# Patient Record
Sex: Female | Born: 1955 | Race: Black or African American | Hispanic: No | Smoking: Former smoker
Health system: Southern US, Community
[De-identification: ages and names within clinical notes are randomized; demographics above are authoritative.]

## PROBLEM LIST (undated history)

## (undated) DIAGNOSIS — B019 Varicella without complication: Secondary | ICD-10-CM

## (undated) DIAGNOSIS — I10 Essential (primary) hypertension: Secondary | ICD-10-CM

## (undated) DIAGNOSIS — C801 Malignant (primary) neoplasm, unspecified: Secondary | ICD-10-CM

## (undated) HISTORY — PX: FRACTURE SURGERY: SHX138

## (undated) HISTORY — PX: SPINE SURGERY: SHX786

## (undated) HISTORY — DX: Essential (primary) hypertension: I10

## (undated) HISTORY — DX: Varicella without complication: B01.9

## (undated) HISTORY — PX: PARTIAL HYSTERECTOMY: SHX80

## (undated) HISTORY — PX: OTHER SURGICAL HISTORY: SHX169

---

## 2015-11-16 DIAGNOSIS — S82832A Other fracture of upper and lower end of left fibula, initial encounter for closed fracture: Secondary | ICD-10-CM | POA: Diagnosis not present

## 2015-11-16 DIAGNOSIS — S8265XA Nondisplaced fracture of lateral malleolus of left fibula, initial encounter for closed fracture: Secondary | ICD-10-CM | POA: Diagnosis not present

## 2015-11-16 DIAGNOSIS — M25572 Pain in left ankle and joints of left foot: Secondary | ICD-10-CM | POA: Diagnosis not present

## 2015-11-16 DIAGNOSIS — M7989 Other specified soft tissue disorders: Secondary | ICD-10-CM | POA: Diagnosis not present

## 2015-11-16 DIAGNOSIS — S24151A Other incomplete lesion at T1 level of thoracic spinal cord, initial encounter: Secondary | ICD-10-CM | POA: Diagnosis not present

## 2015-12-04 DIAGNOSIS — S82445A Nondisplaced spiral fracture of shaft of left fibula, initial encounter for closed fracture: Secondary | ICD-10-CM | POA: Diagnosis not present

## 2015-12-04 DIAGNOSIS — M25472 Effusion, left ankle: Secondary | ICD-10-CM | POA: Diagnosis not present

## 2015-12-04 DIAGNOSIS — M25572 Pain in left ankle and joints of left foot: Secondary | ICD-10-CM | POA: Diagnosis not present

## 2015-12-04 DIAGNOSIS — S8265XD Nondisplaced fracture of lateral malleolus of left fibula, subsequent encounter for closed fracture with routine healing: Secondary | ICD-10-CM | POA: Diagnosis not present

## 2016-01-04 DIAGNOSIS — S82832D Other fracture of upper and lower end of left fibula, subsequent encounter for closed fracture with routine healing: Secondary | ICD-10-CM | POA: Diagnosis not present

## 2016-01-04 DIAGNOSIS — S82832A Other fracture of upper and lower end of left fibula, initial encounter for closed fracture: Secondary | ICD-10-CM | POA: Diagnosis not present

## 2016-01-04 DIAGNOSIS — S24151A Other incomplete lesion at T1 level of thoracic spinal cord, initial encounter: Secondary | ICD-10-CM | POA: Diagnosis not present

## 2016-01-10 DIAGNOSIS — N319 Neuromuscular dysfunction of bladder, unspecified: Secondary | ICD-10-CM | POA: Diagnosis not present

## 2016-01-10 DIAGNOSIS — R252 Cramp and spasm: Secondary | ICD-10-CM | POA: Diagnosis not present

## 2016-01-10 DIAGNOSIS — S24151A Other incomplete lesion at T1 level of thoracic spinal cord, initial encounter: Secondary | ICD-10-CM | POA: Diagnosis not present

## 2016-01-10 DIAGNOSIS — M792 Neuralgia and neuritis, unspecified: Secondary | ICD-10-CM | POA: Diagnosis not present

## 2017-04-02 DIAGNOSIS — R252 Cramp and spasm: Secondary | ICD-10-CM | POA: Diagnosis not present

## 2017-04-02 DIAGNOSIS — S24151A Other incomplete lesion at T1 level of thoracic spinal cord, initial encounter: Secondary | ICD-10-CM | POA: Diagnosis not present

## 2017-04-02 DIAGNOSIS — I10 Essential (primary) hypertension: Secondary | ICD-10-CM | POA: Diagnosis not present

## 2017-04-02 DIAGNOSIS — Z23 Encounter for immunization: Secondary | ICD-10-CM | POA: Diagnosis not present

## 2017-04-02 DIAGNOSIS — F321 Major depressive disorder, single episode, moderate: Secondary | ICD-10-CM | POA: Diagnosis not present

## 2017-04-02 DIAGNOSIS — M792 Neuralgia and neuritis, unspecified: Secondary | ICD-10-CM | POA: Diagnosis not present

## 2017-04-02 DIAGNOSIS — Z720 Tobacco use: Secondary | ICD-10-CM | POA: Diagnosis not present

## 2017-11-26 ENCOUNTER — Telehealth: Payer: Self-pay | Admitting: General Practice

## 2017-11-26 NOTE — Telephone Encounter (Signed)
The [facility] called asking if Dr. Yong Channel would still be able to see this patient, as the patient is Medicare and Dr. Yong Channel does not take Medicare patients unless by close family/ friend referral, or the patient is already his patient. I could not verify this, as Dr. Yong Channel and his nurse were not here. I advised the [facility] that as the appointment is tomorrow, we'll need to keep it.

## 2017-11-27 ENCOUNTER — Encounter: Payer: Self-pay | Admitting: Family Medicine

## 2017-11-27 ENCOUNTER — Ambulatory Visit (INDEPENDENT_AMBULATORY_CARE_PROVIDER_SITE_OTHER): Payer: Medicare Other | Admitting: Family Medicine

## 2017-11-27 VITALS — BP 148/92 | HR 95 | Temp 98.3°F | Ht 64.5 in | Wt 155.2 lb

## 2017-11-27 DIAGNOSIS — G8929 Other chronic pain: Secondary | ICD-10-CM | POA: Insufficient documentation

## 2017-11-27 DIAGNOSIS — F32A Depression, unspecified: Secondary | ICD-10-CM

## 2017-11-27 DIAGNOSIS — I10 Essential (primary) hypertension: Secondary | ICD-10-CM | POA: Diagnosis not present

## 2017-11-27 DIAGNOSIS — F331 Major depressive disorder, recurrent, moderate: Secondary | ICD-10-CM | POA: Insufficient documentation

## 2017-11-27 DIAGNOSIS — G8928 Other chronic postprocedural pain: Secondary | ICD-10-CM | POA: Diagnosis not present

## 2017-11-27 DIAGNOSIS — F329 Major depressive disorder, single episode, unspecified: Secondary | ICD-10-CM | POA: Diagnosis not present

## 2017-11-27 DIAGNOSIS — F325 Major depressive disorder, single episode, in full remission: Secondary | ICD-10-CM | POA: Insufficient documentation

## 2017-11-27 MED ORDER — BUPROPION HCL ER (XL) 150 MG PO TB24: 150.0000 mg | ORAL_TABLET | Freq: Every day | ORAL | 2 refills | Status: DC

## 2017-11-27 NOTE — Assessment & Plan Note (Signed)
Patient apparently has had several surgeries (need records) and back related injuries- she reports pain in her low back, neck, and right shoulder area where she reports she has a plate. 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 She feels "tight" waist down". Fatigue in legs with walking- has had falls in the past as not confident of her foot position.   She takes gabapentin 900 mg TID as well as baclofen 10mg  TID for her pain and muscle spasms.   Also has Arthritis- right hand. Right handed. PIP joint and MCP joint 5th finger. She asks about arthritis medicine- need to see records and kidney function at minimum

## 2017-11-27 NOTE — Patient Instructions (Addendum)
I advised Brianna Saunders to take her medicine for blood pressure daily and then come back for repeat blood pressure check in 2-3 weeks to make sure she is at goal on medicine.   We will also start wellbutrin (take each morning) to try to help with depression and help her quit smoking. I sent this to your pharmacy  It is going to take [place] several visits most likely to get all of your information in and get all your health maintenance needs up to date- we are going to do our best to take great care of you  At front desk 1. Schedule 2-3 week follow up 2. Have them help you sign a release of information from your last doctor in [place] for last 5 years of records  Also verbally discussed if any thoughts of self harm to contact us or 911 immediately.

## 2017-11-27 NOTE — Progress Notes (Signed)
Phone: [phone]  Subjective:  Patient presents today to establish care. Was seeing Dr. Jimmye Norman from [facility] reportedly Chief complaint-noted.   See problem oriented charting  The following were reviewed and entered/updated in epic: Past Medical History:  Diagnosis Date  . Chicken pox   . Depression    for most part does well on celexa 40mg . still has intermittent periods when she feels down.   Marland Kitchen Hypertension    Patient Active Problem List   Diagnosis Date Noted  . Hypertension 11/27/2017  . Chronic pain 11/27/2017  . Depression    Past Surgical History:  Procedure Laterality Date  . FRACTURE SURGERY    . SPINE SURGERY     lumbar spine- around 2014  . upper back surgery     states plate in neck and into right shoulder blade reportedly- around 2014   Family History  Problem Relation Age of Onset  . Hypertension Mother   . Heart attack Mother        9  . Hypertension Father   . Kidney disease Father        dialysis  . Kidney disease Brother        dialysis  . Heart attack Maternal Grandmother   . Early death Sister        died at 71 months    Medications- reviewed and updated Current Outpatient Medications  Medication Sig Dispense Refill  . amLODipine (NORVASC) 10 MG tablet Take 10 mg by mouth daily.    . baclofen (LIORESAL) 10 MG tablet Take 10 mg by mouth 3 (three) times daily.    . Cholecalciferol (VITAMIN D3) 50000 units TABS Take 1 tablet by mouth once a week.    . citalopram (CELEXA) 40 MG tablet Take 40 mg by mouth daily.    Marland Kitchen gabapentin (NEURONTIN) 300 MG capsule Take 900 mg by mouth 3 (three) times daily.    Marland Kitchen lisinopril (PRINIVIL,ZESTRIL) 40 MG tablet Take 40 mg by mouth daily.    . metoprolol tartrate (LOPRESSOR) 50 MG tablet Take 50 mg by mouth 2 (two) times daily.     No current facility-administered medications for this visit.     Allergies-reviewed and updated No Known Allergies  Social History   Socioeconomic History  . Marital  status: Unknown    Spouse name: None  . Number of children: None  . Years of education: None  . Highest education level: None  Social Needs  . Financial resource strain: None  . Food insecurity - worry: None  . Food insecurity - inability: None  . Transportation needs - medical: None  . Transportation needs - non-medical: None  Occupational History  . None  Tobacco Use  . Smoking status: Current Every Day Smoker    Packs/day: 0.50    Years: 45.00    Pack years: 22.50    Types: Cigarettes    Start date: 11/11/1969  . Smokeless tobacco: Never Used  Substance and Sexual Activity  . Alcohol use: No    Frequency: Never  . Drug use: No  . Sexual activity: No  Other Topics Concern  . None  Social History Narrative   LIves with her son and granddaughter- now 51 (often with patient). Also has 1 cat.    Moved to [place] from [place]- slightly closer to family.    Does cleaning/cooking      Disabled due to worsening back/surgeries. 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 Wear and tear from job.    12th  grade education      Hobbies: solitaire on laptop, enjoys doing puzzles on the computer   ROS--Full ROS was completed Review of Systems  Constitutional: Negative for chills and fever.  HENT: Negative for hearing loss and tinnitus.   Eyes: Negative for blurred vision and double vision.  Respiratory: Negative for cough and hemoptysis.   Cardiovascular: Negative for chest pain and palpitations.  Gastrointestinal: Negative for abdominal pain and vomiting.  Genitourinary: Negative for dysuria and urgency.  Musculoskeletal: Positive for back pain, joint pain, myalgias and neck pain.  Skin: Negative for itching and rash.  Neurological: Positive for tingling (states mid abdomen down), sensory change (mid abdomen down) and focal weakness (reports weakness in both legs).  Endo/Heme/Allergies: Negative for polydipsia. Does not bruise/bleed easily.  Psychiatric/Behavioral:  Positive for depression. Negative for substance abuse and suicidal ideas.   Objective: BP (!) 148/92   Pulse 95   Temp 98.3 F (36.8 C) (Oral)   Ht 5' 4.5" (1.638 m)   Wt 155 lb 3.2 oz (70.4 kg)   SpO2 98%   BMI 26.23 kg/m  Gen: NAD, resting comfortably in chair, smells of smoke HEENT: Mucous membranes are moist. Oropharynx normal. TM normal. Eyes: sclera and lids normal Neck: no thyromegaly, no cervical lymphadenopathy CV: RRR no murmurs rubs or gallops Lungs: CTAB no crackles, wheeze, rhonchi Abdomen: soft/nontender/nondistended/normal bowel sounds. No rebound or guarding.  Ext: no edema Msk: reports pain with palpation over right scapula. Midline pain over area that bulges up slightly from spinal column- she reports this as a plate Skin: warm, dry Neuro: walks with walker. States only feels it lightly when I touch her legs. 4/5 strength on left lower leg and 4+/5 strength in right lower leg. PERRLA  Assessment/Plan:  Hypertension S: controlled poorly on no medications today. Usually takes Amlodipine 10mg , lisinopril 40mg , metoprolol 50mg  BID but admits this is intermittent BP Readings from Last 3 Encounters:  11/27/17 (!) 148/92  A/P: We discussed blood pressure goal of <140/90. I advised Ms. Sidman to take her medicine for blood pressure daily and then come back for repeat blood pressure check in 2-3 weeks to make sure she is at goal on medicine.    Depression S: for most part feels she does well on celexa 40mg . still has several periods every month when she feels down. Her disability is hard on her as well as not having best relationship with oldest 3 of her 5 children.   PHQ9 of 11 today. In addition desires to quit smoking. No reported seizure history A/P: continue celexa 40mg , add wellbutrin 150mg  XR and follow up in 2-3 weeks  Chronic pain Patient apparently has had several surgeries (need records) and back related injuries- she reports pain in her low back, neck, and  right shoulder area where she reports she has a plate. 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 She feels "tight" waist down". Fatigue in legs with walking- has had falls in the past as not confident of her foot position.   She takes gabapentin 900 mg TID as well as baclofen 10mg  TID for her pain and muscle spasms.   Also has Arthritis- right hand. Right handed. PIP joint and MCP joint 5th finger. She asks about arthritis medicine- need to see records and kidney function at minimum  Future Appointments  Date Time Provider [place]  12/18/2017  2:15 PM Marin Olp, MD [facility]  sign ROI and hopeful for some records by  next visit   Meds ordered this encounter  Medications  . buPROPion (WELLBUTRIN XL) 150 MG 24 hr tablet    Sig: Take 1 tablet (150 mg total) by mouth daily. Take one tablet by mouth every morning    Dispense:  30 tablet    Refill:  2   Return precautions advised. Garret Reddish, MD

## 2017-11-27 NOTE — Assessment & Plan Note (Signed)
S: for most part feels she does well on celexa 40mg . still has several periods every month when she feels down. Her disability is hard on her as well as not having best relationship with oldest 3 of her 5 children.   PHQ9 of 11 today. In addition desires to quit smoking. No reported seizure history A/P: continue celexa 40mg , add wellbutrin 150mg  XR and follow up in 2-3 weeks

## 2017-11-27 NOTE — Assessment & Plan Note (Signed)
S: controlled poorly on no medications today. Usually takes Amlodipine 10mg , lisinopril 40mg , metoprolol 50mg  BID but admits this is intermittent BP Readings from Last 3 Encounters:  11/27/17 (!) 148/92  A/P: We discussed blood pressure goal of <140/90. I advised [name] to take her medicine for blood pressure daily and then come back for repeat blood pressure check in 2-3 weeks to make sure she is at goal on medicine.

## 2017-11-29 ENCOUNTER — Telehealth: Payer: Self-pay | Admitting: Family Medicine

## 2017-11-29 NOTE — Telephone Encounter (Signed)
Review of records from [name]. Patient was actually a patient of Dr. Wonda Amis of [facility].   To Do: 1. Brianna Saunders (this may be  More of a Lea question)- she does not have a care everywhere button on epic and no records from [facility] are showing up- can you look into this or ask someone with Epic why I do no thave access to this?  Records review 1. 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 Hx of cervical myelopathy s/p cervical decompressionand fusion as well as lumbar spinal stenosis, s/p lumbar laminectomy In 2014. 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 Also, patient had acute fracture of her left fibula in January 2017. She is wondering if there were any vitamins that she could take to help with the pain.  She reports today that her pain is severe. It is in her extremities and abdomen. Described as tight, sensitive, and cramping. "  She had her gabapentin increased to 900mg  TID at that visit. She was also on baclofen at that visit 10mg  TID  Appears int he past she was on hydrocodone for the fibula fracture. She was treated by [facility]  From first visit 09/14/15 noted that "patient is veyr poor historian. She cannot tell me what the diagnosis is or the name of any procedures she had tried. " apparently she had been cared for in [place] but had to "get away" Prior seen by Dr. Estrella Myrtle with PMR at [facility] in [place]" She had lived in [place] previously. Even back to 09/2015 complained of upper back and nerve pain of the left arm. She told Dr. Maricela Bo that something happened to her and that is when all her symptoms started but she cannot describe what it was exactly except that it was very traumatic.   3. From 04/02/17  She reported poorly controlled depression and celexa was increased  from 20mg  to 40mg  - appears also in the past on buspar in the past 4.  Health Maintenance Due  Topic Date Due  . Hepatitis C Screening - no record [birth year]  . HIV Screening - no record 07/13/1970  . TETANUS/TDAP - no record 07/13/1974  . PAP SMEAR - no record, may refer potentially 07/13/1976  . MAMMOGRAM  - no record 07/13/2005  . COLONOSCOPY - nor record 07/13/2005  5. Consider asking about education level- at visit I was not sure patient could ready when I gave her PHQ9 form and she needed assistance.

## 2017-12-11 ENCOUNTER — Other Ambulatory Visit: Payer: Self-pay

## 2017-12-11 MED ORDER — GABAPENTIN 300 MG PO CAPS: 900.0000 mg | ORAL_CAPSULE | Freq: Three times a day (TID) | ORAL | 2 refills | Status: DC

## 2017-12-18 ENCOUNTER — Encounter: Payer: Self-pay | Admitting: *Deleted

## 2017-12-18 ENCOUNTER — Encounter: Payer: Self-pay | Admitting: Family Medicine

## 2017-12-18 ENCOUNTER — Ambulatory Visit (INDEPENDENT_AMBULATORY_CARE_PROVIDER_SITE_OTHER): Payer: Medicare Other | Admitting: Family Medicine

## 2017-12-18 ENCOUNTER — Ambulatory Visit (INDEPENDENT_AMBULATORY_CARE_PROVIDER_SITE_OTHER): Payer: Medicare Other | Admitting: *Deleted

## 2017-12-18 VITALS — BP 126/72 | HR 88 | Temp 98.4°F | Ht 64.5 in | Wt 159.2 lb

## 2017-12-18 VITALS — BP 126/72 | Ht 64.5 in | Wt 159.0 lb

## 2017-12-18 DIAGNOSIS — Z1159 Encounter for screening for other viral diseases: Secondary | ICD-10-CM

## 2017-12-18 DIAGNOSIS — Z1211 Encounter for screening for malignant neoplasm of colon: Secondary | ICD-10-CM

## 2017-12-18 DIAGNOSIS — Z0000 Encounter for general adult medical examination without abnormal findings: Secondary | ICD-10-CM

## 2017-12-18 DIAGNOSIS — F17200 Nicotine dependence, unspecified, uncomplicated: Secondary | ICD-10-CM | POA: Insufficient documentation

## 2017-12-18 DIAGNOSIS — G8928 Other chronic postprocedural pain: Secondary | ICD-10-CM | POA: Diagnosis not present

## 2017-12-18 DIAGNOSIS — F324 Major depressive disorder, single episode, in partial remission: Secondary | ICD-10-CM | POA: Diagnosis not present

## 2017-12-18 DIAGNOSIS — F17210 Nicotine dependence, cigarettes, uncomplicated: Secondary | ICD-10-CM | POA: Diagnosis not present

## 2017-12-18 DIAGNOSIS — Z1231 Encounter for screening mammogram for malignant neoplasm of breast: Secondary | ICD-10-CM

## 2017-12-18 DIAGNOSIS — I10 Essential (primary) hypertension: Secondary | ICD-10-CM

## 2017-12-18 MED ORDER — BUPROPION HCL ER (XL) 150 MG PO TB24: 300.0000 mg | ORAL_TABLET | Freq: Every day | ORAL | 5 refills | Status: DC

## 2017-12-18 MED ORDER — LISINOPRIL 40 MG PO TABS: 40.0000 mg | ORAL_TABLET | Freq: Every day | ORAL | 1 refills | Status: DC

## 2017-12-18 MED ORDER — GABAPENTIN 300 MG PO CAPS: 900.0000 mg | ORAL_CAPSULE | Freq: Three times a day (TID) | ORAL | 1 refills | Status: DC

## 2017-12-18 MED ORDER — AMLODIPINE BESYLATE 10 MG PO TABS: 10.0000 mg | ORAL_TABLET | Freq: Every day | ORAL | 1 refills | Status: DC

## 2017-12-18 MED ORDER — METOPROLOL TARTRATE 50 MG PO TABS: 50.0000 mg | ORAL_TABLET | Freq: Two times a day (BID) | ORAL | 1 refills | Status: DC

## 2017-12-18 MED ORDER — BACLOFEN 10 MG PO TABS: 10.0000 mg | ORAL_TABLET | Freq: Three times a day (TID) | ORAL | 5 refills | Status: DC | PRN

## 2017-12-18 NOTE — Patient Instructions (Addendum)
Brianna Saunders , Thank you for taking time to come for your Medicare Wellness Visit. I appreciate your ongoing commitment to your health goals. Please review the following plan we discussed and let me know if I can assist you in the future.   Medicare now request all "baby boomers" test for possible exposure to Hepatitis C. Many may have been exposed due to dental work, tatoo's, vaccinations when young. The Hepatitis C virus is dormant for many years and then sometimes will cause liver cancer. If you gave blood in the past 15 years, you were most likely checked for Hep C. If you rec'd blood; you may want to consider testing or if you are high risk for any other reason.  Will draw at the next blood draw  [name] will schedule a [place] [facility] [place]  [name] will complete referral for the colo guard  You will get a call and they will review the simple procedure  [employer] will drop at your home and you will change the address and send it back  U[employer] can pick it up as well  Dr. Yong Channel will fup on your other labs and pap and HIV screen at your apt.  [facility] [employer]; ([phone] [phone] -  Call and speak with Jerene Canny, ask her about transportation around [place]; [phone] Get resource to get information on any and all community programs for Seniors  Community solutions; "Aging Gracefully In Place" program; can request or apply  [employer]: (680) 768-4357 Community Health Response Program -086-761-9509 Public Health Dept; Need to be a skilled visit but can assist with bathing as well; 418-580-3205   Help with Rx at [place]  Monday - Friday 8am to 10pm EST Sat- Sunday 9am to 7pm Patient help line [phone] Email support online at [employer]gl  Dept of Social Services; Call (416)184-7035 and ask for SW on call  Options for Medicaid include the [employer] program; [facility] (personal care services) or PACE program, which is a medical and  social program combined    [employer] general resources for food etc    Resource to find disability equipment (used) online; https://bradley.com/       These are the goals we discussed: Goals    None      This is a list of the screening recommended for you and due dates:  Health Maintenance  Topic Date Due  .  Hepatitis C: One time screening is recommended by Center for Disease Control  (CDC) for  adults born from [birth year] through 1966.   [birth year]  . HIV Screening  07/13/62  . Pap Smear  07/13/62  . Mammogram  07/13/62  . Colon Cancer Screening  07/13/62  . Flu Shot  07/30/2018*  . Tetanus Vaccine  12/18/2018*  *Topic was postponed. The date shown is not the original due date.    Fall Prevention in the Home Falls can cause injuries and can affect people from all age groups. There are many simple things that you can do to make your home safe and to help prevent falls. What can I do on the outside of my home?  Regularly repair the edges of walkways and driveways and fix any cracks.  Remove high doorway thresholds.  Trim any shrubbery on the main path into your home.  Use bright outdoor lighting.  Clear walkways of debris and clutter, including tools and rocks.  Regularly check that handrails are securely fastened and in good repair. Both sides of any steps should have handrails.  Install guardrails along the edges of any raised decks or porches.  Have leaves, snow, and ice cleared regularly.  Use sand or salt on walkways during winter months.  In the garage, clean up any spills right away, including grease or oil spills. What can I do in the bathroom?  Use night lights.  Install grab bars by the toilet and in the tub and shower. Do not use towel bars as grab bars.  Use non-skid mats or decals on the floor of the tub or shower.  If you need to sit down while you are in the shower, use a plastic, non-slip  stool.  Keep the floor dry. Immediately clean up any water that spills on the floor.  Remove soap buildup in the tub or shower on a regular basis.  Attach bath mats securely with double-sided non-slip rug tape.  Remove throw rugs and other tripping hazards from the floor. What can I do in the bedroom?  Use night lights.  Make sure that a bedside light is easy to reach.  Do not use oversized bedding that drapes onto the floor.  Have a firm chair that has side arms to use for getting dressed.  Remove throw rugs and other tripping hazards from the floor. What can I do in the kitchen?  Clean up any spills right away.  Avoid walking on wet floors.  Place frequently used items in easy-to-reach places.  If you need to reach for something above you, use a sturdy step stool that has a grab bar.  Keep electrical cables out of the way.  Do not use floor polish or wax that makes floors slippery. If you have to use wax, make sure that it is non-skid floor wax.  Remove throw rugs and other tripping hazards from the floor. What can I do in the stairways?  Do not leave any items on the stairs.  Make sure that there are handrails on both sides of the stairs. Fix handrails that are broken or loose. Make sure that handrails are as long as the stairways.  Check any carpeting to make sure that it is firmly attached to the stairs. Fix any carpet that is loose or worn.  Avoid having throw rugs at the top or bottom of stairways, or secure the rugs with carpet tape to prevent them from moving.  Make sure that you have a light switch at the top of the stairs and the bottom of the stairs. If you do not have them, have them installed. What are some other fall prevention tips?  Wear closed-toe shoes that fit well and support your feet. Wear shoes that have rubber soles or low heels.  When you use a stepladder, make sure that it is completely opened and that the sides are firmly locked. Have  someone hold the ladder while you are using it. Do not climb a closed stepladder.  Add color or contrast paint or tape to grab bars and handrails in your home. Place contrasting color strips on the first and last steps.  Use mobility aids as needed, such as canes, walkers, scooters, and crutches.  Turn on lights if it is dark. Replace any light bulbs that burn out.  Set up furniture so that there are clear paths. Keep the furniture in the same spot.  Fix any uneven floor surfaces.  Choose a carpet design that does not hide the edge of steps of a stairway.  Be aware of any and all pets.  Review your  medicines with your healthcare provider. Some medicines can cause dizziness or changes in blood pressure, which increase your risk of falling. Talk with your health care provider about other ways that you can decrease your risk of falls. This may include working with a physical therapist or trainer to improve your strength, balance, and endurance. This information is not intended to replace advice given to you by your health care provider. Make sure you discuss any questions you have with your health care provider. Document Released: 10/18/2002 Document Revised: 03/26/2016 Document Reviewed: 12/02/2014 Elsevier Interactive Patient Education  2018 [place] Maintenance for Postmenopausal Women Menopause is a normal process in which your reproductive ability comes to an end. This process happens gradually over a span of months to years, usually between the ages of 8 and 26. Menopause is complete when you have missed 12 consecutive menstrual periods. It is important to talk with your health care provider about some of the most common conditions that affect postmenopausal women, such as heart disease, cancer, and bone loss (osteoporosis). Adopting a healthy lifestyle and getting preventive care can help to promote your health and wellness. Those actions can also lower your chances of  developing some of these common conditions. What should I know about menopause? During menopause, you may experience a number of symptoms, such as:  Moderate-to-severe hot flashes.  Night sweats.  Decrease in sex drive.  Mood swings.  Headaches.  Tiredness.  Irritability.  Memory problems.  Insomnia.  Choosing to treat or not to treat menopausal changes is an individual decision that you make with your health care provider. What should I know about hormone replacement therapy and supplements? Hormone therapy products are effective for treating symptoms that are associated with menopause, such as hot flashes and night sweats. Hormone replacement carries certain risks, especially as you become older. If you are thinking about using estrogen or estrogen with progestin treatments, discuss the benefits and risks with your health care provider. What should I know about heart disease and stroke? Heart disease, heart attack, and stroke become more likely as you age. This may be due, in part, to the hormonal changes that your body experiences during menopause. These can affect how your body processes dietary fats, triglycerides, and cholesterol. Heart attack and stroke are both medical emergencies. There are many things that you can do to help prevent heart disease and stroke:  Have your blood pressure checked at least every 1-2 years. High blood pressure causes heart disease and increases the risk of stroke.  If you are 40-17 years old, ask your health care provider if you should take aspirin to prevent a heart attack or a stroke.  Do not use any tobacco products, including cigarettes, chewing tobacco, or electronic cigarettes. If you need help quitting, ask your health care provider.  It is important to eat a healthy diet and maintain a healthy weight. ? Be sure to include plenty of vegetables, fruits, low-fat dairy products, and lean protein. ? Avoid eating foods that are high in solid  fats, added sugars, or salt (sodium).  Get regular exercise. This is one of the most important things that you can do for your health. ? Try to exercise for at least 150 minutes each week. The type of exercise that you do should increase your heart rate and make you sweat. This is known as moderate-intensity exercise. ? Try to do strengthening exercises at least twice each week. Do these in addition to the moderate-intensity exercise.  Know your numbers.Ask your health care provider to check your cholesterol and your blood glucose. Continue to have your blood tested as directed by your health care provider.  What should I know about cancer screening? There are several types of cancer. Take the following steps to reduce your risk and to catch any cancer development as early as possible. Breast Cancer  Practice breast self-awareness. ? This means understanding how your breasts normally appear and feel. ? It also means doing regular breast self-exams. Let your health care provider know about any changes, no matter how small.  If you are 37 or older, have a clinician do a breast exam (clinical breast exam or CBE) every year. Depending on your age, family history, and medical history, it may be recommended that you also have a yearly breast X-ray (mammogram).  If you have a family history of breast cancer, talk with your health care provider about genetic screening.  If you are at high risk for breast cancer, talk with your health care provider about having an MRI and a mammogram every year.  Breast cancer (BRCA) gene test is recommended for women who have family members with BRCA-related cancers. Results of the assessment will determine the need for genetic counseling and BRCA1 and for BRCA2 testing. BRCA-related cancers include these types: ? Breast. This occurs in males or females. ? Ovarian. ? Tubal. This may also be called fallopian tube cancer. ? Cancer of the abdominal or pelvic lining  (peritoneal cancer). ? Prostate. ? Pancreatic.  Cervical, Uterine, and Ovarian Cancer Your health care provider may recommend that you be screened regularly for cancer of the pelvic organs. These include your ovaries, uterus, and vagina. This screening involves a pelvic exam, which includes checking for microscopic changes to the surface of your cervix (Pap test).  For women ages 21-65, health care providers may recommend a pelvic exam and a Pap test every three years. For women ages 59-65, they may recommend the Pap test and pelvic exam, combined with testing for human papilloma virus (HPV), every five years. Some types of HPV increase your risk of cervical cancer. Testing for HPV may also be done on women of any age who have unclear Pap test results.  Other health care providers may not recommend any screening for nonpregnant women who are considered low risk for pelvic cancer and have no symptoms. Ask your health care provider if a screening pelvic exam is right for you.  If you have had past treatment for cervical cancer or a condition that could lead to cancer, you need Pap tests and screening for cancer for at least 20 years after your treatment. If Pap tests have been discontinued for you, your risk factors (such as having a new sexual partner) need to be reassessed to determine if you should start having screenings again. Some women have medical problems that increase the chance of getting cervical cancer. In these cases, your health care provider may recommend that you have screening and Pap tests more often.  If you have a family history of uterine cancer or ovarian cancer, talk with your health care provider about genetic screening.  If you have vaginal bleeding after reaching menopause, tell your health care provider.  There are currently no reliable tests available to screen for ovarian cancer.  Lung Cancer Lung cancer screening is recommended for adults 36-39 years old who are at  high risk for lung cancer because of a history of smoking. A yearly low-dose CT scan of the  lungs is recommended if you:  Currently smoke.  Have a history of at least 30 pack-years of smoking and you currently smoke or have quit within the past 15 years. A pack-year is smoking an average of one pack of cigarettes per day for one year.  Yearly screening should:  Continue until it has been 15 years since you quit.  Stop if you develop a health problem that would prevent you from having lung cancer treatment.  Colorectal Cancer  This type of cancer can be detected and can often be prevented.  Routine colorectal cancer screening usually begins at age 39 and continues through age 14.  If you have risk factors for colon cancer, your health care provider may recommend that you be screened at an earlier age.  If you have a family history of colorectal cancer, talk with your health care provider about genetic screening.  Your health care provider may also recommend using home test kits to check for hidden blood in your stool.  A small camera at the end of a tube can be used to examine your colon directly (sigmoidoscopy or colonoscopy). This is done to check for the earliest forms of colorectal cancer.  Direct examination of the colon should be repeated every 5-10 years until age 24. However, if early forms of precancerous polyps or small growths are found or if you have a family history or genetic risk for colorectal cancer, you may need to be screened more often.  Skin Cancer  Check your skin from head to toe regularly.  Monitor any moles. Be sure to tell your health care provider: ? About any new moles or changes in moles, especially if there is a change in a mole's shape or color. ? If you have a mole that is larger than the size of a pencil eraser.  If any of your family members has a history of skin cancer, especially at a young age, talk with your health care provider about genetic  screening.  Always use sunscreen. Apply sunscreen liberally and repeatedly throughout the day.  Whenever you are outside, protect yourself by wearing long sleeves, pants, a wide-brimmed hat, and sunglasses.  What should I know about osteoporosis? Osteoporosis is a condition in which bone destruction happens more quickly than new bone creation. After menopause, you may be at an increased risk for osteoporosis. To help prevent osteoporosis or the bone fractures that can happen because of osteoporosis, the following is recommended:  If you are 19-67 years old, get at least 1,000 mg of calcium and at least 600 mg of vitamin D per day.  If you are older than age 84 but younger than age 74, get at least 1,200 mg of calcium and at least 600 mg of vitamin D per day.  If you are older than age 70, get at least 1,200 mg of calcium and at least 800 mg of vitamin D per day.  Smoking and excessive alcohol intake increase the risk of osteoporosis. Eat foods that are rich in calcium and vitamin D, and do weight-bearing exercises several times each week as directed by your health care provider. What should I know about how menopause affects my mental health? Depression may occur at any age, but it is more common as you become older. Common symptoms of depression include:  Low or sad mood.  Changes in sleep patterns.  Changes in appetite or eating patterns.  Feeling an overall lack of motivation or enjoyment of activities that you previously enjoyed.  Frequent crying spells.  Talk with your health care provider if you think that you are experiencing depression. What should I know about immunizations? It is important that you get and maintain your immunizations. These include:  Tetanus, diphtheria, and pertussis (Tdap) booster vaccine.  Influenza every year before the flu season begins.  Pneumonia vaccine.  Shingles vaccine.  Your health care provider may also recommend other  immunizations. This information is not intended to replace advice given to you by your health care provider. Make sure you discuss any questions you have with your health care provider. Document Released: 12/20/2005 Document Revised: 05/17/2016 Document Reviewed: 08/01/2015 Elsevier Interactive Patient Education  2018 [employer].

## 2017-12-18 NOTE — Assessment & Plan Note (Signed)
S: Today we discussed possibly using 800mg  gabapentin TID- but she states she had been on that but the 900 mg TID has been more effective. She reports lower legs are cold all the time since this started several years ago while upper body is normal temperature. She also has incontinence and wears pads   records reviewed from [facility] from 10/20/15 ". Back Pain  lower back pain radiates into bilateral legs, patient describes legs as feeling cold & heavy.   Back Pain  This is a chronic problem. Episode onset: onset approximately 2012 to 2014. The pain is at a severity of 9/10. Associated symptoms include bladder incontinence, bowel incontinence, leg pain, numbness, paresis, pelvic pain, perianal numbness and weakness. Pertinent negatives include no abdominal pain, chest pain, dysuria, fever, headaches, paresthesias or weight loss. She has tried analgesics, NSAIDs and heat for the symptoms. The treatment provided no relief.   Mrs. Voigt is seen in the office today for evaluation of chronic diffuse pain with a complex medical history.  Patient is a very poor historian and essentially all the information is obtained from extensive records from [facility] presented today in a packet and reviewed subsequently. She is able to tell me about one cervical surgery that she had but she does not remember that she had a second surgery for a laminectomy of the low back in December 2014.   She gives a very poor history but reports some type of spontaneous problems involving her arms approximately 4 years ago. Records indicate February 12, 2013 she did undergo an MRI scan of the cervical, thoracic and lumbar spine.  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 There was abnormal signal extending throughout the central cord from C5-6, C6-7, C7-T1.  Multilevel uncovertebral and ligamentum flavum hypertrophy throughout cervical spine was most severe at C6-7. There was grade 1 anterolisthesis of C7 on T1. Mild broad-based disc protrusions were present at T5-6 and T6-7. Spinal stenosis was also prominent in the lumbar region at L3-L4 and L4-L5 due to a combination of degenerative disc disease and facet arthropathy along with thickening of the ligamenta flava.  Patient subsequently underwent anterior cervical discectomy and fusion C6-T1 approximately February 13, 2013 with C7 laminectomy.   Most recent cervical spine MRI study after fusion February 14, 2013 showed status post interval posterior fusion of C6-T1 with C7 laminectomy and an improvement in alignment. There was no evidence of immediate complication.  Although not remembered by the patient she has subsequently undergone a L4 laminectomy and decompression including resection of a right L4-5 synovial cyst October 11, 2013.  Patient now complains of pain throughout her body in numerous areas. She has been previously treated with combination of baclofen for spasticity and along with gabapentin for chronic neurogenic pain.  Patient reports having moved to [place] from [place] October 2016. She is only seen her primary care physician here and was sent here for further evaluation regarding her diffuse back neck and body pain.   Presently the patient remains on baclofen and gabapentin. She has evidence of spasticity, specifically increased clonus and hyperreflexia of the lower extremities.  At the present time we will try a short course of therapies although I'm not entirely optimistic that this will be beneficial for her considering that she has persistent incomplete spinal cord injury.  I'll plan to see  her in follow-up in approximately 4-6 weeks' time and we will at least have had a chance to review her medical records ." A/P: with complexity of prior MRI-- we discussed patient may very well  just have chronic pain from this point going forward. She gets osme control with gabapentin 900mg  TID and baclofen so we will continue

## 2017-12-18 NOTE — Assessment & Plan Note (Signed)
S: controlled on amlodipine 10mg , lisinopril 40mg , metoprolol 50mg  BID- last visit she had missed some doses and we discussed importance of taking medication regularly BP Readings from Last 3 Encounters:  12/18/17 126/72  11/27/17 (!) 148/92  A/P: We discussed blood pressure goal of <140/90. Continue current meds:  As now at goal

## 2017-12-18 NOTE — Assessment & Plan Note (Signed)
S: down to 2 cigarettes a day. Using wellbutring 3x a day- A/P: I advised 300mg  per day instead of 450mg  wellbutrin- she agrees to cut back. Thrilled she has cut down and encouraged permanent cessation of all cigarettes. Approximately 4 minutes spent in counseling.

## 2017-12-18 NOTE — Progress Notes (Signed)
I have reviewed and agree with note, evaluation, plan. Patient on medication for depression- sad she declined counseling as suspect it can help- her disability and chronic pain are big drivers for her feelings. Obviously has some ups and downs as phq2 was 1 just 2 weeks ago. I love the idea of getting her more involved with senior programs  Garret Reddish, MD

## 2017-12-18 NOTE — Progress Notes (Addendum)
Subjective:  Brianna Saunders is a 62 y.o. year old very pleasant female patient who presents for/with See problem oriented charting ROS- no fever, chills. Has some incontinence.deals with chronic pain in several areas.    Past Medical History-  Patient Active Problem List   Diagnosis Date Noted  . Hypertension 11/27/2017    Priority: Medium  . Depression, major, single episode, in partial remission (Brianna Saunders)     Priority: Medium  . Tobacco abuse 12/18/2017  . Chronic pain 11/27/2017    Medications- reviewed and updated Current Outpatient Medications  Medication Sig Dispense Refill  . amLODipine (NORVASC) 10 MG tablet Take 10 mg by mouth daily.    . baclofen (LIORESAL) 10 MG tablet Take 10 mg by mouth 3 (three) times daily.    Marland Kitchen buPROPion (WELLBUTRIN XL) 150 MG 24 hr tablet Take 1 tablet (150 mg total) by mouth daily. Take one tablet by mouth every morning 30 tablet 2  . Cholecalciferol (VITAMIN D3) 50000 units TABS Take 1 tablet by mouth once a week.    . citalopram (CELEXA) 40 MG tablet Take 40 mg by mouth daily.    Marland Kitchen gabapentin (NEURONTIN) 300 MG capsule Take 3 capsules (900 mg total) by mouth 3 (three) times daily. 90 capsule 2  . lisinopril (PRINIVIL,ZESTRIL) 40 MG tablet Take 40 mg by mouth daily.    . metoprolol tartrate (LOPRESSOR) 50 MG tablet Take 50 mg by mouth 2 (two) times daily.     Objective: BP 126/72 (BP Location: Left Arm, Patient Position: Sitting, Cuff Size: Normal)   Pulse 88   Temp 98.4 F (36.9 C) (Oral)   Ht 5' 4.5" (1.638 m)   Wt 159 lb 4 oz (72.2 kg)   SpO2 98%   BMI 26.91 kg/m  Gen: NAD, resting comfortably CV: RRR no murmurs rubs or gallops Lungs: CTAB no crackles, wheeze, rhonchi Ext: no edema Skin: warm, dry Neuro: walks with walker  Assessment/Plan:  Hypertension S: controlled on amlodipine 10mg , lisinopril 40mg , metoprolol 50mg  BID- last visit she had missed some doses and we discussed importance of taking medication regularly BP  Readings from Last 3 Encounters:  12/18/17 126/72  11/27/17 (!) 148/92  A/P: We discussed blood pressure goal of <140/90. Continue current meds:  As now at goal  Depression, major, single episode, in partial remission (Brianna Saunders) S: PHQ9 elevated today compared to last visit- she has her ups and downs due to her chronic pain and wanting to be able to be working like she used to before she started with her back issues.   She is on celexa 40mg  and wellbutrin 150mg  tablets XR- unfortunately she just started taking 3 on her own to help her with quitting smoking as she had read something about this on the internet.  Depression screen [facility] 2/9 12/18/2017 11/27/2017  Decreased Interest 2 0  Down, Depressed, Hopeless 2 1  PHQ - 2 Score 4 1  Altered sleeping 0 -  Tired, decreased energy 0 -  Change in appetite 1 -  Feeling bad or failure about yourself  3 -  Trouble concentrating 0 -  Moving slowly or fidgety/restless 0 -  Suicidal thoughts 0 -  PHQ-9 Score 8 -  Difficult doing work/chores Somewhat difficult -  A/P: continue celexa 40mg . Asked her to limit wellbutrin to 2 a day instead of 3 she has been taking. We will reevaluate with pHQ9 at follow up when we do her pap smear. She feels bad about herself because she cannot  work though she knows this is not her fault but instead due to chronic pain.   Chronic pain S: Today we discussed possibly using 800mg  gabapentin TID- but she states she had been on that but the 900 mg TID has been more effective. She reports lower legs are cold all the time since this started several years ago while upper body is normal temperature. She also has incontinence and wears pads   records reviewed from [facility] from 10/20/15 ". Back Pain  lower back pain radiates into bilateral legs, patient describes legs as feeling cold & heavy.   Back Pain  This is a chronic problem. Episode onset: onset approximately 2012 to 2014. The pain is at a severity of 9/10. Associated  symptoms include bladder incontinence, bowel incontinence, leg pain, numbness, paresis, pelvic pain, perianal numbness and weakness. Pertinent negatives include no abdominal pain, chest pain, dysuria, fever, headaches, paresthesias or weight loss. She has tried analgesics, NSAIDs and heat for the symptoms. The treatment provided no relief.   Brianna Saunders is seen in the office today for evaluation of chronic diffuse pain with a complex medical history.  Patient is a very poor historian and essentially all the information is obtained from extensive records from [facility] presented today in a packet and reviewed subsequently. She is able to tell me about one cervical surgery that she had but she does not remember that she had a second surgery for a laminectomy of the low back in December 2014.   She gives a very poor history but reports some type of spontaneous problems involving her arms approximately 4 years ago. Records indicate February 12, 2013 she did undergo an MRI scan of the cervical, thoracic and lumbar spine.  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 There was abnormal signal extending throughout the central cord from C5-6, C6-7, C7-T1. Multilevel uncovertebral and ligamentum flavum hypertrophy throughout cervical spine was most severe at C6-7. There was grade 1 anterolisthesis of C7 on T1. Mild broad-based disc protrusions were present at T5-6 and T6-7. Spinal stenosis was also prominent in the lumbar region at L3-L4 and L4-L5 due to a combination of degenerative disc disease and facet arthropathy along with thickening of the ligamenta flava.  Patient subsequently underwent anterior cervical discectomy and fusion C6-T1 approximately February 13, 2013 with C7 laminectomy.   Most recent cervical spine MRI study after fusion February 14, 2013 showed status post interval posterior fusion of C6-T1 with C7 laminectomy and an improvement in alignment. There was no evidence of immediate complication.  Although not remembered by the patient she has subsequently undergone a L4 laminectomy and decompression including resection of a right L4-5 synovial cyst October 11, 2013.  Patient now complains of pain throughout her body in numerous areas. She has been previously treated with combination of baclofen for spasticity and along with gabapentin for chronic neurogenic pain.  Patient reports having moved to [place] from [place] October 2016. She is only seen her primary care physician here and was sent here for further evaluation regarding her diffuse back neck and body pain.   Presently the patient remains on baclofen and gabapentin. She has evidence of spasticity, specifically increased clonus and hyperreflexia of the lower extremities.  At the present time we will try a short course of therapies although I'm not entirely optimistic that  this will be beneficial for her considering that she has persistent incomplete spinal cord injury.  I'll plan to see her in follow-up in approximately 4-6 weeks' time and we will at least have had a chance to review her medical records ." A/P: with complexity of prior MRI-- we discussed patient may very well just have chronic pain from this point going forward. She gets osme control with gabapentin 900mg  TID and baclofen so we will continue   Cigarette smoker S: down to 2 cigarettes a day. Using wellbutring 3x a day- A/P: I advised 300mg  per day instead of 450mg  wellbutrin- she agrees to cut back. Thrilled she has cut down and encouraged permanent cessation of all cigarettes. Approximately 4 minutes spent in counseling.   Meds ordered this encounter  Medications  . amLODipine (NORVASC) 10 MG tablet    Sig: Take 1 tablet (10 mg total) by mouth daily.    Dispense:  90 tablet    Refill:  1  .  baclofen (LIORESAL) 10 MG tablet    Sig: Take 1 tablet (10 mg total) by mouth 3 (three) times daily as needed for muscle spasms.    Dispense:  90 each    Refill:  5  . metoprolol tartrate (LOPRESSOR) 50 MG tablet    Sig: Take 1 tablet (50 mg total) by mouth 2 (two) times daily.    Dispense:  180 tablet    Refill:  1  . lisinopril (PRINIVIL,ZESTRIL) 40 MG tablet    Sig: Take 1 tablet (40 mg total) by mouth daily.    Dispense:  90 tablet    Refill:  1  . gabapentin (NEURONTIN) 300 MG capsule    Sig: Take 3 capsules (900 mg total) by mouth 3 (three) times daily.    Dispense:  810 capsule    Refill:  1  . buPROPion (WELLBUTRIN XL) 150 MG 24 hr tablet    Sig: Take 2 tablets (300 mg total) by mouth daily. Take one tablet by mouth every morning    Dispense:  60 tablet    Refill:  5    Return precautions advised.  Garret Reddish, MD

## 2017-12-18 NOTE — Progress Notes (Signed)
Subjective:   Brianna Saunders is a 62 y.o. female who presents for Medicare Annual (Initial) preventive examination.  Seen Dr. Yong Channel today  Lives with her son 71yo son 5 children and 4 live here and one in [place]; 7 to 8 grandchildren 66 2 brothers Mother died and misses her 30 mother; lost her x 37 yo  Diet  Eats breakfast Waffles and eggs dtr cooks for her Chicken or toco's or a meal   Exercise Does her housekeeping Makes her own bed, sweeps, washes dishes   Tobacco;  Started med and it is helping Down 2 cigarettes  a day    Health Maintenance Due  Topic Date Due  . Hepatitis C Screening  04-07-1956  . HIV Screening  07/13/1970  . PAP SMEAR  07/13/1976  . MAMMOGRAM  07/13/2005  . COLONOSCOPY  07/13/2005      Medicare now request all "baby boomers" test for possible exposure to Hepatitis C. Many may have been exposed due to dental work, tatoo's, vaccinations when young. The Hepatitis C virus is dormant for many years and then sometimes will cause liver cancer. If you gave blood in the past 15 years, you were most likely checked for Hep C. If you rec'd blood; you may want to consider testing or if you are high risk for any other reason.   HIV deferred to Dr. Yong Channel  Pap q 3 years, will do a pap test next visit  Mammogram x 5 to 62 yo  Agrees to have a cologuard and will order       Objective:     Vitals: BP 126/72   Ht 5' 4.5" (1.638 m)   Wt 159 lb (72.1 kg)   BMI 26.87 kg/m   Body mass index is 26.87 kg/m.  Advanced Directives 12/18/2017  Does Patient Have a Medical Advance Directive? Yes    Advanced Directive; Reviewed advanced directive and agreed to receipt of information and discussion.  Focused face to face x  20 minutes discussing HCPOA and Living will and reviewed all the questions in the [place] forms. The patient voices understanding of HCPOA; LW reviewed and information provided on each question. Educated on  how to revoke this HCPOA or LW at any time.   Also  discussed life prolonging measures (given a few examples) and where she could choose to initiate or not;  the ability to given the HCPOA power to change her living will or not if she cannot speak for herself; as well as finalizing the will by 2 unrelated witnesses and notary.  Will call for questions and given information on [facility] pastoral department for further assistance.   Plans to try and completed and bring into the office when she sees Dr. Yong Channel again    Tobacco Social History   Tobacco Use  Smoking Status Current Every Day Smoker  . Packs/day: 0.50  . Years: 45.00  . Pack years: 22.50  . Types: Cigarettes  . Start date: 11/11/1969  Smokeless Tobacco Never Used  Tobacco Comment   2 cigarettes a day     Ready to quit: Yes Counseling given: Yes Comment: 2 cigarettes a day  States medicine given is helping her to quit  Motivation is high and down to 2 cigarettes per day  Clinical Intake:     Past Medical History:  Diagnosis Date  . Chicken pox   . Depression    for most part does well on celexa 40mg . still has intermittent  periods when she feels down.   Marland Kitchen Hypertension    Past Surgical History:  Procedure Laterality Date  . FRACTURE SURGERY    . PARTIAL HYSTERECTOMY    . SPINE SURGERY     lumbar spine- around 2014  . upper back surgery     states plate in neck and into right shoulder blade reportedly- around 2014   Family History  Problem Relation Age of Onset  . Hypertension Mother   . Heart attack Mother        48  . Hypertension Father   . Kidney disease Father        dialysis  . Kidney disease Brother        dialysis  . Heart attack Maternal Grandmother   . Early death Sister        died at 70 months   Social History   Socioeconomic History  . Marital status: Single    Spouse name: Not on file  . Number of children: Not on file  . Years of education: Not on file  . Highest education  level: Not on file  Social Needs  . Financial resource strain: Not on file  . Food insecurity - worry: Not on file  . Food insecurity - inability: Not on file  . Transportation needs - medical: Not on file  . Transportation needs - non-medical: Not on file  Occupational History  . Not on file  Tobacco Use  . Smoking status: Current Every Day Smoker    Packs/day: 0.50    Years: 45.00    Pack years: 22.50    Types: Cigarettes    Start date: 11/11/1969  . Smokeless tobacco: Never Used  . Tobacco comment: 2 cigarettes a day  Substance and Sexual Activity  . Alcohol use: No    Frequency: Never  . Drug use: No  . Sexual activity: No  Other Topics Concern  . Not on file  Social History Narrative   LIves with her son and granddaughter- now 19 (often with patient). Also has 1 cat.    Moved to [place] from [place]- slightly closer to family.    Does cleaning/cooking      Disabled due to worsening back/surgeries. 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 Wear and tear from job.    12th grade education      Hobbies: solitaire on laptop, enjoys doing puzzles on the computer    Outpatient Encounter Medications as of 12/18/2017  Medication Sig  . amLODipine (NORVASC) 10 MG tablet Take 1 tablet (10 mg total) by mouth daily.  . baclofen (LIORESAL) 10 MG tablet Take 1 tablet (10 mg total) by mouth 3 (three) times daily as needed for muscle spasms.  Marland Kitchen buPROPion (WELLBUTRIN XL) 150 MG 24 hr tablet Take 2 tablets (300 mg total) by mouth daily. Take one tablet by mouth every morning  . Cholecalciferol (VITAMIN D3) 50000 units TABS Take 1 tablet by mouth once a week.  . citalopram (CELEXA) 40 MG tablet Take 40 mg by mouth daily.  Marland Kitchen gabapentin (NEURONTIN) 300 MG capsule Take 3 capsules (900 mg total) by mouth 3 (three) times daily.  Marland Kitchen lisinopril (PRINIVIL,ZESTRIL) 40 MG tablet Take 1 tablet (40 mg total) by mouth daily.  . metoprolol tartrate (LOPRESSOR) 50 MG tablet Take 1 tablet (50  mg total) by mouth 2 (two) times daily.   No facility-administered encounter medications on file as of 12/18/2017.     Activities of Daily Living In your present state  of health, do you have any difficulty performing the following activities: 12/18/2017  Hearing? N  Vision? Y  Difficulty concentrating or making decisions? N  Walking or climbing stairs? N  Dressing or bathing? N  Doing errands, shopping? N  Preparing Food and eating ? N  Using the Toilet? N  In the past six months, have you accidently leaked urine? (No Data)  Comment some urgency  Do you have problems with loss of bowel control? N  Managing your Medications? N  Managing your Finances? N  Housekeeping or managing your Housekeeping? N    Patient Care Team: Marin Olp, MD as PCP - General (Family Medicine)    Assessment:   This is a routine wellness examination for Brianna Saunders.  Exercise Activities and Dietary recommendations Current Exercise Habits: Home exercise routine  Goals    . Patient Stated     Get out more more; go to a park and relax.       Fall Risk Fall Risk  11/27/2017  Falls in the past year? Yes  Number falls in past yr: 2 or more  Injury with Fall? No  Risk Factor Category  High Fall Risk  Risk for fall due to : Impaired balance/gait   Twisted her ankle but no recent falls    Depression Screen PHQ 2/9 Scores 12/18/2017 11/27/2017  PHQ - 2 Score 4 1  PHQ- 9 Score 8 -    Does get depressed at times but gets busy to resolve. Does not like to ask people to take her as she does not want to slow anyone down. Admits that she would like to get out more and given resources for transportation as well as senior resources which may be available to her. Declines counseling for now Does admit that she would really like to be out working and busy. Will accept outreach to try and become more independent. [place] resources for seniors may assist her with locating programs she may may enjoy of  benefit from     Cognitive Function Manages her home and life wiith our problems Manages her finances and meds         Immunization History  Administered Date(s) Administered  . Pneumococcal Polysaccharide-23 04/02/2017     Screening Tests Health Maintenance  Topic Date Due  . Hepatitis C Screening  May 20, 1956  . HIV Screening  07/13/1970  . PAP SMEAR  07/13/1976  . MAMMOGRAM  07/13/2005  . COLONOSCOPY  07/13/2005  . INFLUENZA VACCINE  07/30/2018 (Originally 06/11/2017)  . TETANUS/TDAP  12/18/2018 (Originally 07/13/1974)        Plan:     PCP Notes   Health Maintenance HIV deferred to Dr. Yong Channel  Pap q 3 years, will do a pap test next visit  Mammogram x 5 to 62 yo; [facility] to [facility] [place] and given the address. Plans to take prior to her next visit with Dr. Grayce Sessions to have a cologuard and [name]  will order  Was educated to procedure and to call office if she does not get a call from [employer] within 2 weeks   Cutting cigarettes down to 2; medicine is working; motivation is high   Abnormal Screens  Depression scale/ Phq is 8 Denies SI Talks a lot about the impact of her disability. Offered counseling but feels the medicine is helping. Given resource to call to check on transportation in the community. Isolates herself some by declining to go out  as she "does not want to slow other down".  Agreed to [employer] at [facility] for seniors for transportation, programs or options that may benefit her.   Referrals  No   Patient concerns; As stated   Nurse Concerns; As noted   Next PCP apt - to schedule in 3 months     I have personally reviewed and noted the following in the patient's chart:   . Medical and social history . Use of alcohol, tobacco or illicit drugs  . Current medications and supplements . Functional ability and status . Nutritional status . Physical activity . Advanced directives . List  of other physicians . Hospitalizations, surgeries, and ER visits in previous 12 months . Vitals . Screenings to include cognitive, depression, and falls . Referrals and appointments  In addition, I have reviewed and discussed with patient certain preventive protocols, quality metrics, and best practice recommendations. A written personalized care plan for preventive services as well as general preventive health recommendations were provided to patient.     Wynetta Fines, RN  12/18/2017

## 2017-12-18 NOTE — Patient Instructions (Signed)
Blood pressure looks much better today  Refilled your medicines  Lets have you see [facility] Maintenance Due  Topic Date Due  . Hepatitis C Screening - future labs Aug 14, 1956  . HIV Screening - future labs 07/13/1970  . PAP SMEAR - schedule in about 3-4 months for visit 07/13/1976  . MAMMOGRAM - [name] will set you up 07/13/2005  . COLONOSCOPY - discuss cologuard with [facility] 07/13/2005

## 2017-12-18 NOTE — Assessment & Plan Note (Deleted)
S: controlled on amlodipine 10mg , lisinopril 40mg , metoprolol 50mg  BID- last visit she had missed some doses and we discussed importance of taking medication regularly BP Readings from Last 3 Encounters:  12/18/17 126/72  11/27/17 (!) 148/92  A/P: We discussed blood pressure goal of <140/90. Continue current meds:  As now at goal

## 2017-12-18 NOTE — Assessment & Plan Note (Addendum)
S: PHQ9 elevated today compared to last visit- she has her ups and downs due to her chronic pain and wanting to be able to be working like she used to before she started with her back issues.   She is on celexa 40mg  and wellbutrin 150mg  tablets XR- unfortunately she just started taking 3 on her own to help her with quitting smoking as she had read something about this on the internet.  Depression screen [facility] 2/9 12/18/2017 11/27/2017  Decreased Interest 2 0  Down, Depressed, Hopeless 2 1  PHQ - 2 Score 4 1  Altered sleeping 0 -  Tired, decreased energy 0 -  Change in appetite 1 -  Feeling bad or failure about yourself  3 -  Trouble concentrating 0 -  Moving slowly or fidgety/restless 0 -  Suicidal thoughts 0 -  PHQ-9 Score 8 -  Difficult doing work/chores Somewhat difficult -  A/P: continue celexa 40mg . Asked her to limit wellbutrin to 2 a day instead of 3 she has been taking. We will reevaluate with pHQ9 at follow up when we do her pap smear. She feels bad about herself because she cannot work though she knows this is not her fault but instead due to chronic pain.

## 2017-12-19 ENCOUNTER — Telehealth: Payer: Self-pay | Admitting: *Deleted

## 2017-12-19 MED ORDER — BUPROPION HCL ER (XL) 150 MG PO TB24: 300.0000 mg | ORAL_TABLET | Freq: Every day | ORAL | 5 refills | Status: DC

## 2017-12-19 NOTE — Telephone Encounter (Signed)
Should be 2 tablets- not 1 tablet- sorry.

## 2017-12-19 NOTE — Telephone Encounter (Signed)
Dr. Yong Channel, please clarify Rx for Wellbutrin there was 2 sets of directions sent with Rx.

## 2017-12-19 NOTE — Telephone Encounter (Signed)
Rx sent to pharmacy with correct directions.

## 2018-01-16 ENCOUNTER — Other Ambulatory Visit: Payer: Self-pay

## 2018-01-16 MED ORDER — BUPROPION HCL ER (XL) 150 MG PO TB24: 300.0000 mg | ORAL_TABLET | Freq: Every day | ORAL | 1 refills | Status: DC

## 2018-01-19 ENCOUNTER — Telehealth: Payer: Self-pay

## 2018-01-19 NOTE — Telephone Encounter (Signed)
-----   Message from Marin Olp, MD sent at 01/14/2018  3:48 PM EST ----- Please encourage patient to complete cologuard  Garret Reddish  ----- Message ----- From: SYSTEM Sent: 12/23/2017  12:06 AM To: Marin Olp, MD

## 2018-01-19 NOTE — Telephone Encounter (Signed)
Called and spoke with patient who states she has received her Cologuard kit. She plans to complete the kit on a Monday and return it. We reviewed over how to complete kit and she verbalized understanding

## 2018-01-23 ENCOUNTER — Ambulatory Visit (INDEPENDENT_AMBULATORY_CARE_PROVIDER_SITE_OTHER): Payer: Medicare Other | Admitting: Physician Assistant

## 2018-01-23 ENCOUNTER — Encounter: Payer: Self-pay | Admitting: Physician Assistant

## 2018-01-23 VITALS — BP 130/86 | HR 69 | Temp 98.5°F | Ht 64.5 in | Wt 161.4 lb

## 2018-01-23 DIAGNOSIS — M25511 Pain in right shoulder: Secondary | ICD-10-CM | POA: Diagnosis not present

## 2018-01-23 MED ORDER — PREDNISONE 10 MG PO TABS: 10.0000 mg | ORAL_TABLET | Freq: Three times a day (TID) | ORAL | 0 refills | Status: AC

## 2018-01-23 NOTE — Patient Instructions (Signed)
Start oral prednisone and take with food.  Please call our office on Monday if symptoms are not improved and make an appointment to see Dr. Teresa Coombs for further evaluation.  If symptoms worsen over the weekend, go to the Emergency Room.

## 2018-01-23 NOTE — Progress Notes (Signed)
Brianna Saunders is a 62 y.o. female here for a new problem.  I acted as a Education administrator for [employer], PA-C Anselmo Pickler, LPN  History of Present Illness:   Chief Complaint  Patient presents with  . Right shoulder pain    Shoulder Pain   The pain is present in the right shoulder (radiates down into bicep). This is a new problem. Episode onset: Started on Wednesday. There has been no history of extremity trauma. The problem occurs constantly. The problem has been gradually worsening. The quality of the pain is described as aching. The pain is at a severity of 8/10. The pain is moderate. Associated symptoms include a limited range of motion. Pertinent negatives include no fever, numbness, stiffness or tingling. The symptoms are aggravated by activity. She has tried heat, acetaminophen and NSAIDS for the symptoms. The treatment provided no relief.   Patient has a history of chronic pain, I briefly reviewed her prior visit with Dr. Rushie Chestnut. She has been documented as a poor historian, per our discussion today she reports that she has never had R shoulder pain, however, per his note on 11/27/17 "she reports pain in her low back, neck, and right shoulder area where she reports she has a plate."   She is currently taking 10 mg Baclofen TID and 300 mg Neurontin TID for her chronic pain.  Past Medical History:  Diagnosis Date  . Chicken pox   . Hypertension      Social History   Socioeconomic History  . Marital status: Single    Spouse name: Not on file  . Number of children: Not on file  . Years of education: Not on file  . Highest education level: Not on file  Social Needs  . Financial resource strain: Not on file  . Food insecurity - worry: Not on file  . Food insecurity - inability: Not on file  . Transportation needs - medical: Not on file  . Transportation needs - non-medical: Not on file  Occupational History  . Not on file  Tobacco Use  . Smoking status:  Current Every Day Smoker    Packs/day: 0.50    Years: 45.00    Pack years: 22.50    Types: Cigarettes    Start date: 11/11/1969  . Smokeless tobacco: Never Used  . Tobacco comment: 2 cigarettes a day  Substance and Sexual Activity  . Alcohol use: No    Frequency: Never  . Drug use: No  . Sexual activity: No  Other Topics Concern  . Not on file  Social History Narrative   2LIves with her son and granddaughter- now 43 (often with patient). Also has 1 cat.    Moved to [place] from [place]- slightly closer to family.    Does cleaning/cooking      Disabled due to worsening back/surgeries. 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 Wear and tear from job.    12th grade education      Hobbies: solitaire on laptop, enjoys doing puzzles on the computer    Past Surgical History:  Procedure Laterality Date  . FRACTURE SURGERY    . PARTIAL HYSTERECTOMY    . SPINE SURGERY     lumbar spine- around 2014  . upper back surgery     states plate in neck and into right shoulder blade reportedly- around 2014    Family History  Problem Relation Age of Onset  . Hypertension Mother   . Heart attack Mother  74  . Hypertension Father   . Kidney disease Father        dialysis  . Kidney disease Brother        dialysis  . Heart attack Maternal Grandmother   . Early death Sister        died at 6 months    No Known Allergies  Current Medications:   Current Outpatient Medications:  .  amLODipine (NORVASC) 10 MG tablet, Take 1 tablet (10 mg total) by mouth daily., Disp: 90 tablet, Rfl: 1 .  baclofen (LIORESAL) 10 MG tablet, Take 1 tablet (10 mg total) by mouth 3 (three) times daily as needed for muscle spasms., Disp: 90 each, Rfl: 5 .  buPROPion (WELLBUTRIN XL) 150 MG 24 hr tablet, Take 2 tablets (300 mg total) by mouth daily., Disp: 180 tablet, Rfl: 1 .  Cholecalciferol (VITAMIN D3) 50000 units TABS, Take 1 tablet by mouth once a week., Disp: , Rfl:  .  citalopram  (CELEXA) 40 MG tablet, Take 40 mg by mouth daily., Disp: , Rfl:  .  gabapentin (NEURONTIN) 300 MG capsule, Take 3 capsules (900 mg total) by mouth 3 (three) times daily., Disp: 810 capsule, Rfl: 1 .  lisinopril (PRINIVIL,ZESTRIL) 40 MG tablet, Take 1 tablet (40 mg total) by mouth daily., Disp: 90 tablet, Rfl: 1 .  metoprolol tartrate (LOPRESSOR) 50 MG tablet, Take 1 tablet (50 mg total) by mouth 2 (two) times daily., Disp: 180 tablet, Rfl: 1 .  predniSONE (DELTASONE) 10 MG tablet, Take 1 tablet (10 mg total) by mouth 3 (three) times daily with meals for 5 days., Disp: 15 tablet, Rfl: 0   Review of Systems:   Review of Systems  Constitutional: Negative for chills, fever, malaise/fatigue and weight loss.  Respiratory: Negative for shortness of breath.   Cardiovascular: Negative for chest pain, orthopnea, claudication and leg swelling.  Gastrointestinal: Negative for heartburn, nausea and vomiting.  Musculoskeletal: Negative for stiffness.  Neurological: Negative for dizziness, tingling, numbness and headaches.    Vitals:   Vitals:   01/23/18 1557  BP: 130/86  Pulse: 69  Temp: 98.5 F (36.9 C)  TempSrc: Oral  SpO2: 97%  Weight: 161 lb 6.1 oz (73.2 kg)  Height: 5' 4.5" (1.638 m)     Body mass index is 27.27 kg/m.  Physical Exam:   Physical Exam  Constitutional: She appears well-developed. She is cooperative.  Non-toxic appearance. She does not have a sickly appearance. She does not appear ill. No distress.  Cardiovascular: Normal rate, regular rhythm, S1 normal, S2 normal, normal heart sounds and normal pulses.  No LE edema  Pulmonary/Chest: Effort normal and breath sounds normal.  Musculoskeletal:  Grip strength 5/5 bilaterally. Somewhat rigid movements when passively moving R upper extremity. No evidence of muscle atrophy in R shoulder or upper arm. No decreased ROM. Pain elicited with resisted abduction of R arm.  Neurological: She is alert. GCS eye subscore is 4. GCS verbal  subscore is 5. GCS motor subscore is 6.  Skin: Skin is warm, dry and intact.  Psychiatric: She has a normal mood and affect. Her speech is normal and behavior is normal.  Nursing note and vitals reviewed.   Assessment and Plan:    Daneen was seen today for right shoulder pain.  Diagnoses and all orders for this visit:  Right shoulder pain, unspecified chronicity Question chronicity, as patient reports that this is new pain for her but chart states chronic area of pain for her. I briefly reviewed case  with Dr. Briscoe Deutscher. Start prednisone per orders. I recommended that if pain persists after treatment to call our office, may need appointment with Dr. Paulla Fore. If any worsening symptoms over the weekend, instructed to seek medical attention immediately.  Other orders -     predniSONE (DELTASONE) 10 MG tablet; Take 1 tablet (10 mg total) by mouth 3 (three) times daily with meals for 5 days.    . Reviewed expectations re: course of current medical issues. . Discussed self-management of symptoms. . Outlined signs and symptoms indicating need for more acute intervention. . Patient verbalized understanding and all questions were answered. . See orders for this visit as documented in the electronic medical record. . Patient received an After-Visit Summary.  CMA or LPN served as scribe during this visit. History, Physical, and Plan performed by medical provider. Documentation and orders reviewed and attested to.  Inda Coke, PA-C

## 2018-01-28 ENCOUNTER — Ambulatory Visit (INDEPENDENT_AMBULATORY_CARE_PROVIDER_SITE_OTHER): Payer: Medicare Other | Admitting: Sports Medicine

## 2018-01-28 ENCOUNTER — Encounter: Payer: Self-pay | Admitting: Sports Medicine

## 2018-01-28 ENCOUNTER — Ambulatory Visit: Payer: Self-pay

## 2018-01-28 ENCOUNTER — Ambulatory Visit (INDEPENDENT_AMBULATORY_CARE_PROVIDER_SITE_OTHER): Payer: Medicare Other

## 2018-01-28 VITALS — BP 136/88 | HR 97 | Wt 161.4 lb

## 2018-01-28 DIAGNOSIS — M25511 Pain in right shoulder: Secondary | ICD-10-CM

## 2018-01-28 DIAGNOSIS — R262 Difficulty in walking, not elsewhere classified: Secondary | ICD-10-CM | POA: Insufficient documentation

## 2018-01-28 DIAGNOSIS — G8928 Other chronic postprocedural pain: Secondary | ICD-10-CM

## 2018-01-28 NOTE — Procedures (Signed)
X-Rays obtained at [facility] Interpreted by myself Gerda Diss, DO) during office visit.  Results were reviewed with the patient at the time of the visit.   3 VIEW X-RAY of: Right shoulder  FINDINGS:  Overall well-maintained acromial space with a type II acromion with significant osteophytic spurring of the AC joint.  No appreciable osseous lesions but small amount of osteophytic spurring at the inferior margin of the glenohumeral joint.  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

## 2018-01-28 NOTE — Progress Notes (Signed)
  Brianna Saunders - 62 y.o. female MRN [id number]  Date of birth: Apr 12, 1956  Scribe for today's visit: Gwynneth Albright, CMA      SUBJECTIVE:  Brianna Saunders is here for No chief complaint on file. Marland Kitchen  Referred by: Inda Coke, PA 01/28/18: Her R shoulder pain symptoms INITIALLY: Began 01/21/18 and pt reports no known trauma or injury. She does have dx of chronic pain. Described as moderate (6/10) aching, radiating to the R bicep Worsened with activity.  Improved with rest and medications given by Inda Coke last week.  Additional associated symptoms include: She reports decreased ROM, denies stiffness, numbness, tingling.     At this time symptoms are worsening compared to onset, pain is constant.  She takes Baclofen TID and Gabapentin 300 mg TID for chronic pain. She has tried using heat on the shoulder with no relief. She has tried taking Acetaminophen and NSAID's with no relief. She was seen by Inda Coke 01/23/18 and prescribed Prednisone 10 mg to take TID for 5 days that provided some relief.   No recent imaging of the R shoulder   ROS Denies night time disturbances. Denies fevers, chills, or night sweats. Denies unexplained weight loss. Denies personal history of cancer. Denies changes in bowel or bladder habits. Denies recent unreported falls. Denies new or worsening dyspnea or wheezing. Denies headaches or dizziness.  Reports numbness, tingling or weakness  In the extremities. Has history of numbness in feet.  Denies dizziness or presyncopal episodes Denies lower extremity edema      Please see additional documentation for Objective, Assessment and Plan sections. Pertinent additional documentation may be included in corresponding procedure notes, imaging studies, problem based documentation and patient instructions. Please see these sections of the encounter for additional information regarding this visit.  CMA/ATC served as Education administrator during this  visit. History, Physical, and Plan performed by medical provider. Documentation and orders reviewed and attested to.      Gerda Diss, [facility] Physician

## 2018-01-28 NOTE — Assessment & Plan Note (Signed)
Unstable gait.  4 wheel walker dependent with worsening gait due to change in shoulder biomechanics Will benefit from PT for both shoulder and gait training

## 2018-01-28 NOTE — Assessment & Plan Note (Signed)
Subacromial bursitis injected today under [place]

## 2018-01-28 NOTE — Progress Notes (Signed)
   [name]. Tierra Divelbiss, [place] at [facility] [phone]  Brianna Saunders - 62 y.o. female MRN [id number]  Date of birth: 1956-04-05  Visit Date: 01/28/2018  PCP: Marin Olp, MD   Referred by: Marin Olp, MD  Please see additional documentation for HPI, review of systems.  HISTORY & PERTINENT PRIOR DATA:  Prior History reviewed and updated per electronic medical record.  Significant history, findings, studies and interim changes include:  reports that she has been smoking cigarettes.  She started smoking about 48 years ago. She has a 22.50 pack-year smoking history. she has never used smokeless tobacco. No results for input(s): HGBA1C, LABURIC, CREATINE in the last 8760 hours. No specialty comments available. Problem  Difficulty Walking  Chronic Pain   Neck, right shoulder, low back. Right hand.      OBJECTIVE:  VS:  HT:    WT:161 lb 6.4 oz (73.2 kg)  BMI:27.29    BP:136/88  HR:97bpm  TEMP: ( )  RESP:95 %   PHYSICAL EXAM: Constitutional: WDWN, Non-toxic appearing. Psychiatric: Alert & appropriately interactive.  Not depressed or anxious appearing. Respiratory: No increased work of breathing.  Trachea Midline Eyes: Pupils are equal.  EOM intact without nystagmus.  No scleral icterus  NEUROVASCULAR exam: No clubbing or cyanosis appreciated No significant venous stasis changes Capillary Refill: normal, less than 2 seconds   Right shoulder calcific retraction with difficulty fully raising above her head.  She has intact internal rotation, external rotation, empty can, Speed and O'Brien's testing strength but does have some pain with empty can which is minimal.  Worse pain with Hawkins.  Small amount of crepitation with axial load and circumduction.  No pain over the [facility] joint.  Radial pulses intact.  Antalgic gait with significant forward flexed position using a Rollator 4 wheeled walker.  Difficulty with  flexion extension of the hip as well as left dorsiflexion plantarflexion worse on the right..  ASSESSMENT & PLAN:   1. Acute pain of right shoulder   2. Other chronic postprocedural pain   3. Difficulty walking    Orders & Meds:  Orders Placed This Encounter  Procedures  . XR Shoulder Right  . [place] MSK POCT ULTRASOUND  . Ambulatory referral to Physical Therapy   No orders of the defined types were placed in this encounter.   PLAN:     Chronic pain Subacromial bursitis injected today under [place]  Difficulty walking Unstable gait.  4 wheel walker dependent with worsening gait due to change in shoulder biomechanics Will benefit from PT for both shoulder and gait training   Follow-up: Return in about 6 weeks (around 03/11/2018).

## 2018-01-28 NOTE — Patient Instructions (Signed)

## 2018-01-28 NOTE — Procedures (Signed)
PROCEDURE NOTE:  Ultrasound Guided: Injection: right Shoulder Images were obtained and interpreted by myself, Teresa Coombs, DO  Images have been saved and stored to PACS system. Images obtained on: GE S7 Ultrasound machine  ULTRASOUND FINDINGS:  Biceps Tendon: Normal Pec Major Insertion: Normal Subscapularis Tendon: Normal Supraspinatus Tendon: Thickened but no overt tearing. Subacromial bursa appreciated Infraspinatus/Teres Minor Tendon: Normal AC Joint: Moderate degenerative change with + mushroom sign however no significant pain with sonopalpation JOINT: No significant GH spurring appreciated; no effusion appreciated LABRUM: Not evaluated   DESCRIPTION OF PROCEDURE:  The patient's clinical condition is marked by substantial pain and/or significant functional disability. Other conservative therapy has not provided relief, is contraindicated, or not appropriate. There is a reasonable likelihood that injection will significantly improve the patient's pain and/or functional impairment.  After discussing the risks, benefits and expected outcomes of the injection and all questions were reviewed and answered, the patient wished to undergo the above named procedure. Verbal consent was obtained.  The ultrasound was used to identify the target structure and adjacent neurovascular structures. The skin was then prepped in sterile fashion and the target structure was injected under direct visualization using sterile technique as below:  PREP: Alcohol, Ethel Chloride  APPROACH: posterior, single injection, 25g 1.5in.  INJECTATE: 2cc: 0.5% marcaine, 2cc: 40mg /mL DepoMedrol   ASPIRATE: None   DRESSING: Band-Aid   Post procedural instructions including recommending icing and warning signs for infection were reviewed.   This procedure was well tolerated and there were no complications.   IMPRESSION: Succesful Ultrasound Guided: Injection

## 2018-02-04 ENCOUNTER — Ambulatory Visit (INDEPENDENT_AMBULATORY_CARE_PROVIDER_SITE_OTHER): Payer: Medicare Other | Admitting: Physical Therapy

## 2018-02-04 DIAGNOSIS — M25511 Pain in right shoulder: Secondary | ICD-10-CM

## 2018-02-04 DIAGNOSIS — G8929 Other chronic pain: Secondary | ICD-10-CM

## 2018-02-04 DIAGNOSIS — M6281 Muscle weakness (generalized): Secondary | ICD-10-CM

## 2018-02-04 DIAGNOSIS — R2689 Other abnormalities of gait and mobility: Secondary | ICD-10-CM

## 2018-02-05 ENCOUNTER — Other Ambulatory Visit: Payer: Self-pay | Admitting: Physical Therapy

## 2018-02-05 NOTE — Patient Instructions (Signed)
Supine shoulder AAROM with cane x20

## 2018-02-05 NOTE — Therapy (Signed)
[facility] [place], [name], [address] Phone: [phone]   Fax:  [phone]  Physical Therapy Evaluation  Patient Details  Name: Brianna Saunders MRN: [id number] Date of Birth: 28-Mar-1956 Referring Provider: Teresa Coombs   Encounter Date: 02/04/2018  PT End of Session - 02/05/18 1011    Visit Number  1    Number of Visits  16    Date for PT Re-Evaluation  04/02/18    Authorization Type  Medicare    PT Start Time  1600    PT Stop Time  3614    PT Time Calculation (min)  46 min    Equipment Utilized During Treatment  Gait belt    Activity Tolerance  Patient tolerated treatment well       Past Medical History:  Diagnosis Date  . Chicken pox   . Hypertension     Past Surgical History:  Procedure Laterality Date  . FRACTURE SURGERY    . PARTIAL HYSTERECTOMY    . SPINE SURGERY     lumbar spine- around 2014  . upper back surgery     states plate in neck and into right shoulder blade reportedly- around 2014    There were no vitals filed for this visit.   Subjective Assessment - 02/04/18 1609    Subjective  Pt states increased pain in R shoulder since 2014, when she had surgery for her back. She had an injection recently, that has helped pain. She states increased pain with reaching, lifting, using walker. She states increased pressure on R UE during use of walker. She is using 4 WW for all mobility. States independence with bathing, dressing, and mobility. Pt is a poor historian, so there is some diffiuclty getting the full story of her current mobility/ability . Unsure how much pt's mobility has declined in the last 2 years since she has seen Neurologist. She states that her LE mobility , walking, and balance are worsening. Pt states newer episodes of bowel/bladder incontinence, but previous Neurology notes state this as an issue. 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     Limitations  Standing;Walking;House hold activities    Patient Stated Goals  Decreased shoulder pain, improved walking, balance,     Currently in Pain?  Yes    Pain Score  2     Pain Location  Shoulder    Pain Orientation  Right    Pain Descriptors / Indicators  Aching    Pain Type  Chronic pain    Pain Onset  More than a month ago    Pain Frequency  Intermittent    Aggravating Factors   Increased use of R UE, lifting, use of walker    Multiple Pain Sites  Yes    Pain Score  6    Pain Location  Back    Pain Orientation  Mid;Lower    Pain Descriptors / Indicators  Aching    Pain Type  Chronic pain    Pain Onset  More than a month ago    Pain Frequency  Constant         [facility] PT Assessment - 02/05/18 0001      Assessment   Medical Diagnosis  R shoulder pain, Gait abnormality     Referring Provider  Teresa Coombs    Prior Therapy  2 years ago      Precautions   Precautions  Fall      Balance Screen  Has the patient fallen in the past 6 months  No      Prior Function   Level of Independence  Independent Pt does not drive      Coordination   Finger Nose Finger Test  WNL    Heel Shin Test  Mild deficit      Functional Tests   Functional tests  Sit to Stand      Sit to Stand   Comments  Independent      AROM   Overall AROM Comments  Shoulder flex: 145,  abd: 135      Strength   Overall Strength Comments  Shoulder strength: 4/5;      Strength Assessment Site  Hip;Knee;Ankle    Right/Left Hip  Right;Left    Right Hip Flexion  3-/5    Right Hip Extension  3-/5    Right Hip External Rotation   3/5    Right Hip Internal Rotation  3/5    Right Hip ABduction  3-/5    Right Hip ADduction  3-/5    Left Hip Flexion  3-/5    Left Hip Extension  3-/5    Left Hip External Rotation  3/5    Left Hip Internal Rotation  3/5    Left Hip ABduction  3-/5    Left Hip ADduction  3-/5    Right/Left Knee  Right;Left    Right Knee Flexion  4-/5    Right Knee Extension  4-/5     Left Knee Flexion  4-/5    Left Knee Extension  4-/5    Right/Left Ankle  Right;Left    Right Ankle Dorsiflexion  3+/5    Right Ankle Plantar Flexion  3+/5    Right Ankle Inversion  4-/5    Right Ankle Eversion  4-/5    Left Ankle Dorsiflexion  3-/5    Left Ankle Plantar Flexion  3-/5    Left Ankle Inversion  3-/5    Left Ankle Eversion  3+/5      Bed Mobility   Bed Mobility  -- Independent      Transfers   Transfers  -- Sit to stand: Independent with use of hands       Ambulation/Gait   Ambulation/Gait  Yes    Ambulation/Gait Assistance  5: Supervision    Ambulation Distance (Feet)  50 Feet    Assistive device  4-wheeled walker    Gait Pattern  Poor foot clearance - left;Poor foot clearance - right;Ataxic              No data recorded  Objective measurements completed on examination: See above findings.      [facility] Adult PT Treatment/Exercise - 02/05/18 0001      Exercises   Exercises  Shoulder      Shoulder Exercises: Supine   Flexion  AAROM;15 reps;Limitations    Flexion Limitations  cane             PT Education - 02/05/18 0931    Education provided  Yes  (Pended)     Education Details  Safety, Use of shoulder, possible need for PT for walking.   (Pended)     Person(s) Educated  Patient  (Pended)     Methods  Explanation  (Pended)        PT Short Term Goals - 02/05/18 1019      PT SHORT TERM GOAL #1   Title  Pt to be independent with initial HEP for R  shoulder     Time  2    Period  Weeks    Status  New    Target Date  02/19/18                Plan - 02/05/18 1019    Clinical Impression Statement  Pt with primary complaint of increased pain in R shoulder today. She has decreased pain since injection in last week. She has mild ROM deficits and strength deficits, that are effecting functional mobility. More concerning, is pt's significant decrease in gait, balance, and coordination. She is able to ambulate with 4 WW, but with  severe deficits and very poor safety, making her an increased fall risk. Pt with decreased strength in LE's, most noted in L foot, and hips. She has poor standing, static and dynamic balance. She has significant decrease in gait mechanics, with poor step height, poor ability for limb advancement, and is dragging toes during swing phase. Due to severity of LE and gait deficits, pt will be referred to Neuro Specialty clinic for futher evaluation, LTGs to be addressed for gait, LE strength, balance and coordination at that time. Pt also will need HEP for R shoulder. Pt may need referral back to Neurology, as she has not seen since 2017. There is some concern for worsening symptoms and mobility, but difficult to assess due to pt's poor history about previous abilities. Discussed findings with sports med MD (who referred for her shoulder), he is in agreement with plan.     Clinical Presentation  Evolving    Clinical Decision Making  High    Rehab Potential  Fair    PT Frequency  2x / week    PT Duration  8 weeks    PT Treatment/Interventions  ADLs/Self Care Home Management;Cryotherapy;Electrical Stimulation;Iontophoresis 4mg /ml Dexamethasone;Moist Heat;Therapeutic exercise;Therapeutic activities;Functional mobility training;Stair training;Gait training;DME Instruction;Ultrasound;Balance training;Neuromuscular re-education;Patient/family education;Orthotic Fit/Training;Prosthetic Training;Wheelchair mobility training;Manual techniques;Taping;Energy conservation;Passive range of motion    PT Next Visit Plan  Due to severity of LE and gait deficits, pt will be referred to Neuro Specialty clinic for futher evaluation, LTGs to be addressed for gait, LE strength, balance and coordination at that time. Pt also will need HEP for R shoulder. Pt may need referral back to Neurology, as she has not seen since 2017.     Recommended Other Services  Possible referral back to Neurology     Consulted and Agree with Plan of Care   Patient       Patient will benefit from skilled therapeutic intervention in order to improve the following deficits and impairments:  Abnormal gait, Decreased endurance, Hypomobility, Impaired sensation, Decreased knowledge of precautions, Decreased activity tolerance, Decreased knowledge of use of DME, Decreased strength, Impaired UE functional use, Pain, Difficulty walking, Decreased mobility, Decreased balance, Decreased range of motion, Impaired perceived functional ability, Improper body mechanics, Impaired flexibility, Decreased safety awareness, Decreased coordination, Increased muscle spasms  Visit Diagnosis: Chronic right shoulder pain  Other abnormalities of gait and mobility  Muscle weakness (generalized)     Problem List Patient Active Problem List   Diagnosis Date Noted  . Difficulty walking 01/28/2018  . Cigarette smoker 12/18/2017  . Hypertension 11/27/2017  . Chronic pain 11/27/2017  . Depression, major, single episode, in partial remission ([place])     Lyndee Hensen, PT, DPT 10:44 AM  02/05/18    [facility] [place], [name], [address] Phone: [phone]   Fax:  [phone]  Name: Brianna Saunders MRN:  [id number] Date of Birth: Sep 12, 1956

## 2018-02-07 ENCOUNTER — Encounter (HOSPITAL_COMMUNITY): Payer: Self-pay | Admitting: Emergency Medicine

## 2018-02-07 ENCOUNTER — Other Ambulatory Visit: Payer: Self-pay

## 2018-02-07 ENCOUNTER — Emergency Department (HOSPITAL_COMMUNITY)
Admission: EM | Admit: 2018-02-07 | Discharge: 2018-02-07 | Disposition: A | Discharge status: Home / Self Care | Payer: Medicare Other | Source: Home / Self Care

## 2018-02-07 ENCOUNTER — Encounter (HOSPITAL_COMMUNITY): Payer: Self-pay | Admitting: Nurse Practitioner

## 2018-02-07 DIAGNOSIS — Z79899 Other long term (current) drug therapy: Secondary | ICD-10-CM | POA: Insufficient documentation

## 2018-02-07 DIAGNOSIS — K047 Periapical abscess without sinus: Secondary | ICD-10-CM | POA: Diagnosis not present

## 2018-02-07 DIAGNOSIS — K0889 Other specified disorders of teeth and supporting structures: Secondary | ICD-10-CM | POA: Insufficient documentation

## 2018-02-07 DIAGNOSIS — R220 Localized swelling, mass and lump, head: Secondary | ICD-10-CM | POA: Diagnosis not present

## 2018-02-07 DIAGNOSIS — F17210 Nicotine dependence, cigarettes, uncomplicated: Secondary | ICD-10-CM | POA: Insufficient documentation

## 2018-02-07 DIAGNOSIS — R51 Headache: Secondary | ICD-10-CM | POA: Diagnosis not present

## 2018-02-07 DIAGNOSIS — I10 Essential (primary) hypertension: Secondary | ICD-10-CM | POA: Insufficient documentation

## 2018-02-07 DIAGNOSIS — R600 Localized edema: Secondary | ICD-10-CM | POA: Diagnosis present

## 2018-02-07 DIAGNOSIS — K069 Disorder of gingiva and edentulous alveolar ridge, unspecified: Secondary | ICD-10-CM | POA: Insufficient documentation

## 2018-02-07 NOTE — ED Notes (Signed)
Pt up to desk to ask about wait times. Pt stated that she will be going to [facility].

## 2018-02-07 NOTE — ED Triage Notes (Signed)
The patient said she started having tooth and gum pain yesterday but she thought it was something she had eaten.  She thought the pain would go away but it did not.  She has gargled w/peroxide, and salt water and took aleve but the pain has gotten worse.  No other symptoms.

## 2018-02-07 NOTE — ED Triage Notes (Signed)
Pt is c/o right facial-jaw pain and swelling. Onset she reports as yesterday.

## 2018-02-07 NOTE — ED Notes (Signed)
Patient upset because she has to wait to see a MD patient stated she was at a hospital before here and waited 3 hours patient repeatedly stated "only in [place]"

## 2018-02-08 ENCOUNTER — Emergency Department (HOSPITAL_COMMUNITY)
Admission: EM | Admit: 2018-02-08 | Discharge: 2018-02-08 | Disposition: A | Discharge status: Home / Self Care | Payer: Medicare Other | Attending: Emergency Medicine | Admitting: Emergency Medicine

## 2018-02-08 DIAGNOSIS — K047 Periapical abscess without sinus: Secondary | ICD-10-CM

## 2018-02-08 DIAGNOSIS — K069 Disorder of gingiva and edentulous alveolar ridge, unspecified: Secondary | ICD-10-CM

## 2018-02-08 LAB — CBC WITH DIFFERENTIAL/PLATELET
Basophils Absolute: 0 10*3/uL (ref 0.0–0.1)
Basophils Relative: 0 %
Eosinophils Absolute: 0.2 10*3/uL (ref 0.0–0.7)
Eosinophils Relative: 2 %
HEMATOCRIT: 34.3 % — AB (ref 36.0–46.0)
Hemoglobin: 11.1 g/dL — ABNORMAL LOW (ref 12.0–15.0)
LYMPHS ABS: 3.6 10*3/uL (ref 0.7–4.0)
Lymphocytes Relative: 35 %
MCH: 29.6 pg (ref 26.0–34.0)
MCHC: 32.4 g/dL (ref 30.0–36.0)
MCV: 91.5 fL (ref 78.0–100.0)
MONO ABS: 0.6 10*3/uL (ref 0.1–1.0)
Monocytes Relative: 6 %
NEUTROS ABS: 5.6 10*3/uL (ref 1.7–7.7)
Neutrophils Relative %: 57 %
PLATELETS: 230 10*3/uL (ref 150–400)
RBC: 3.75 MIL/uL — AB (ref 3.87–5.11)
RDW: 15.7 % — ABNORMAL HIGH (ref 11.5–15.5)
WBC: 10.1 10*3/uL (ref 4.0–10.5)

## 2018-02-08 LAB — COMPREHENSIVE METABOLIC PANEL
ALBUMIN: 3.9 g/dL (ref 3.5–5.0)
ALT: 13 U/L — ABNORMAL LOW (ref 14–54)
AST: 14 U/L — AB (ref 15–41)
Alkaline Phosphatase: 69 U/L (ref 38–126)
Anion gap: 3 — ABNORMAL LOW (ref 5–15)
BUN: 22 mg/dL — AB (ref 6–20)
CHLORIDE: 111 mmol/L (ref 101–111)
CO2: 26 mmol/L (ref 22–32)
Calcium: 8.9 mg/dL (ref 8.9–10.3)
Creatinine, Ser: 1.42 mg/dL — ABNORMAL HIGH (ref 0.44–1.00)
GFR calc Af Amer: 45 mL/min — ABNORMAL LOW (ref >60)
GFR calc non Af Amer: 39 mL/min — ABNORMAL LOW (ref >60)
GLUCOSE: 99 mg/dL (ref 65–99)
POTASSIUM: 4.6 mmol/L (ref 3.5–5.1)
Sodium: 140 mmol/L (ref 135–145)
Total Bilirubin: 0.5 mg/dL (ref 0.3–1.2)
Total Protein: 7.5 g/dL (ref 6.5–8.1)

## 2018-02-08 LAB — I-STAT CG4 LACTIC ACID, ED
LACTIC ACID, VENOUS: 0.39 mmol/L — AB (ref 0.5–1.9)
Lactic Acid, Venous: 0.81 mmol/L (ref 0.5–1.9)

## 2018-02-08 MED ORDER — HYDROCODONE-ACETAMINOPHEN 5-325 MG PO TABS: 1.0000 | ORAL_TABLET | Freq: Four times a day (QID) | ORAL | 0 refills | Status: DC | PRN

## 2018-02-08 MED ORDER — PENICILLIN V POTASSIUM 500 MG PO TABS
500.0000 mg | ORAL_TABLET | Freq: Once | ORAL | Status: AC
  Administered 2018-02-08: 500 mg via ORAL
  Filled 2018-02-08: qty 1

## 2018-02-08 MED ORDER — PENICILLIN V POTASSIUM 500 MG PO TABS: 500.0000 mg | ORAL_TABLET | Freq: Four times a day (QID) | ORAL | 0 refills | Status: AC

## 2018-02-08 NOTE — ED Provider Notes (Addendum)
[facility] DEPT Provider Note: Georgena Spurling, MD, FACEP  CSN: [id number] MRN: [id number] ARRIVAL: 02/07/18 at [place]: [id number]   CHIEF COMPLAINT  Facial Swelling   HISTORY OF PRESENT ILLNESS  02/08/18 2:13 AM Brianna Saunders is a 62 y.o. female with a 2-day history of pain and swelling of her right mandible.  She is not aware of a specific tooth being the source of this.  She is having moderate to severe pain which is making it difficult to eat.  Eating or drinking exacerbates the pain.  The pain is sharp and radiates to the right side of her face.  She has taken Aleve without adequate relief of the pain.  Consultation with the [facility] state controlled substances database reveals the patient has received no opioid prescriptions in the past 2 years.   Past Medical History:  Diagnosis Date  . Chicken pox   . Hypertension     Past Surgical History:  Procedure Laterality Date  . FRACTURE SURGERY    . PARTIAL HYSTERECTOMY    . SPINE SURGERY     lumbar spine- around 2014  . upper back surgery     states plate in neck and into right shoulder blade reportedly- around 2014    Family History  Problem Relation Age of Onset  . Hypertension Mother   . Heart attack Mother        14  . Hypertension Father   . Kidney disease Father        dialysis  . Kidney disease Brother        dialysis  . Heart attack Maternal Grandmother   . Early death Sister        died at 14 months    Social History   Tobacco Use  . Smoking status: Current Every Day Smoker    Packs/day: 0.50    Years: 45.00    Pack years: 22.50    Types: Cigarettes    Start date: 11/11/1969  . Smokeless tobacco: Never Used  . Tobacco comment: 2 cigarettes a day  Substance Use Topics  . Alcohol use: No    Frequency: Never  . Drug use: No    Prior to Admission medications   Medication Sig Start Date End Date Taking? Authorizing Provider  amLODipine (NORVASC) 10 MG tablet Take 1 tablet  (10 mg total) by mouth daily. 12/18/17  Yes Marin Olp, MD  baclofen (LIORESAL) 10 MG tablet Take 1 tablet (10 mg total) by mouth 3 (three) times daily as needed for muscle spasms. 12/18/17  Yes Marin Olp, MD  buPROPion (WELLBUTRIN XL) 150 MG 24 hr tablet Take 2 tablets (300 mg total) by mouth daily. 01/16/18  Yes Marin Olp, MD  Cholecalciferol (VITAMIN D3) 50000 units TABS Take 1 tablet by mouth once a week.   Yes [provider]  citalopram (CELEXA) 40 MG tablet Take 40 mg by mouth daily.   Yes [provider]  gabapentin (NEURONTIN) 300 MG capsule Take 3 capsules (900 mg total) by mouth 3 (three) times daily. 12/18/17  Yes Marin Olp, MD  lisinopril (PRINIVIL,ZESTRIL) 40 MG tablet Take 1 tablet (40 mg total) by mouth daily. 12/18/17  Yes Marin Olp, MD  metoprolol tartrate (LOPRESSOR) 50 MG tablet Take 1 tablet (50 mg total) by mouth 2 (two) times daily. 12/18/17  Yes Marin Olp, MD  naproxen sodium (ALEVE) 220 MG tablet Take 660 mg by mouth 2 (two) times daily as needed (  mouth pain).   Yes [provider]    Allergies Patient has no known allergies.   REVIEW OF SYSTEMS  Negative except as noted here or in the History of Present Illness.   PHYSICAL EXAMINATION  Initial Vital Signs Blood pressure 140/87, pulse 61, temperature 98.2 F (36.8 C), temperature source Oral, resp. rate 15, SpO2 100 %.  Examination General: Well-developed, well-nourished female in no acute distress; appearance consistent with age of record HENT: normocephalic; atraumatic; good dentition with no obvious caries; tenderness and swelling of the soft tissue adjacent to the right lower premolars; white patches noted on the gums; no submandibular swelling or tenderness Eyes: pupils equal, round and reactive to light; extraocular muscles intact Neck: supple; no lymphadenopathy Heart: regular rate and rhythm Lungs: clear to auscultation bilaterally Abdomen:  soft; nondistended; nontender; bowel sounds present Extremities: No deformity; full range of motion Neurologic: Awake, alert and oriented; motor function intact in all extremities and symmetric; no facial droop Skin: Warm and dry Psychiatric: Normal mood and affect   RESULTS  Summary of this visit's results, reviewed by myself:   EKG Interpretation  Date/Time:    Ventricular Rate:    PR Interval:    QRS Duration:   QT Interval:    QTC Calculation:   R Axis:     Text Interpretation:        Laboratory Studies: Results for orders placed or performed during the hospital encounter of 02/08/18 (from the past 24 hour(s))  Comprehensive metabolic panel     Status: Abnormal   Collection Time: 02/08/18 12:25 AM  Result Value Ref Range   Sodium 140 135 - 145 mmol/L   Potassium 4.6 3.5 - 5.1 mmol/L   Chloride 111 101 - 111 mmol/L   CO2 26 22 - 32 mmol/L   Glucose, Bld 99 65 - 99 mg/dL   BUN 22 (H) 6 - 20 mg/dL   Creatinine, Ser 1.42 (H) 0.44 - 1.00 mg/dL   Calcium 8.9 8.9 - 10.3 mg/dL   Total Protein 7.5 6.5 - 8.1 g/dL   Albumin 3.9 3.5 - 5.0 g/dL   AST 14 (L) 15 - 41 U/L   ALT 13 (L) 14 - 54 U/L   Alkaline Phosphatase 69 38 - 126 U/L   Total Bilirubin 0.5 0.3 - 1.2 mg/dL   GFR calc non Af Amer 39 (L) >60 mL/min   GFR calc Af Amer 45 (L) >60 mL/min   Anion gap 3 (L) 5 - 15  CBC with Differential     Status: Abnormal   Collection Time: 02/08/18 12:25 AM  Result Value Ref Range   WBC 10.1 4.0 - 10.5 K/uL   RBC 3.75 (L) 3.87 - 5.11 MIL/uL   Hemoglobin 11.1 (L) 12.0 - 15.0 g/dL   HCT 34.3 (L) 36.0 - 46.0 %   MCV 91.5 78.0 - 100.0 fL   MCH 29.6 26.0 - 34.0 pg   MCHC 32.4 30.0 - 36.0 g/dL   RDW 15.7 (H) 11.5 - 15.5 %   Platelets 230 150 - 400 K/uL   Neutrophils Relative % 57 %   Neutro Abs 5.6 1.7 - 7.7 K/uL   Lymphocytes Relative 35 %   Lymphs Abs 3.6 0.7 - 4.0 K/uL   Monocytes Relative 6 %   Monocytes Absolute 0.6 0.1 - 1.0 K/uL   Eosinophils Relative 2 %   Eosinophils  Absolute 0.2 0.0 - 0.7 K/uL   Basophils Relative 0 %   Basophils Absolute 0.0 0.0 -  0.1 K/uL  I-Stat CG4 Lactic Acid, ED     Status: Abnormal   Collection Time: 02/08/18 12:33 AM  Result Value Ref Range   Lactic Acid, Venous 0.39 (L) 0.5 - 1.9 mmol/L  I-Stat CG4 Lactic Acid, ED     Status: None   Collection Time: 02/08/18  2:04 AM  Result Value Ref Range   Lactic Acid, Venous 0.81 0.5 - 1.9 mmol/L   Imaging Studies: No results found.  ED COURSE  Nursing notes and initial vitals signs, including pulse oximetry, reviewed.  Vitals:   02/07/18 2248 02/08/18 0143  BP: 121/68 140/87  Pulse: 71 61  Resp: 16 15  Temp: 98.2 F (36.8 C)   TempSrc: Oral   SpO2: 100% 100%   I suspect periodontal infection related to gum disease.  We will start her on antibiotics and refer her to dentistry.  She does not have a dentist currently.  PROCEDURES    ED DIAGNOSES     ICD-10-CM   1. Dental infection K04.7   2. Gum disease K06.9        Venda Dice, MD 02/08/18 1884    Shanon Rosser, MD 02/08/18 0230

## 2018-02-11 ENCOUNTER — Telehealth: Payer: Self-pay | Admitting: Physical Therapy

## 2018-02-11 ENCOUNTER — Ambulatory Visit: Payer: Medicare Other | Attending: Sports Medicine | Admitting: Physical Therapy

## 2018-02-11 DIAGNOSIS — M6281 Muscle weakness (generalized): Secondary | ICD-10-CM | POA: Insufficient documentation

## 2018-02-11 DIAGNOSIS — M62838 Other muscle spasm: Secondary | ICD-10-CM | POA: Insufficient documentation

## 2018-02-11 DIAGNOSIS — R2689 Other abnormalities of gait and mobility: Secondary | ICD-10-CM | POA: Insufficient documentation

## 2018-02-11 NOTE — Telephone Encounter (Signed)
Contacted pt regarding missed visit today and to attempt to reschedule for later in the day.  Pt did not answer and unable to leave message as pt's voicemail was not set up. Will attempt to contact again at a later date.  Rico Junker, PT, DPT 02/11/18    1:31 PM

## 2018-02-23 DIAGNOSIS — Z1212 Encounter for screening for malignant neoplasm of rectum: Secondary | ICD-10-CM | POA: Diagnosis not present

## 2018-02-23 DIAGNOSIS — Z1211 Encounter for screening for malignant neoplasm of colon: Secondary | ICD-10-CM | POA: Diagnosis not present

## 2018-02-24 ENCOUNTER — Encounter: Payer: Self-pay | Admitting: Family Medicine

## 2018-02-26 ENCOUNTER — Encounter: Payer: Self-pay | Admitting: Rehabilitative and Restorative Service Providers"

## 2018-02-26 ENCOUNTER — Ambulatory Visit: Payer: Medicare Other | Admitting: Rehabilitative and Restorative Service Providers"

## 2018-02-26 DIAGNOSIS — M6281 Muscle weakness (generalized): Secondary | ICD-10-CM

## 2018-02-26 DIAGNOSIS — R2689 Other abnormalities of gait and mobility: Secondary | ICD-10-CM

## 2018-02-26 DIAGNOSIS — M62838 Other muscle spasm: Secondary | ICD-10-CM | POA: Diagnosis not present

## 2018-02-26 NOTE — Patient Instructions (Signed)
Butterfly, Sitting    Sit straight or with back against wall. Gently push knees toward floor. Hold _30__ seconds. Repeat _3__ times per session. Do _1-2__ sessions per day.  Copyright  VHI. All rights reserved.     Hamstring Stretch, Seated (Strap, Two Chairs)    Sit with one leg extended onto facing chair. Loop strap over outstretched foot at ball of big toe. Lengthen spine. Hold for _30 seconds. Repeat __3__ times each leg.  Copyright  VHI. All rights reserved.

## 2018-02-26 NOTE — Therapy (Signed)
[facility] [address] [place], [name], [address] Phone: [phone]   Fax:  [phone]  Physical Therapy Treatment  Patient Details  Name: Brianna Saunders MRN: [id number] Date of Birth: November 02, 1956 Referring Provider: Teresa Coombs   Encounter Date: 02/26/2018  PT End of Session - 02/26/18 1445    Visit Number  2    Number of Visits  16    Date for PT Re-Evaluation  04/02/18    Authorization Type  Medicare    PT Start Time  1325    PT Stop Time  1415    PT Time Calculation (min)  50 min    Equipment Utilized During Treatment  Gait belt    Activity Tolerance  Patient tolerated treatment well    Behavior During Therapy  [facility] for tasks assessed/performed       Past Medical History:  Diagnosis Date  . Chicken pox   . Hypertension     Past Surgical History:  Procedure Laterality Date  . FRACTURE SURGERY    . PARTIAL HYSTERECTOMY    . SPINE SURGERY     lumbar spine- around 2014  . upper back surgery     states plate in neck and into right shoulder blade reportedly- around 2014    There were no vitals filed for this visit.  Subjective Assessment - 02/26/18 1329    Subjective  The patient reports her right shoulder is feeling better since injection.  Her main goal right now is to improve foot clearance on the left foot.  She had a fall last week due to L foot drag.    She notes that she doesn't feel sensation in her leg, and she reports that she has urinary incontinence and has to hurry to get to restroom.     Limitations  Standing;Walking;House hold activities    Patient Stated Goals  Decreased shoulder pain, improved walking, balance,     Currently in Pain?  Yes    Pain Score  -- "I'm always in pain" describing nerve pain.  She reports her arms have nerve pain.           [facility] PT Assessment - 02/26/18 [place] residence    Living Arrangements   Children    Type of [place] Access  Level entry    Home Layout  One level    [place] - 4 wheels gets in the rub      Sensation   Light Touch  Impaired Detail    Additional Comments  has nerve pain bilateral UEs, decreased hot/cold sensation t/o trunk and legs, deep pressure intact in bilat LEs and trunk, and diminished light touch bilat LEs.   She has diminished sensation of bowel until "it's coming".  She notes "I can't feel".   PT inquired about establishing a bowel program, patient states she is not interested in this.    Patient feels like her sensation is "irritated all the time", and patient uses a knife to scratch it so she can feel it.       Posture/Postural Control   Posture/Postural Control  Postural limitations      Tone   Assessment Location  Right Lower Extremity;Left Lower Extremity      ROM / Strength   AROM / PROM / Strength  --      Ambulation/Gait   Gait  velocity  0.55 ft/sec      RLE Tone   RLE Tone  Hypertonic;Modified Ashworth      RLE Tone   Hypertonic Details  incresed muscle tone at knee    Modified Ashworth Scale for Grading Hypertonia RLE  More marked increase in muscle tone through most of the ROM, but affected part(s) easily moved      LLE Tone   LLE Tone  Hypertonic;Modified Ashworth      LLE Tone   Hypertonic Details  clonus on left ankle; patient has tightness in hips, knees and clonus L ankle with downpointing toe at rest.      Modified Ashworth Scale for Grading Hypertonia LLE  Considerable increase in muschle tone, passive movement difficult                   [facility] Adult PT Treatment/Exercise - 02/26/18 1333      Bed Mobility   Bed Mobility  Supine to Sit;Sit to Supine    Supine to Sit  6: Modified independent (Device/Increase time)    Sit to Supine  6: Modified independent (Device/Increase time) patient uses UEs to move L LE onto mat      Transfers   Transfers  Sit to Stand    Sit to Stand  6:  Modified independent (Device/Increase time)    Comments  with UE support to rollator      Ambulation/Gait   Ambulation/Gait  Yes    Ambulation/Gait Assistance  5: Supervision    Ambulation/Gait Assistance Details  PT adjusted patient's rollator height from 5 holes viewed on handles down to 2 holes viewed.  *PT asked her to keep it this way to reduce tension on UEs and improve positioning.    Ambulation Distance (Feet)  130 Feet    Assistive device  4-wheeled walker    Gait Pattern  Poor foot clearance - left;Poor foot clearance - right;Ataxic    Gait Comments  Patient leans anteriorly in sagittal plan with patient loading through UEs and walking on toes.  PT encourages her to bring weight through heels and hit heel strike bilaterally.      Posture/Postural Control   Posture Comments  keeps shoulders elevated towards ears and maintains anterior leaning into rollator      Self-Care   Self-Care  Other Self-Care Comments    Other Self-Care Comments   PT and patient talked about her intentions and needs with therapy.  She reports prior PT in which she warmed up on a bike and then did minimal exercise.  This was not successful.  PT and patient discussed goals for PT to include modifying mechanics to bear weight through heels and slow down to hit heel strike.  She has motor abilities to lift toes, but due to sensory changes is not aware of mechanics.  We also discussed need for carryover to home with PT initiating HEP today.        Exercises   Exercises  Other Exercises    Other Exercises   LE stretching with butterfly stretch and heel cord stretch               PT Short Term Goals - 02/26/18 1455      PT SHORT TERM GOAL #1   Title  Pt to be independent with initial HEP for R shoulder     Baseline  *modified short term date as this is first return since eval    Time  4  Period  Weeks    Status  Revised    Target Date  03/28/18      PT SHORT TERM GOAL #2   Title  The patient  will trial AFO L LE to determine options to improve foot clearance for improved gait efficiency and safety.    Time  4    Period  Weeks    Status  New    Target Date  03/28/18      PT SHORT TERM GOAL #3   Title  The patient will return demo HEP for LE stretching to help with management of chronic hypertonicity/spasticity.    Time  4    Period  Weeks    Target Date  03/28/18      PT SHORT TERM GOAL #4   Title  The patient will improve gait speed from 0.55 ft/sec to > or equal to 0.8 ft/sec to demo improving efficiency with gait for household mobility.    Time  4    Period  Weeks    Target Date  03/28/18        PT Long Term Goals - 02/26/18 1507      PT LONG TERM GOAL #1   Title  The patient will verbalize understanding of progression of HEP or community wellness options for post d/c from therapy.    Time  8    Period  Weeks    Target Date  04/27/18      PT LONG TERM GOAL #2   Title  The patient will improve gait speed from 0.55 ft/sec to 1.0 ft/sec to demo improving gait efficiency.    Time  8    Period  Weeks    Target Date  04/27/18      PT LONG TERM GOAL #3   Title  The patient will be further assessed on Berg balance scale and LTG to follow.    Time  8    Period  Weeks    Target Date  04/27/18      PT LONG TERM GOAL #4   Title  The patient will move floor<>stand with UE support mod indep due to h/o falls.    Time  8    Period  Weeks    Target Date  04/27/18      PT LONG TERM GOAL #5   Title  The patient will be further assessed on stairs and LTG to follow.    Time  8    Period  Weeks    Target Date  04/27/18            Plan - 02/26/18 1510    Clinical Impression Statement  The patient is a 62 yo female transferred to neuro clinic from [facility] clinic due to h/o neurologic impairment.  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  She also had L4 laminectomy and decompression  surgery.  She presents with improved shoulder pain today since having R shoulder injection.  She has impairments of spasticity worse on L LE, clonus L LE, nerve pain in bilateral UEs, diminished sensation bilateral LEs and trunk, muscle weakness, imbalance, and abnormal gait.  Patient ambulates mostly at household ambulation functional level with L foot drag and occasional falls.  PT to address deficits to promote improved mobility and safety.    PT Frequency  2x / week    PT Duration  8 weeks    PT Treatment/Interventions  ADLs/Self  Care Home Management;Cryotherapy;Electrical Stimulation;Moist Heat;Therapeutic exercise;Therapeutic activities;Functional mobility training;Stair training;Gait training;DME Instruction;Ultrasound;Balance training;Neuromuscular re-education;Patient/family education;Orthotic Fit/Training;Prosthetic Training;Wheelchair mobility training;Manual techniques;Taping;Energy conservation;Passive range of motion    PT Next Visit Plan  check HEP, gait training ? try loftstrands possibly down the road, standing balance with lofstrands, trunk stretching, quadriped activities adding trunk rotation, further HEP additions    Consulted and Agree with Plan of Care  Patient       Patient will benefit from skilled therapeutic intervention in order to improve the following deficits and impairments:  Abnormal gait, Decreased endurance, Hypomobility, Impaired sensation, Decreased knowledge of precautions, Decreased activity tolerance, Decreased knowledge of use of DME, Decreased strength, Impaired UE functional use, Pain, Difficulty walking, Decreased mobility, Decreased balance, Decreased range of motion, Impaired perceived functional ability, Improper body mechanics, Impaired flexibility, Decreased safety awareness, Decreased coordination, Increased muscle spasms  Visit Diagnosis: Other abnormalities of gait and mobility  Muscle weakness (generalized)  Muscle spasticity     Problem  List Patient Active Problem List   Diagnosis Date Noted  . Difficulty walking 01/28/2018  . Cigarette smoker 12/18/2017  . Hypertension 11/27/2017  . Chronic pain 11/27/2017  . Depression, major, single episode, in partial remission ([place])     [place], PT 02/26/2018, 3:20 PM  [facility] [address] [place], [name], [address] Phone: [phone]   Fax:  [phone]  Name: Kaysie Michelini MRN: [id number] Date of Birth: 11-25-1955

## 2018-03-03 ENCOUNTER — Other Ambulatory Visit: Payer: Self-pay | Admitting: Family Medicine

## 2018-03-07 LAB — COLOGUARD

## 2018-03-11 ENCOUNTER — Ambulatory Visit: Payer: Medicare Other | Admitting: Sports Medicine

## 2018-03-11 ENCOUNTER — Ambulatory Visit: Payer: Medicare Other | Attending: Sports Medicine

## 2018-03-11 DIAGNOSIS — M6281 Muscle weakness (generalized): Secondary | ICD-10-CM | POA: Diagnosis not present

## 2018-03-11 DIAGNOSIS — R2689 Other abnormalities of gait and mobility: Secondary | ICD-10-CM | POA: Diagnosis not present

## 2018-03-11 DIAGNOSIS — Z0289 Encounter for other administrative examinations: Secondary | ICD-10-CM

## 2018-03-11 DIAGNOSIS — M62838 Other muscle spasm: Secondary | ICD-10-CM

## 2018-03-11 NOTE — Patient Instructions (Addendum)
Butterfly, Sitting    Sit straight or with back against wall. Gently push knees toward floor. Hold _30__ seconds. Repeat _3__ times per session. Do _1-2__ sessions per day.  Copyright  VHI. All rights reserved.     Hamstring Stretch, Seated (Strap, Two Chairs)    Sit with one leg extended onto facing chair. Loop strap over outstretched foot at ball of big toe. Lengthen spine. Hold for _30 seconds. Repeat __3__ times each leg.  Copyright  VHI. All rights reserved.   Piriformis Stretch, Sitting    Sit, one ankle on opposite knee, same-side hand on crossed knee. Push down on knee, keeping spine straight. Hold _30__ seconds.  Repeat _3__ times per session per leg. Do _2-3__ sessions per day. PROGRESSION:  Lean torso forward, with flat back, until tension is felt in hamstrings and gluteals of crossed-leg side. Copyright  VHI. All rights reserved.   Shoulder Roll    Move shoulders forward, up, back, then down. Continue circling shoulders backward _10__ times. Do _2-3__ times per day.  Copyright  VHI. All rights reserved.   Feet Apart, Varied Arm Positions - Eyes Open    Chair behind you and locked walker OR kitchen sink in front of you. With eyes open, feet shoulder width apart, arms at your side, look straight ahead at a stationary object. Hold __10-30__ seconds. Repeat __3__ times per session. Do __1__ sessions per day.  Copyright  VHI. All rights reserved.   Functional Quadriceps: Sit to Stand    With walker locked in front of you. Using arm support as needed. Sit on edge of chair, feet flat on floor. Stand upright, extending knees fully. Then slowly sit back down.  Repeat __10__ times per set. Do __1__ sets per session. Do __1__ sessions per day.  [email or web address].exer.us/735   Copyright  VHI. All rights reserved.

## 2018-03-11 NOTE — Therapy (Signed)
[facility] [address] [place], [name], [address] Phone: [phone]   Fax:  [phone]  Physical Therapy Treatment  Patient Details  Name: Brianna Saunders MRN: [id number] Date of Birth: May 18, 1956 Referring Provider: Teresa Coombs, DO   Encounter Date: 03/11/2018  PT End of Session - 03/11/18 1040    Visit Number  3    Number of Visits  16    Date for PT Re-Evaluation  04/02/18    PT Start Time  0949 pt late    PT Stop Time  1028    PT Time Calculation (min)  39 min    Equipment Utilized During Treatment  -- min guard to s prn    Activity Tolerance  Patient tolerated treatment well    Behavior During Therapy  [facility] for tasks assessed/performed       Past Medical History:  Diagnosis Date  . Chicken pox   . Hypertension     Past Surgical History:  Procedure Laterality Date  . FRACTURE SURGERY    . PARTIAL HYSTERECTOMY    . SPINE SURGERY     lumbar spine- around 2014  . upper back surgery     states plate in neck and into right shoulder blade reportedly- around 2014    There were no vitals filed for this visit.  Subjective Assessment - 03/11/18 0952    Subjective  Pt reported she's performed hamstring stretch but has a hard time with the adductor stretch. Pt denied falls but reported close calls due to L foot dragging.     Limitations  Standing;Walking;House hold activities    How long can you stand comfortably?  < 20 minutes    Patient Stated Goals  Decreased shoulder pain, improved walking, balance,     Currently in Pain?  Yes    Pain Score  -- pt unable to rate    Pain Location  Back    Pain Orientation  Lower    Pain Descriptors / Indicators  Tingling;Numbness;Burning    Pain Type  Chronic pain    Pain Radiating Towards  BLE    Pain Onset  More than a month ago    Pain Frequency  Constant    Aggravating Factors   Incr. activity    Pain Relieving Factors  unknown           Therex: Pt performed in seated and standing positions with min guard to S for safety. Cues and demo for technique. No incr. In pain reported. Please see pt instructions for HEP details.                   Balance Exercises - 03/11/18 1045      Balance Exercises: Standing   Standing Eyes Opened  Narrow base of support (BOS);Wide (BOA);Solid surface;5 reps;10 secs;20 secs    Other Standing Exercises  Performed with min guard to S with locked walker in front of pt and chair behind pt. Please see pt instructions for HEP details.         PT Education - 03/11/18 1034    Education provided  Yes    Education Details  PT reviewed and added balance and functional strengthening to HEP. PT also educated pt on potential AFOs to improve L foot clearance, as pt reported she had a very uncomfortable and bulky plastic AFO after d/c from hospital.     Person(s) Educated  Patient    Methods  Explanation;Demonstration;Verbal cues;Handout  Comprehension  Returned demonstration;Verbalized understanding       PT Short Term Goals - 02/26/18 1455      PT SHORT TERM GOAL #1   Title  Pt to be independent with initial HEP for R shoulder     Baseline  *modified short term date as this is first return since eval    Time  4    Period  Weeks    Status  Revised    Target Date  03/28/18      PT SHORT TERM GOAL #2   Title  The patient will trial AFO L LE to determine options to improve foot clearance for improved gait efficiency and safety.    Time  4    Period  Weeks    Status  New    Target Date  03/28/18      PT SHORT TERM GOAL #3   Title  The patient will return demo HEP for LE stretching to help with management of chronic hypertonicity/spasticity.    Time  4    Period  Weeks    Target Date  03/28/18      PT SHORT TERM GOAL #4   Title  The patient will improve gait speed from 0.55 ft/sec to > or equal to 0.8 ft/sec to demo improving efficiency with gait for household  mobility.    Time  4    Period  Weeks    Target Date  03/28/18        PT Long Term Goals - 02/26/18 1507      PT LONG TERM GOAL #1   Title  The patient will verbalize understanding of progression of HEP or community wellness options for post d/c from therapy.    Time  8    Period  Weeks    Target Date  04/27/18      PT LONG TERM GOAL #2   Title  The patient will improve gait speed from 0.55 ft/sec to 1.0 ft/sec to demo improving gait efficiency.    Time  8    Period  Weeks    Target Date  04/27/18      PT LONG TERM GOAL #3   Title  The patient will be further assessed on Berg balance scale and LTG to follow.    Time  8    Period  Weeks    Target Date  04/27/18      PT LONG TERM GOAL #4   Title  The patient will move floor<>stand with UE support mod indep due to h/o falls.    Time  8    Period  Weeks    Target Date  04/27/18      PT LONG TERM GOAL #5   Title  The patient will be further assessed on stairs and LTG to follow.    Time  8    Period  Weeks    Target Date  04/27/18            Plan - 03/11/18 1041    Clinical Impression Statement  Today's skilled session focused on establishing balance and functional strengthening HEP. Pt noted to experience incr. postural sway without UE support, especially with narrow BOS. This is likely 2/2 a combination of decr. BLE sensation and muscle weakness. Pt required rest breaks during session 2/2 fatigue. Pt would continue to benefit from skilled PT to improve safety during functional mobility.     PT Frequency  2x / week  PT Duration  8 weeks    PT Treatment/Interventions  ADLs/Self Care Home Management;Cryotherapy;Electrical Stimulation;Moist Heat;Therapeutic exercise;Therapeutic activities;Functional mobility training;Stair training;Gait training;DME Instruction;Ultrasound;Balance training;Neuromuscular re-education;Patient/family education;Orthotic Fit/Training;Prosthetic Training;Wheelchair mobility training;Manual  techniques;Taping;Energy conservation;Passive range of motion    PT Next Visit Plan  check new HEP prn, gait training with L AFO? try loftstrands possibly down the road, standing balance with lofstrands, trunk stretching, quadriped activities adding trunk rotation, further HEP additions    Consulted and Agree with Plan of Care  Patient       Patient will benefit from skilled therapeutic intervention in order to improve the following deficits and impairments:  Abnormal gait, Decreased endurance, Hypomobility, Impaired sensation, Decreased knowledge of precautions, Decreased activity tolerance, Decreased knowledge of use of DME, Decreased strength, Impaired UE functional use, Pain, Difficulty walking, Decreased mobility, Decreased balance, Decreased range of motion, Impaired perceived functional ability, Improper body mechanics, Impaired flexibility, Decreased safety awareness, Decreased coordination, Increased muscle spasms  Visit Diagnosis: Other abnormalities of gait and mobility  Muscle weakness (generalized)  Muscle spasticity     Problem List Patient Active Problem List   Diagnosis Date Noted  . Difficulty walking 01/28/2018  . Cigarette smoker 12/18/2017  . Hypertension 11/27/2017  . Chronic pain 11/27/2017  . Depression, major, single episode, in partial remission ([place])     Sireen Halk L 03/11/2018, 10:46 AM  [facility] [address] [place], [name], [address] Phone: [phone]   Fax:  [phone]  Name: Brianna Saunders MRN: [id number] Date of Birth: 1956/07/12  Geoffry Paradise, PT,DPT 03/11/18 10:47 AM Phone: [phone] Fax: [phone]

## 2018-03-13 ENCOUNTER — Encounter: Payer: Self-pay | Admitting: Sports Medicine

## 2018-03-15 ENCOUNTER — Other Ambulatory Visit: Payer: Self-pay | Admitting: Family Medicine

## 2018-03-18 ENCOUNTER — Ambulatory Visit: Payer: Medicare Other

## 2018-03-19 ENCOUNTER — Encounter: Payer: Self-pay | Admitting: Rehabilitative and Restorative Service Providers"

## 2018-03-19 ENCOUNTER — Ambulatory Visit: Payer: Medicare Other | Admitting: Rehabilitative and Restorative Service Providers"

## 2018-03-19 DIAGNOSIS — R2689 Other abnormalities of gait and mobility: Secondary | ICD-10-CM | POA: Diagnosis not present

## 2018-03-19 DIAGNOSIS — M62838 Other muscle spasm: Secondary | ICD-10-CM

## 2018-03-19 DIAGNOSIS — M6281 Muscle weakness (generalized): Secondary | ICD-10-CM | POA: Diagnosis not present

## 2018-03-19 NOTE — Therapy (Signed)
[facility] [address] [place], [name], [address] Phone: [phone]   Fax:  [phone]  Physical Therapy Treatment  Patient Details  Name: Brianna Saunders MRN: [id number] Date of Birth: 15-Jul-1956 Referring Provider: Teresa Coombs, DO   Encounter Date: 03/19/2018  PT End of Session - 03/19/18 0938    Visit Number  4    Number of Visits  16    Date for PT Re-Evaluation  04/02/18    PT Start Time  0934    PT Stop Time  1015    PT Time Calculation (min)  41 min    Equipment Utilized During Treatment  Gait belt min guard to s prn    Activity Tolerance  Patient tolerated treatment well    Behavior During Therapy  [facility] for tasks assessed/performed       Past Medical History:  Diagnosis Date  . Chicken pox   . Hypertension     Past Surgical History:  Procedure Laterality Date  . FRACTURE SURGERY    . PARTIAL HYSTERECTOMY    . SPINE SURGERY     lumbar spine- around 2014  . upper back surgery     states plate in neck and into right shoulder blade reportedly- around 2014    There were no vitals filed for this visit.  Subjective Assessment - 03/19/18 0937    Subjective  "My shoulder is doing good" since injection.  PT inquired about left AFO.  Patient notes she had one, but will not wear it.  It is plastic and uncomfortable and makes her feel like "my leg is not my own."      Patient Stated Goals  Decreased shoulder pain, improved walking, balance,     Currently in Pain?  Yes    Pain Score  -- "always a little bit" "just normal"    Pain Location  Back and legs                       [facility] Adult PT Treatment/Exercise - 03/19/18 0940      Ambulation/Gait   Ambulation/Gait  Yes    Ambulation/Gait Assistance  6: Modified independent (Device/Increase time);4: Min assist;3: Mod assist    Ambulation/Gait Assistance Details  Patient has L foot drag noted when walking without AFO with rollator  RW.  PT educated patient on benefit and need of wearing AFO and showed her off the shelf, carbon lightweight brace options.  We trialed the Ottoback posterior leaf spring today and it controlled foot drop well, however patient can still get into left knee genu recurvatum and overpower the brace at terminal stance (may be able to improve with left heel wedge).  PT had patient ambulate in parallel bars with one UE support with AFO and without UE support x 2 steps (patient fearful and requires min to mod A) with AFO donned.  Also trialed bilateral forearm crutches today with demo and verbal cues for sequencing.    Ambulation Distance (Feet)  150 Feet 50 ft with forearm crutches    Assistive device  4-wheeled walker;Lofstrands      Neuro Re-ed    Neuro Re-ed Details   Stance on left side with R foot on 4" block dec'ing UE support with min A and tactile cues for L kne eposition.      Exercises   Exercises  Other Exercises    Other Exercises   Quadriped trunk rotation x 5 reps right and left  using "thread the needle" stretch; quadriped hip extension bilaterally x 5 reps each side with min A.  Standing side stepping x 10 reps each side R/L; standing knee flexion R x 5 reps with assist and L LE.                PT Short Term Goals - 02/26/18 1455      PT SHORT TERM GOAL #1   Title  Pt to be independent with initial HEP for R shoulder     Baseline  *modified short term date as this is first return since eval    Time  4    Period  Weeks    Status  Revised    Target Date  03/28/18      PT SHORT TERM GOAL #2   Title  The patient will trial AFO L LE to determine options to improve foot clearance for improved gait efficiency and safety.    Time  4    Period  Weeks    Status  New    Target Date  03/28/18      PT SHORT TERM GOAL #3   Title  The patient will return demo HEP for LE stretching to help with management of chronic hypertonicity/spasticity.    Time  4    Period  Weeks    Target  Date  03/28/18      PT SHORT TERM GOAL #4   Title  The patient will improve gait speed from 0.55 ft/sec to > or equal to 0.8 ft/sec to demo improving efficiency with gait for household mobility.    Time  4    Period  Weeks    Target Date  03/28/18        PT Long Term Goals - 02/26/18 1507      PT LONG TERM GOAL #1   Title  The patient will verbalize understanding of progression of HEP or community wellness options for post d/c from therapy.    Time  8    Period  Weeks    Target Date  04/27/18      PT LONG TERM GOAL #2   Title  The patient will improve gait speed from 0.55 ft/sec to 1.0 ft/sec to demo improving gait efficiency.    Time  8    Period  Weeks    Target Date  04/27/18      PT LONG TERM GOAL #3   Title  The patient will be further assessed on Berg balance scale and LTG to follow.    Time  8    Period  Weeks    Target Date  04/27/18      PT LONG TERM GOAL #4   Title  The patient will move floor<>stand with UE support mod indep due to h/o falls.    Time  8    Period  Weeks    Target Date  04/27/18      PT LONG TERM GOAL #5   Title  The patient will be further assessed on stairs and LTG to follow.    Time  8    Period  Weeks    Target Date  04/27/18            Plan - 03/19/18 1135    Clinical Impression Statement  The patient's gait is significantly improved with use of L posterior leaf AFO (off the shelf Ottobach used).  PT to continue to progress gait and retrain standing position to  discourage anterior lean she gets with rollater RW.  Plan to continue to trial different off the shelf braces and discuss further with patient (as she has plastic one she does not wear).  Discussed goal of improving gait efficiency with patient and normalizing gait pattern.  She appears willing to use AFO at this time.    PT Treatment/Interventions  ADLs/Self Care Home Management;Cryotherapy;Electrical Stimulation;Moist Heat;Therapeutic exercise;Therapeutic  activities;Functional mobility training;Stair training;Gait training;DME Instruction;Ultrasound;Balance training;Neuromuscular re-education;Patient/family education;Orthotic Fit/Training;Prosthetic Training;Wheelchair mobility training;Manual techniques;Taping;Energy conservation;Passive range of motion    PT Next Visit Plan  check new Hep as needed, try other off the shelf AFOs with rollator and then progress to loftstrands.  Trunk stretching, isolated L motor control, standing balance.    Consulted and Agree with Plan of Care  Patient       Patient will benefit from skilled therapeutic intervention in order to improve the following deficits and impairments:  Abnormal gait, Decreased endurance, Hypomobility, Impaired sensation, Decreased knowledge of precautions, Decreased activity tolerance, Decreased knowledge of use of DME, Decreased strength, Impaired UE functional use, Pain, Difficulty walking, Decreased mobility, Decreased balance, Decreased range of motion, Impaired perceived functional ability, Improper body mechanics, Impaired flexibility, Decreased safety awareness, Decreased coordination, Increased muscle spasms  Visit Diagnosis: Other abnormalities of gait and mobility  Muscle weakness (generalized)  Muscle spasticity     Problem List Patient Active Problem List   Diagnosis Date Noted  . Difficulty walking 01/28/2018  . Cigarette smoker 12/18/2017  . Hypertension 11/27/2017  . Chronic pain 11/27/2017  . Depression, major, single episode, in partial remission ([place])     [place], PT 03/19/2018, 11:37 AM  [facility] [address] [place], [name], [address] Phone: [phone]   Fax:  [phone]  Name: Brianna Saunders MRN: [id number] Date of Birth: January 10, 1956

## 2018-03-20 ENCOUNTER — Telehealth: Payer: Self-pay | Admitting: Family Medicine

## 2018-03-20 NOTE — Telephone Encounter (Signed)
Patient is requesting an update on her lab results that she recently had done. Please contact patient to discuss.

## 2018-03-20 NOTE — Telephone Encounter (Signed)
I called and spoke to the patient and advised the out of courtesy we would waive the 5/1 visit with Dr. Paulla Fore due to confusion of who to see for her shoulder. I am emailing charge corrections to waive the $50 fee.

## 2018-03-20 NOTE — Telephone Encounter (Signed)
Copied from [employer] [id number]. Topic: Bill or Statement - Patient/Guarantor Inquiry >> Mar 20, 2018  2:39 PM Rutherford Nail, NT wrote: Patient calling and states she received a bill  Patient received a bill for no show for 03/11/18. Patient was confused as to why she received the bill. States she thought that she was seeing Physical therapy for her shoulder pain instead of Dr Paulla Fore. States she has no recollection of this appointment. Please advise. CB#: [phone]

## 2018-03-26 ENCOUNTER — Ambulatory Visit: Payer: Medicare Other | Admitting: Rehabilitative and Restorative Service Providers"

## 2018-03-26 NOTE — Telephone Encounter (Signed)
Returned patient's call. No answer was received and I was unable to leave a voicemail message as her voicemail hasn't been set up

## 2018-04-01 ENCOUNTER — Encounter: Payer: Self-pay | Admitting: Rehabilitative and Restorative Service Providers"

## 2018-04-01 ENCOUNTER — Ambulatory Visit: Payer: Medicare Other | Admitting: Rehabilitative and Restorative Service Providers"

## 2018-04-01 DIAGNOSIS — M62838 Other muscle spasm: Secondary | ICD-10-CM | POA: Diagnosis not present

## 2018-04-01 DIAGNOSIS — R2689 Other abnormalities of gait and mobility: Secondary | ICD-10-CM | POA: Diagnosis not present

## 2018-04-01 DIAGNOSIS — M6281 Muscle weakness (generalized): Secondary | ICD-10-CM

## 2018-04-01 NOTE — Therapy (Signed)
[facility] [address] [place], [name], [address] Phone: [phone]   Fax:  [phone]  Physical Therapy Treatment  Patient Details  Name: Brianna Saunders MRN: [id number] Date of Birth: Dec 09, 1955 Referring Provider: Teresa Coombs, DO   Encounter Date: 04/01/2018  PT End of Session - 04/01/18 1442    Visit Number  5    Number of Visits  16    Date for PT Re-Evaluation  04/02/18    Authorization Type  Medicare    PT Start Time  1105    PT Stop Time  1145    PT Time Calculation (min)  40 min    Equipment Utilized During Treatment  Gait belt min guard to s prn    Activity Tolerance  Patient tolerated treatment well    Behavior During Therapy  [facility] for tasks assessed/performed       Past Medical History:  Diagnosis Date  . Chicken pox   . Hypertension     Past Surgical History:  Procedure Laterality Date  . FRACTURE SURGERY    . PARTIAL HYSTERECTOMY    . SPINE SURGERY     lumbar spine- around 2014  . upper back surgery     states plate in neck and into right shoulder blade reportedly- around 2014    There were no vitals filed for this visit.  Subjective Assessment - 04/01/18 1108    Subjective  The patient reports she is doing about the same.  Shoulder is doing good since injection.   Patient inquires about what her actual condition is.      Patient Stated Goals  Decreased shoulder pain, improved walking, balance,     Currently in Pain?  Yes    Pain Score  -- varies, none at rest                       [facility] Adult PT Treatment/Exercise - 04/01/18 1115      Ambulation/Gait   Ambulation/Gait  Yes    Ambulation/Gait Assistance  6: Modified independent (Device/Increase time)    Ambulation/Gait Assistance Details  Trialed anterior loading toe off brace today.  Also noted overpronation bilaterally L>R.  The patient continued with intermittent L toe drag with anterior loading brace.   She prefers posterior loaded brace-- PT to request AFO order and we will consult orthotist to schedule.    Ambulation Distance (Feet)  230 Feet    Assistive device  4-wheeled walker    Ambulation Surface  Level;Indoor    Gait Comments  PT provided tactile cues for L LE to avoid steppage gait and work on "dropping the hip/knee" to initiate swing phase.      Self-Care   Self-Care  Other Self-Care Comments    Other Self-Care Comments   PT provided information (online pictures shown) and discussed her medical condition.  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  Also discussed cervical myelopathy and its symptoms and why surgery was indicated when she developed these symptoms.  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  Patient wanted condition names written as she notes her kids aren't sure what the issue is with her neck.      Neuro Re-ed    Neuro Re-ed Details   Wall bumps with eyes open, then eyes closed performing anterior/posterior weight shifting.       Exercises   Exercises  Other Exercises    Other Exercises   Standing trunk roaiton, flexion/extension, standing hip adductor stretch leaning on countertop.                 PT Short Term Goals - 04/01/18 1443      PT SHORT TERM GOAL #1   Title  Pt to be independent with initial HEP for R shoulder     Baseline  No further shoulder pain since MD injection completed.     Time  4    Period  Weeks    Status  Deferred      PT SHORT TERM GOAL #2   Title  The patient will trial AFO L LE to determine options to improve foot clearance for improved gait efficiency and safety.    Baseline  PT to request order.  We have trialed posterior carbon brace and anterior loading (performed better with posterior brace, however still had mild recurvatum).    Time  4    Period  Weeks    Status  Achieved      PT SHORT TERM GOAL #3   Title  The patient will return demo  HEP for LE stretching to help with management of chronic hypertonicity/spasticity.    Baseline  Notes HEP going well.    Time  4    Period  Weeks    Status  Achieved      PT SHORT TERM GOAL #4   Title  The patient will improve gait speed from 0.55 ft/sec to > or equal to 0.8 ft/sec to demo improving efficiency with gait for household mobility.    Time  4    Period  Weeks        PT Long Term Goals - 02/26/18 1507      PT LONG TERM GOAL #1   Title  The patient will verbalize understanding of progression of HEP or community wellness options for post d/c from therapy.    Time  8    Period  Weeks    Target Date  04/27/18      PT LONG TERM GOAL #2   Title  The patient will improve gait speed from 0.55 ft/sec to 1.0 ft/sec to demo improving gait efficiency.    Time  8    Period  Weeks    Target Date  04/27/18      PT LONG TERM GOAL #3   Title  The patient will be further assessed on Berg balance scale and LTG to follow.    Time  8    Period  Weeks    Target Date  04/27/18      PT LONG TERM GOAL #4   Title  The patient will move floor<>stand with UE support mod indep due to h/o falls.    Time  8    Period  Weeks    Target Date  04/27/18      PT LONG TERM GOAL #5   Title  The patient will be further assessed on stairs and LTG to follow.    Time  8    Period  Weeks    Target Date  04/27/18            Plan - 04/01/18 1451    Clinical Impression Statement  The patient responded better to posterior loading brace at last session as compared to toe off today.  PT to request AFO order.  Plan to finish checking STGs next visit.  PT Treatment/Interventions  ADLs/Self Care Home Management;Cryotherapy;Electrical Stimulation;Moist Heat;Therapeutic exercise;Therapeutic activities;Functional mobility training;Stair training;Gait training;DME Instruction;Ultrasound;Balance training;Neuromuscular re-education;Patient/family education;Orthotic Fit/Training;Prosthetic  Training;Wheelchair mobility training;Manual techniques;Taping;Energy conservation;Passive range of motion    PT Next Visit Plan  check new Hep as needed, try other off the shelf AFOs with rollator and then progress to loftstrands.  Trunk stretching, isolated L motor control, standing balance.  BIONESS?     Consulted and Agree with Plan of Care  Patient       Patient will benefit from skilled therapeutic intervention in order to improve the following deficits and impairments:  Abnormal gait, Decreased endurance, Hypomobility, Impaired sensation, Decreased knowledge of precautions, Decreased activity tolerance, Decreased knowledge of use of DME, Decreased strength, Impaired UE functional use, Pain, Difficulty walking, Decreased mobility, Decreased balance, Decreased range of motion, Impaired perceived functional ability, Improper body mechanics, Impaired flexibility, Decreased safety awareness, Decreased coordination, Increased muscle spasms  Visit Diagnosis: Other abnormalities of gait and mobility  Muscle weakness (generalized)  Muscle spasticity     Problem List Patient Active Problem List   Diagnosis Date Noted  . Difficulty walking 01/28/2018  . Cigarette smoker 12/18/2017  . Hypertension 11/27/2017  . Chronic pain 11/27/2017  . Depression, major, single episode, in partial remission ([place])     [place] 04/01/2018, 3:03 PM  [facility] [address] [place], [name], [address] Phone: [phone]   Fax:  [phone]  Name: Amylia Collazos MRN: [id number] Date of Birth: 27-Feb-1956

## 2018-04-02 ENCOUNTER — Ambulatory Visit: Payer: Medicare Other | Admitting: Rehabilitative and Restorative Service Providers"

## 2018-04-02 ENCOUNTER — Telehealth: Payer: Self-pay | Admitting: Rehabilitative and Restorative Service Providers"

## 2018-04-02 ENCOUNTER — Encounter: Payer: Self-pay | Admitting: Rehabilitative and Restorative Service Providers"

## 2018-04-02 DIAGNOSIS — M6281 Muscle weakness (generalized): Secondary | ICD-10-CM | POA: Diagnosis not present

## 2018-04-02 DIAGNOSIS — M62838 Other muscle spasm: Secondary | ICD-10-CM

## 2018-04-02 DIAGNOSIS — R2689 Other abnormalities of gait and mobility: Secondary | ICD-10-CM

## 2018-04-02 NOTE — Therapy (Signed)
[facility] [address] [place], [name], [address] Phone: [phone]   Fax:  [phone]  Physical Therapy Treatment  Patient Details  Name: Brianna Saunders MRN: [id number] Date of Birth: 1956/10/17 Referring Provider: Teresa Coombs, DO   Encounter Date: 04/02/2018  PT End of Session - 04/02/18 1157    Visit Number  6    Number of Visits  16    Date for PT Re-Evaluation  04/27/18    Authorization Type  Medicare    PT Start Time  1102    PT Stop Time  1150    PT Time Calculation (min)  48 min    Equipment Utilized During Treatment  Gait belt min guard to s prn    Activity Tolerance  Patient tolerated treatment well    Behavior During Therapy  [facility] for tasks assessed/performed       Past Medical History:  Diagnosis Date  . Chicken pox   . Hypertension     Past Surgical History:  Procedure Laterality Date  . FRACTURE SURGERY    . PARTIAL HYSTERECTOMY    . SPINE SURGERY     lumbar spine- around 2014  . upper back surgery     states plate in neck and into right shoulder blade reportedly- around 2014    There were no vitals filed for this visit.  Subjective Assessment - 04/02/18 1111    Subjective  The patient was stiff in her neck after yesterday's session.  She notes she stretched last night to help.     Patient Stated Goals  Decreased shoulder pain, improved walking, balance,     Currently in Pain?  Yes    Pain Score  -- none at rest                       [facility] Adult PT Treatment/Exercise - 04/02/18 1112      Ambulation/Gait   Ambulation/Gait  Yes    Ambulation/Gait Assistance  6: Modified independent (Device/Increase time)    Ambulation/Gait Assistance Details  multiple episodes of left foot drag entering clinic    Ambulation Distance (Feet)  230 [place] device  4-wheeled walker;Lofstrands    Ambulation Surface  Level;Indoor    Gait velocity  1.38 ft/sec    Gait Comments  Gait x 230 feet with rollator with close supervision with L posterior ottobach brace and heel wedge.   Then performed gait with lofstrand cruttches.  x 115 ft with CGA to min A with verbal cues on sequencing.  Then lowered crutches and did 230 feet initially with 4 point gait pattern and then progressed to 2 point gait pattern.  Rested and then had +2 assistance (for safety) trialing one crutch in right hand and no device L hand.  Patient performed 115 feet with one loss of balance requiring min A +2 for safety.               PT Short Term Goals - 04/02/18 1157      PT SHORT TERM GOAL #1   Title  Pt to be independent with initial HEP for R shoulder     Baseline  No further shoulder pain since MD injection completed.     Time  4    Period  Weeks    Status  Deferred      PT SHORT TERM GOAL #2   Title  The patient will trial AFO L LE  to determine options to improve foot clearance for improved gait efficiency and safety.    Baseline  PT to request order.  We have trialed posterior carbon brace and anterior loading (performed better with posterior brace, however still had mild recurvatum).    Time  4    Period  Weeks    Status  Achieved      PT SHORT TERM GOAL #3   Title  The patient will return demo HEP for LE stretching to help with management of chronic hypertonicity/spasticity.    Baseline  Notes HEP going well.    Time  4    Period  Weeks    Status  Achieved      PT SHORT TERM GOAL #4   Title  The patient will improve gait speed from 0.55 ft/sec to > or equal to 0.8 ft/sec to demo improving efficiency with gait for household mobility.    Baseline  Met on 04/02/17 with 1.38 ft/sec using rollator and L AFO (posterior ottobach).      Time  4    Period  Weeks    Status  Achieved        PT Long Term Goals - 02/26/18 1507      PT LONG TERM GOAL #1   Title  The patient will verbalize understanding of progression of HEP or community wellness options for post d/c  from therapy.    Time  8    Period  Weeks    Target Date  04/27/18      PT LONG TERM GOAL #2   Title  The patient will improve gait speed from 0.55 ft/sec to 1.0 ft/sec to demo improving gait efficiency.    Time  8    Period  Weeks    Target Date  04/27/18      PT LONG TERM GOAL #3   Title  The patient will be further assessed on Berg balance scale and LTG to follow.    Time  8    Period  Weeks    Target Date  04/27/18      PT LONG TERM GOAL #4   Title  The patient will move floor<>stand with UE support mod indep due to h/o falls.    Time  8    Period  Weeks    Target Date  04/27/18      PT LONG TERM GOAL #5   Title  The patient will be further assessed on stairs and LTG to follow.    Time  8    Period  Weeks    Target Date  04/27/18            Plan - 04/02/18 1158    Clinical Impression Statement  The patient met 3 LTGs.  PT to continue to work towards LTGs emphasizing increasing mobility in home/community.  PT to look for AFO order and begin bioness training in clinic for motor re-learning.  Patient motivated and eager to participate.     PT Treatment/Interventions  ADLs/Self Care Home Management;Cryotherapy;Electrical Stimulation;Moist Heat;Therapeutic exercise;Therapeutic activities;Functional mobility training;Stair training;Gait training;DME Instruction;Ultrasound;Balance training;Neuromuscular re-education;Patient/family education;Orthotic Fit/Training;Prosthetic Training;Wheelchair mobility training;Manual techniques;Taping;Energy conservation;Passive range of motion    PT Next Visit Plan  check new Hep as needed, try other off the shelf AFOs with rollator and then progress to loftstrands.  Trunk stretching, isolated L motor control, standing balance.  BIONESS.  BERG.    Consulted and Agree with Plan of Care  Patient  Patient will benefit from skilled therapeutic intervention in order to improve the following deficits and impairments:  Abnormal gait, Decreased  endurance, Hypomobility, Impaired sensation, Decreased knowledge of precautions, Decreased activity tolerance, Decreased knowledge of use of DME, Decreased strength, Impaired UE functional use, Pain, Difficulty walking, Decreased mobility, Decreased balance, Decreased range of motion, Impaired perceived functional ability, Improper body mechanics, Impaired flexibility, Decreased safety awareness, Decreased coordination, Increased muscle spasms  Visit Diagnosis: Other abnormalities of gait and mobility  Muscle weakness (generalized)  Muscle spasticity     Problem List Patient Active Problem List   Diagnosis Date Noted  . Difficulty walking 01/28/2018  . Cigarette smoker 12/18/2017  . Hypertension 11/27/2017  . Chronic pain 11/27/2017  . Depression, major, single episode, in partial remission ([place])     [place], PT 04/02/2018, 11:59 AM  [facility] [address] [place], [name], [address] Phone: [phone]   Fax:  [phone]  Name: Brianna Saunders MRN: [id number] Date of Birth: June 13, 1956

## 2018-04-02 NOTE — Telephone Encounter (Signed)
Dr. 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  She will benefit from use of left AFO in order to improve safety with functional mobility.    If you agree, please submit request in EPIC under referral for DME (list left AFO in comments) or fax to [facility] at [phone].   *Her gait speed improves from 0.55 ft/sec up to 1.38 ft/sec with rollator RW and left AFO trial.  Thank you, [place], [place] [address] [place], [place]  [address] Phone:  [phone] Fax:  [phone]

## 2018-04-07 ENCOUNTER — Other Ambulatory Visit: Payer: Self-pay

## 2018-04-07 DIAGNOSIS — R269 Unspecified abnormalities of gait and mobility: Secondary | ICD-10-CM

## 2018-04-07 DIAGNOSIS — M25511 Pain in right shoulder: Secondary | ICD-10-CM

## 2018-04-07 NOTE — Telephone Encounter (Signed)
Order has been placed.   Brianna Saunders, please let me know if it is not placed correctly, thanks!

## 2018-04-07 NOTE — Telephone Encounter (Signed)
Please send Rx as requested

## 2018-04-08 ENCOUNTER — Ambulatory Visit: Payer: Medicare Other | Admitting: Rehabilitative and Restorative Service Providers"

## 2018-04-08 DIAGNOSIS — M6281 Muscle weakness (generalized): Secondary | ICD-10-CM | POA: Diagnosis not present

## 2018-04-08 DIAGNOSIS — R2689 Other abnormalities of gait and mobility: Secondary | ICD-10-CM | POA: Diagnosis not present

## 2018-04-08 DIAGNOSIS — M62838 Other muscle spasm: Secondary | ICD-10-CM

## 2018-04-09 ENCOUNTER — Telehealth: Payer: Self-pay | Admitting: Family Medicine

## 2018-04-09 ENCOUNTER — Encounter: Payer: Self-pay | Admitting: Rehabilitative and Restorative Service Providers"

## 2018-04-09 ENCOUNTER — Ambulatory Visit: Payer: Medicare Other | Admitting: Rehabilitative and Restorative Service Providers"

## 2018-04-09 NOTE — Telephone Encounter (Signed)
I appreciate physical therapy see back-we will refer to neurology at their request.

## 2018-04-09 NOTE — Telephone Encounter (Signed)
-----   Message from Mervyn Gay, PT sent at 04/09/2018 10:35 AM EDT ----- Dr. Yong Channel, Casimiro Needle has been seen 7 visits in PT (referred by Dr. Paulla Fore).  On 5/23, she reported h/o paresthesias since her neck surgery in 2014 described as hypersensitivity in the axillary region bilaterally.  She is a poor historian and at that time, my impression was that this symptom has been present for years.  At today's visit, she described further that she is now having to wear only tank tops due to axillary sensitivity and that this symptom is changing/worsening.  She also notes hypersensitivity in right scapular region is worsening (so much that at times she tries to use a knife to scratch her scapula-- we discussed that this is dangerous as she could break the skin due to impaired sensation).    I asked her about being established with neurology as there is a consult at [facility] in 2017, however she reports she is no longer going to [facility] for care due to moving to [place].  She may benefit from neurology consult to evaluate worsening paresthesias in axillary and right scapular region.    Thank you, [place], [place] [address] [place]  [address] Phone:  [phone] Fax:  [phone]

## 2018-04-09 NOTE — Therapy (Signed)
[facility] [address] [place], [name], [address] Phone: [phone]   Fax:  [phone]  Physical Therapy Treatment  Patient Details  Name: Brianna Saunders MRN: [id number] Date of Birth: 1956/03/29 Referring Provider: Teresa Coombs, DO   Encounter Date: 04/08/2018  PT End of Session - 04/08/18 1327    Visit Number  7    Number of Visits  17    Date for PT Re-Evaluation  04/27/18    Authorization Type  Medicare    PT Start Time  1325    PT Stop Time  1419    PT Time Calculation (min)  54 min    Equipment Utilized During Treatment  Gait belt    Activity Tolerance  Patient tolerated treatment well    Behavior During Therapy  [facility] for tasks assessed/performed       Past Medical History:  Diagnosis Date  . Chicken pox   . Hypertension     Past Surgical History:  Procedure Laterality Date  . FRACTURE SURGERY    . PARTIAL HYSTERECTOMY    . SPINE SURGERY     lumbar spine- around 2014  . upper back surgery     states plate in neck and into right shoulder blade reportedly- around 2014    There were no vitals filed for this visit.  Subjective Assessment - 04/09/18 0914    Subjective  The patient reports that she is continuing with worsening paresthesias under her arms.  PT and patient discussed *see self care.    Patient Stated Goals  Decreased shoulder pain, improved walking, balance,     Currently in Pain?  Yes    Pain Score  -- general stiffness/ not rated    Pain Onset  More than a month ago    Pain Frequency  Constant         [facility] PT Assessment - 04/08/18 1337      Assessment   Medical Diagnosis  a      Standardized Balance Assessment   Standardized Balance Assessment  Berg Balance Test      Berg Balance Test   Sit to Stand  Able to stand  independently using hands    Standing Unsupported  Able to stand 30 seconds unsupported    Sitting with Back Unsupported but Feet Supported on  Floor or Stool  Able to sit safely and securely 2 minutes    Stand to Sit  Controls descent by using hands    Transfers  Able to transfer safely, definite need of hands    Standing Unsupported with Eyes Closed  Able to stand 10 seconds with supervision    Standing Ubsupported with Feet Together  Needs help to attain position but able to stand for 30 seconds with feet together    From Standing, Reach Forward with Outstretched Arm  Reaches forward but needs supervision    From Standing Position, Pick up Object from Floor  Unable to try/needs assist to keep balance    From Standing Position, Turn to Look Behind Over each Shoulder  Needs supervision when turning    Turn 360 Degrees  Needs assistance while turning    Standing Unsupported, Alternately Place Feet on Step/Stool  Needs assistance to keep from falling or unable to try    Standing Unsupported, One Foot in Front  Loses balance while stepping or standing    Standing on One Leg  Unable to try or needs assist to prevent fall  Total Score  21    Berg comment:  21/56 indicating high fall risk                   [facility] Adult PT Treatment/Exercise - 04/09/18 0921      Ambulation/Gait   Ambulation/Gait  Yes    Ambulation/Gait Assistance  4: Min guard;4: Min assist;6: Modified independent (Device/Increase time)    Ambulation/Gait Assistance Details  Patient is mod indep with rollator walking into clinic.  PT then provided CGA to min A when ambulating trialing Bioness on L LE for ankle dorsiflexion.  PT initially walked 230 ft with rollator and then 115 ft with forearm crutches (bilat) and Bioness unit.    Ambulation Distance (Feet)  230 Feet 115, 100 x 2    Assistive device  4-wheeled walker;Lofstrands    Ambulation Surface  Level;Indoor      Neuro Re-ed    Neuro Re-ed Details   Standing rocking anteriorly/posteriorly with ankle DF for weight shifting.      Exercises   Exercises  Other Exercises    Other Exercises   Quadriped:   trunk rotation for thread the needle to reaching to the ceiling.  Standing heel cord stretch utilizing bioness to intensify stretch.  Stretch seated with L ankle on rocker board using bioness in training mode to stimulate anterior tibialis.      Modalities   Modalities  Electrical Stimulation      Electrical Stimulation   Electrical Stimulation Location  left anterior tibialis    Electrical Stimulation Action  ankle dorsiflexion during gait    Electrical Stimulation Parameters  see bioness unit tablet 2    Electrical Stimulation Goals  Neuromuscular facilitation             PT Education - 04/09/18 0919    Education provided  Yes    Education Details  Patient reports that hypersensitivity and paresthesias are worsening.  PT inquired in multiple ways to ensure understanding as patient is a poor historian.  Last session, she mentioned this, but it appeared to be since surgery.  At today's session, this was further clarified and patient notes worsening paresthesias describing she has to sleep with her arms up due to axillary hypersensitivity, and also describes worsening hypersensitivity of the R scapular region.    Person(s) Educated  Patient    Methods  Explanation    Comprehension  Verbalized understanding       PT Short Term Goals - 04/02/18 1157      PT SHORT TERM GOAL #1   Title  Pt to be independent with initial HEP for R shoulder     Baseline  No further shoulder pain since MD injection completed.     Time  4    Period  Weeks    Status  Deferred      PT SHORT TERM GOAL #2   Title  The patient will trial AFO L LE to determine options to improve foot clearance for improved gait efficiency and safety.    Baseline  PT to request order.  We have trialed posterior carbon brace and anterior loading (performed better with posterior brace, however still had mild recurvatum).    Time  4    Period  Weeks    Status  Achieved      PT SHORT TERM GOAL #3   Title  The patient will  return demo HEP for LE stretching to help with management of chronic hypertonicity/spasticity.    Baseline  Notes HEP going well.    Time  4    Period  Weeks    Status  Achieved      PT SHORT TERM GOAL #4   Title  The patient will improve gait speed from 0.55 ft/sec to > or equal to 0.8 ft/sec to demo improving efficiency with gait for household mobility.    Baseline  Met on 04/02/17 with 1.38 ft/sec using rollator and L AFO (posterior ottobach).      Time  4    Period  Weeks    Status  Achieved        PT Long Term Goals - 02/26/18 1507      PT LONG TERM GOAL #1   Title  The patient will verbalize understanding of progression of HEP or community wellness options for post d/c from therapy.    Time  8    Period  Weeks    Target Date  04/27/18      PT LONG TERM GOAL #2   Title  The patient will improve gait speed from 0.55 ft/sec to 1.0 ft/sec to demo improving gait efficiency.    Time  8    Period  Weeks    Target Date  04/27/18      PT LONG TERM GOAL #3   Title  The patient will be further assessed on Berg balance scale and LTG to follow.    Time  8    Period  Weeks    Target Date  04/27/18      PT LONG TERM GOAL #4   Title  The patient will move floor<>stand with UE support mod indep due to h/o falls.    Time  8    Period  Weeks    Target Date  04/27/18      PT LONG TERM GOAL #5   Title  The patient will be further assessed on stairs and LTG to follow.    Time  8    Period  Weeks    Target Date  04/27/18            Plan - 04/09/18 0926    Clinical Impression Statement  The patient tolerated e-stim set up and gait activities well with Bioness today.  She demonstrates improved L foot clearance and more fluid gait pattern.  PT working to get AFO (order received) and plan to set up consult with Adv O/P.      PT Treatment/Interventions  ADLs/Self Care Home Management;Cryotherapy;Electrical Stimulation;Moist Heat;Therapeutic exercise;Therapeutic activities;Functional  mobility training;Stair training;Gait training;DME Instruction;Ultrasound;Balance training;Neuromuscular re-education;Patient/family education;Orthotic Fit/Training;Prosthetic Training;Wheelchair mobility training;Manual techniques;Taping;Energy conservation;Passive range of motion    PT Next Visit Plan  check new Hep as needed, try other off the shelf AFOs with rollator and then progress to loftstrands.  Trunk stretching, isolated L motor control, standing balance.  BIONESS.  BERG.    Consulted and Agree with Plan of Care  Patient       Patient will benefit from skilled therapeutic intervention in order to improve the following deficits and impairments:  Abnormal gait, Decreased endurance, Hypomobility, Impaired sensation, Decreased knowledge of precautions, Decreased activity tolerance, Decreased knowledge of use of DME, Decreased strength, Impaired UE functional use, Pain, Difficulty walking, Decreased mobility, Decreased balance, Decreased range of motion, Impaired perceived functional ability, Improper body mechanics, Impaired flexibility, Decreased safety awareness, Decreased coordination, Increased muscle spasms  Visit Diagnosis: Other abnormalities of gait and mobility  Muscle weakness (generalized)  Muscle spasticity     Problem  List Patient Active Problem List   Diagnosis Date Noted  . Difficulty walking 01/28/2018  . Cigarette smoker 12/18/2017  . Hypertension 11/27/2017  . Chronic pain 11/27/2017  . Depression, major, single episode, in partial remission ([place])     [place], PT 04/09/2018, 9:29 AM  [facility] [address] [place], [name], [address] Phone: [phone]   Fax:  [phone]  Name: Maleena Eddleman MRN: [id number] Date of Birth: October 12, 1956

## 2018-04-10 ENCOUNTER — Other Ambulatory Visit: Payer: Self-pay

## 2018-04-10 DIAGNOSIS — R202 Paresthesia of skin: Secondary | ICD-10-CM

## 2018-04-14 ENCOUNTER — Other Ambulatory Visit: Payer: Self-pay | Admitting: Family Medicine

## 2018-04-15 ENCOUNTER — Ambulatory Visit: Payer: Medicare Other | Attending: Sports Medicine | Admitting: Rehabilitative and Restorative Service Providers"

## 2018-04-15 ENCOUNTER — Encounter: Payer: Self-pay | Admitting: Rehabilitative and Restorative Service Providers"

## 2018-04-15 DIAGNOSIS — M62838 Other muscle spasm: Secondary | ICD-10-CM | POA: Insufficient documentation

## 2018-04-15 DIAGNOSIS — M6281 Muscle weakness (generalized): Secondary | ICD-10-CM | POA: Insufficient documentation

## 2018-04-15 DIAGNOSIS — R2689 Other abnormalities of gait and mobility: Secondary | ICD-10-CM | POA: Insufficient documentation

## 2018-04-15 NOTE — Therapy (Signed)
[facility] [address] [place], [name], [address] Phone: [phone]   Fax:  [phone]  Physical Therapy Treatment  Patient Details  Name: Kaleiyah Polsky MRN: [id number] Date of Birth: [birth year] Referring Provider: Teresa Coombs, DO   Encounter Date: 04/15/2018  PT End of Session - 04/15/18 1117    Visit Number  8    Number of Visits  17    Date for PT Re-Evaluation  04/27/18    Authorization Type  Medicare    PT Start Time  1110    PT Stop Time  1150    PT Time Calculation (min)  40 min    Equipment Utilized During Treatment  Gait belt    Activity Tolerance  Patient tolerated treatment well    Behavior During Therapy  [facility] for tasks assessed/performed       Past Medical History:  Diagnosis Date  . Chicken pox   . Hypertension     Past Surgical History:  Procedure Laterality Date  . FRACTURE SURGERY    . PARTIAL HYSTERECTOMY    . SPINE SURGERY     lumbar spine- around 2014  . upper back surgery     states plate in neck and into right shoulder blade reportedly- around 2014    There were no vitals filed for this visit.  Subjective Assessment - 04/15/18 1113    Subjective  The patient notes walking is going well.  She notes she had a fall yesterday "I slipped up a little" reporting she was walking [place] speed [place] and fell with some abrasion on her knee (didn't feel it on the knee).   She notes she feels "ice cold" lately throughout her whole body.     Patient Stated Goals  Decreased shoulder pain, improved walking, balance,     Currently in Pain?  Yes    Pain Score  -- stiffness- R shoulder blade is stinging    Pain Location  Back    Pain Orientation  Right    Pain Onset  More than a month ago    Pain Frequency  Constant    Aggravating Factors   nerve pain/ stinging sensation    Pain Relieving Factors  unknown                       [facility] Adult PT  Treatment/Exercise - 04/15/18 1118      Ambulation/Gait   Ambulation/Gait  Yes    Ambulation/Gait Assistance  4: Min guard    Ambulation/Gait Assistance Details  spry step plus ground reaction AFO L LE    Ambulation Distance (Feet)  345 Feet    Assistive device  4-wheeled walker    Ambulation Surface  Level;Indoor    Gait velocity  1.5 ft/sec    Gait Comments  Utilized rollator RW with Left AFO x 345 feet, then rested and performed with forearm crutches x 230 ft.      Neuro Re-ed    Neuro Re-ed Details   Floor<>stand transfer with UE support mod indep on solid surface.  Performed quadriped alternating LE hip extension x 10 reps, 1/2 knee with UE support with assist for L foot clearance.  Tall kneeling working on dec'ing UE support transitioning heel sit>tall kneeling.                PT Short Term Goals - 04/02/18 1157      PT SHORT TERM GOAL #1  Title  Pt to be independent with initial HEP for R shoulder     Baseline  No further shoulder pain since MD injection completed.     Time  4    Period  Weeks    Status  Deferred      PT SHORT TERM GOAL #2   Title  The patient will trial AFO L LE to determine options to improve foot clearance for improved gait efficiency and safety.    Baseline  PT to request order.  We have trialed posterior carbon brace and anterior loading (performed better with posterior brace, however still had mild recurvatum).    Time  4    Period  Weeks    Status  Achieved      PT SHORT TERM GOAL #3   Title  The patient will return demo HEP for LE stretching to help with management of chronic hypertonicity/spasticity.    Baseline  Notes HEP going well.    Time  4    Period  Weeks    Status  Achieved      PT SHORT TERM GOAL #4   Title  The patient will improve gait speed from 0.55 ft/sec to > or equal to 0.8 ft/sec to demo improving efficiency with gait for household mobility.    Baseline  Met on 04/02/17 with 1.38 ft/sec using rollator and L AFO  (posterior ottobach).      Time  4    Period  Weeks    Status  Achieved        PT Long Term Goals - 02/26/18 1507      PT LONG TERM GOAL #1   Title  The patient will verbalize understanding of progression of HEP or community wellness options for post d/c from therapy.    Time  8    Period  Weeks    Target Date  04/27/18      PT LONG TERM GOAL #2   Title  The patient will improve gait speed from 0.55 ft/sec to 1.0 ft/sec to demo improving gait efficiency.    Time  8    Period  Weeks    Target Date  04/27/18      PT LONG TERM GOAL #3   Title  The patient will be further assessed on Berg balance scale and LTG to follow.    Time  8    Period  Weeks    Target Date  04/27/18      PT LONG TERM GOAL #4   Title  The patient will move floor<>stand with UE support mod indep due to h/o falls.    Time  8    Period  Weeks    Target Date  04/27/18      PT LONG TERM GOAL #5   Title  The patient will be further assessed on stairs and LTG to follow.    Time  8    Period  Weeks    Target Date  04/27/18            Plan - 04/15/18 1515    Clinical Impression Statement  The patient has improved L foot clearance today with ground reaction AFO.  *Plan to demo this with orthotist next week to determine best long term brace.  Patient's gait speed significantly improved with use of L AFO.  She is showing progress with use of forearm crutches.     PT Treatment/Interventions  ADLs/Self Care Home Management;Cryotherapy;Electrical Stimulation;Moist Heat;Therapeutic exercise;Therapeutic activities;Functional  mobility training;Stair training;Gait training;DME Instruction;Ultrasound;Balance training;Neuromuscular re-education;Patient/family education;Orthotic Fit/Training;Prosthetic Training;Wheelchair mobility training;Manual techniques;Taping;Energy conservation;Passive range of motion    PT Next Visit Plan  check new Hep as needed, try other off the shelf AFOs with rollator and then progress to  loftstrands.  Trunk stretching, isolated L motor control, standing balance.  BIONESS.  BERG.    Consulted and Agree with Plan of Care  Patient       Patient will benefit from skilled therapeutic intervention in order to improve the following deficits and impairments:  Abnormal gait, Decreased endurance, Hypomobility, Impaired sensation, Decreased knowledge of precautions, Decreased activity tolerance, Decreased knowledge of use of DME, Decreased strength, Impaired UE functional use, Pain, Difficulty walking, Decreased mobility, Decreased balance, Decreased range of motion, Impaired perceived functional ability, Improper body mechanics, Impaired flexibility, Decreased safety awareness, Decreased coordination, Increased muscle spasms  Visit Diagnosis: Other abnormalities of gait and mobility  Muscle weakness (generalized)  Muscle spasticity     Problem List Patient Active Problem List   Diagnosis Date Noted  . Difficulty walking 01/28/2018  . Cigarette smoker 12/18/2017  . Hypertension 11/27/2017  . Chronic pain 11/27/2017  . Depression, major, single episode, in partial remission ([place])     [place], PT 04/15/2018, 3:16 PM  [facility] [address] [place], [name], [address] Phone: [phone]   Fax:  [phone]  Name: Mylene Bow MRN: [id number] Date of Birth: January 02, 1956

## 2018-04-16 ENCOUNTER — Ambulatory Visit: Payer: Medicare Other | Admitting: Rehabilitative and Restorative Service Providers"

## 2018-04-16 NOTE — Telephone Encounter (Signed)
Please advise if okay to refill. 

## 2018-04-22 ENCOUNTER — Encounter: Payer: Self-pay | Admitting: Rehabilitative and Restorative Service Providers"

## 2018-04-22 ENCOUNTER — Ambulatory Visit: Payer: Medicare Other | Admitting: Rehabilitative and Restorative Service Providers"

## 2018-04-22 DIAGNOSIS — M62838 Other muscle spasm: Secondary | ICD-10-CM | POA: Diagnosis not present

## 2018-04-22 DIAGNOSIS — M6281 Muscle weakness (generalized): Secondary | ICD-10-CM

## 2018-04-22 DIAGNOSIS — R2689 Other abnormalities of gait and mobility: Secondary | ICD-10-CM | POA: Diagnosis not present

## 2018-04-22 NOTE — Therapy (Signed)
[facility] [address] [place], [name], [address] Phone: [phone]   Fax:  [phone]  Physical Therapy Treatment  Patient Details  Name: Brianna Saunders MRN: [id number] Date of Birth: 02-17-1956 Referring Provider: Teresa Coombs, DO   Encounter Date: 04/22/2018  PT End of Session - 04/22/18 1445    Visit Number  9    Number of Visits  17    Date for PT Re-Evaluation  04/27/18    Authorization Type  Medicare    PT Start Time  1105    PT Stop Time  1150    PT Time Calculation (min)  45 min    Equipment Utilized During Treatment  Gait belt    Activity Tolerance  Patient tolerated treatment well    Behavior During Therapy  [facility] for tasks assessed/performed       Past Medical History:  Diagnosis Date  . Chicken pox   . Hypertension     Past Surgical History:  Procedure Laterality Date  . FRACTURE SURGERY    . PARTIAL HYSTERECTOMY    . SPINE SURGERY     lumbar spine- around 2014  . upper back surgery     states plate in neck and into right shoulder blade reportedly- around 2014    There were no vitals filed for this visit.  Subjective Assessment - 04/22/18 1139    Subjective  The patient notes no further falls.  She notes her upper back has been hurting lately.     Limitations  Standing;Walking;House hold activities    Patient Stated Goals  Decreased shoulder pain, improved walking, balance,     Currently in Pain?  Yes    Pain Score  -- nerve pain, hypersensitive    Pain Location  Back    Pain Orientation  Right;Upper    Pain Descriptors / Indicators  Aching;Burning    Pain Onset  More than a month ago    Aggravating Factors   nerve pain    Pain Relieving Factors  unknown         [facility] PT Assessment - 04/22/18 1141      Ambulation/Gait   Ambulation/Gait Assistance Details  spry step plus (ground reaction AFO) *had Chris from [facility] attend session in order to observe patient  mechanics/gait and determine if this orthotic is best fit.  We discussed shoewear and use of stability shoe in order to improve foot positioning to reduce overpronation.      Ambulation Distance (Feet)  345 Feet    Gait Pattern  Poor foot clearance - left;Poor foot clearance - right;Ataxic    Ambulation Surface  Level    Gait Comments  With bilateral loftstrands, the patient requires CGA to close supervision.  Attempted right loftstrand with CGA to min A (with patient having L HHA due to comfort intermittently).                    [facility] Adult PT Treatment/Exercise - 04/22/18 1141      Ambulation/Gait   Ambulation/Gait  Yes    Ambulation/Gait Assistance  4: Min guard    Assistive device  4-wheeled walker;Lofstrands      Self-Care   Self-Care  Other Self-Care Comments    Other Self-Care Comments   Discussed options for bracing, shoewear, and wearing schedule for AFO (due to dec'd sensation).                PT Short Term Goals - 04/02/18 1157  PT SHORT TERM GOAL #1   Title  Pt to be independent with initial HEP for R shoulder     Baseline  No further shoulder pain since MD injection completed.     Time  4    Period  Weeks    Status  Deferred      PT SHORT TERM GOAL #2   Title  The patient will trial AFO L LE to determine options to improve foot clearance for improved gait efficiency and safety.    Baseline  PT to request order.  We have trialed posterior carbon brace and anterior loading (performed better with posterior brace, however still had mild recurvatum).    Time  4    Period  Weeks    Status  Achieved      PT SHORT TERM GOAL #3   Title  The patient will return demo HEP for LE stretching to help with management of chronic hypertonicity/spasticity.    Baseline  Notes HEP going well.    Time  4    Period  Weeks    Status  Achieved      PT SHORT TERM GOAL #4   Title  The patient will improve gait speed from 0.55 ft/sec to > or equal to 0.8 ft/sec  to demo improving efficiency with gait for household mobility.    Baseline  Met on 04/02/17 with 1.38 ft/sec using rollator and L AFO (posterior ottobach).      Time  4    Period  Weeks    Status  Achieved        PT Long Term Goals - 02/26/18 1507      PT LONG TERM GOAL #1   Title  The patient will verbalize understanding of progression of HEP or community wellness options for post d/c from therapy.    Time  8    Period  Weeks    Target Date  04/27/18      PT LONG TERM GOAL #2   Title  The patient will improve gait speed from 0.55 ft/sec to 1.0 ft/sec to demo improving gait efficiency.    Time  8    Period  Weeks    Target Date  04/27/18      PT LONG TERM GOAL #3   Title  The patient will be further assessed on Berg balance scale and LTG to follow.    Time  8    Period  Weeks    Target Date  04/27/18      PT LONG TERM GOAL #4   Title  The patient will move floor<>stand with UE support mod indep due to h/o falls.    Time  8    Period  Weeks    Target Date  04/27/18      PT LONG TERM GOAL #5   Title  The patient will be further assessed on stairs and LTG to follow.    Time  8    Period  Weeks    Target Date  04/27/18            Plan - 04/22/18 1451    Clinical Impression Statement  The patient has slower gait speed with forearm crutches (0.85 ft/sec today) than with rollater RW (assessed last week).  Discussed potential to shift to lofstrands in the home and continue to use rollator in the community.  This will provide safety on outdoor surfaces while letting patient get used to loftstrands for household distances.  PT Treatment/Interventions  ADLs/Self Care Home Management;Cryotherapy;Electrical Stimulation;Moist Heat;Therapeutic exercise;Therapeutic activities;Functional mobility training;Stair training;Gait training;DME Instruction;Ultrasound;Balance training;Neuromuscular re-education;Patient/family education;Orthotic Fit/Training;Prosthetic Training;Wheelchair  mobility training;Manual techniques;Taping;Energy conservation;Passive range of motion    PT Next Visit Plan  ground reaction AFO, rollator for community distances, loftstrands for household distances.  Bioness as needed.      Consulted and Agree with Plan of Care  Patient       Patient will benefit from skilled therapeutic intervention in order to improve the following deficits and impairments:  Abnormal gait, Decreased endurance, Hypomobility, Impaired sensation, Decreased knowledge of precautions, Decreased activity tolerance, Decreased knowledge of use of DME, Decreased strength, Impaired UE functional use, Pain, Difficulty walking, Decreased mobility, Decreased balance, Decreased range of motion, Impaired perceived functional ability, Improper body mechanics, Impaired flexibility, Decreased safety awareness, Decreased coordination, Increased muscle spasms  Visit Diagnosis: Other abnormalities of gait and mobility  Muscle weakness (generalized)  Muscle spasticity     Problem List Patient Active Problem List   Diagnosis Date Noted  . Difficulty walking 01/28/2018  . Cigarette smoker 12/18/2017  . Hypertension 11/27/2017  . Chronic pain 11/27/2017  . Depression, major, single episode, in partial remission ([place])     Ben [place], PT 04/22/2018, 2:54 PM  [facility] [address] [place], [name], [address] Phone: [phone]   Fax:  [phone]  Name: Brianna Saunders MRN: [id number] Date of Birth: 07-Jan-1956

## 2018-04-23 ENCOUNTER — Ambulatory Visit: Payer: Medicare Other | Admitting: Rehabilitative and Restorative Service Providers"

## 2018-04-29 ENCOUNTER — Encounter: Payer: Self-pay | Admitting: Rehabilitative and Restorative Service Providers"

## 2018-04-29 ENCOUNTER — Ambulatory Visit: Payer: Medicare Other | Admitting: Rehabilitative and Restorative Service Providers"

## 2018-04-29 DIAGNOSIS — M6281 Muscle weakness (generalized): Secondary | ICD-10-CM

## 2018-04-29 DIAGNOSIS — M62838 Other muscle spasm: Secondary | ICD-10-CM | POA: Diagnosis not present

## 2018-04-29 DIAGNOSIS — R2689 Other abnormalities of gait and mobility: Secondary | ICD-10-CM

## 2018-04-30 ENCOUNTER — Ambulatory Visit: Payer: Medicare Other | Admitting: Rehabilitative and Restorative Service Providers"

## 2018-04-30 ENCOUNTER — Telehealth: Payer: Self-pay | Admitting: Family Medicine

## 2018-04-30 NOTE — Telephone Encounter (Signed)
See note.   Copied from [place] [id number]. Topic: General - Other >> Apr 30, 2018  9:14 AM Yvette Rack wrote: Reason for CRM: Sharyn Lull from [facility] [phone] calling stating that they sent a fax over yesterday and wanted to know if the office has received

## 2018-04-30 NOTE — Addendum Note (Signed)
Addended by: Rudell Cobb M on: 04/30/2018 12:30 PM   Modules accepted: Orders

## 2018-04-30 NOTE — Therapy (Addendum)
[facility] [address] [place], [name], [address] Phone: [phone]   Fax:  [phone]  Physical Therapy Treatment and Progress Note  Patient Details  Name: Brianna Saunders MRN: [id number] Date of Birth: 1956/04/08 Referring Provider: Teresa Coombs, DO   Encounter Date: 04/29/2018  PT End of Session - 04/29/18 1117    Visit Number  10    Number of Visits  17    Date for PT Re-Evaluation  04/27/18    Authorization Type  Medicare    PT Start Time  1113    PT Stop Time  1152    PT Time Calculation (min)  39 min    Equipment Utilized During Treatment  Gait belt    Activity Tolerance  Patient tolerated treatment well    Behavior During Therapy  [facility] for tasks assessed/performed       Past Medical History:  Diagnosis Date  . Chicken pox   . Hypertension     Past Surgical History:  Procedure Laterality Date  . FRACTURE SURGERY    . PARTIAL HYSTERECTOMY    . SPINE SURGERY     lumbar spine- around 2014  . upper back surgery     states plate in neck and into right shoulder blade reportedly- around 2014    There were no vitals filed for this visit.  Subjective Assessment - 04/29/18 1114    Subjective  The patient notes that she had a fall last week when turning and fell backwards (no injury).   Patient notes symptoms under her arms are about the same.    Patient Stated Goals  Decreased shoulder pain, improved walking, balance,     Currently in Pain?  Yes    Pain Location  Back    Pain Orientation  Right;Mid    Pain Descriptors / Indicators  Aching;Tightness    Pain Type  Chronic pain    Pain Onset  More than a month ago    Pain Frequency  Constant    Aggravating Factors   nerve pain    Pain Relieving Factors  unknown         [facility] PT Assessment - 04/29/18 1138      Ambulation/Gait   Ambulation Surface  Level;Indoor;Outdoor    Gait velocity  1.38 ft/sec                   [facility]  Adult PT Treatment/Exercise - 04/29/18 1138      Ambulation/Gait   Ambulation/Gait  Yes    Ambulation/Gait Assistance  5: Supervision;4: Min guard    Ambulation/Gait Assistance Details  with L AFO (spry step ground reaction AFO)    Ambulation Distance (Feet)  500 Feet    Assistive device  4-wheeled walker    Stairs  Yes      Neuro Re-ed    Neuro Re-ed Details   Standing backwards walking with CGA to min A emphasizing motor learning L LE for hamstring engagement.      Exercises   Exercises  Other Exercises    Other Exercises   Sitting:  Tennis ball roll left foot, toe flexion/extension.  Chin tucks x 5 second holds x 5 reps with demonstration and verbal cues.  Sitting on elevated stool moving L LE forward /backward to engage left hamstring, hip flexors for motor control for pre gait activities.               PT Short Term Goals - 04/02/18 1157  PT SHORT TERM GOAL #1   Title  Pt to be independent with initial HEP for R shoulder     Baseline  No further shoulder pain since MD injection completed.     Time  4    Period  Weeks    Status  Deferred      PT SHORT TERM GOAL #2   Title  The patient will trial AFO L LE to determine options to improve foot clearance for improved gait efficiency and safety.    Baseline  PT to request order.  We have trialed posterior carbon brace and anterior loading (performed better with posterior brace, however still had mild recurvatum).    Time  4    Period  Weeks    Status  Achieved      PT SHORT TERM GOAL #3   Title  The patient will return demo HEP for LE stretching to help with management of chronic hypertonicity/spasticity.    Baseline  Notes HEP going well.    Time  4    Period  Weeks    Status  Achieved      PT SHORT TERM GOAL #4   Title  The patient will improve gait speed from 0.55 ft/sec to > or equal to 0.8 ft/sec to demo improving efficiency with gait for household mobility.    Baseline  Met on 04/02/17 with 1.38 ft/sec  using rollator and L AFO (posterior ottobach).      Time  4    Period  Weeks    Status  Achieved        PT Long Term Goals - 04/29/18 1137      PT LONG TERM GOAL #1   Title  The patient will verbalize understanding of progression of HEP or community wellness options for post d/c from therapy.    Time  8    Period  Weeks    Status  On-going      PT LONG TERM GOAL #2   Title  The patient will improve gait speed from 0.55 ft/sec to 1.0 ft/sec to demo improving gait efficiency.    Baseline  1.38 ft/sec on 04/29/18    Time  8    Period  Weeks    Status  Achieved      PT LONG TERM GOAL #3   Title  The patient will be further assessed on Berg balance scale and LTG to follow.    Baseline  21/56 on 04/08/2018    Time  8    Period  Weeks    Status  Partially Met      PT LONG TERM GOAL #4   Title  The patient will move floor<>stand with UE support mod indep due to h/o falls.    Baseline  04/29/18: met per subjective report of using rollator RW (in locked position) to press up with UEs.    Time  8    Period  Weeks    Status  Achieved      PT LONG TERM GOAL #5   Title  The patient will be further assessed on stairs and LTG to follow.    Time  8    Period  Weeks    Status  On-going       UPDATED SHORT AND LONG TERM GOALS:   PT Short Term Goals - 04/30/18 1219      PT SHORT TERM GOAL #1   Title  The patient will be further assessed on stairs  and be mod indep for stair negotaition with one rail.    Time  4    Period  Weeks    Status  New    Target Date  05/29/18      PT SHORT TERM GOAL #2   Title  The patient will obtain AFO and verbalize understanding of wearing schedule    Time  4    Period  Weeks    Status  New    Target Date  05/29/18      PT SHORT TERM GOAL #3   Title  The patient will improve Berg from 21/56 to > or equal to 26/56.    Time  4    Period  Weeks    Status  New    Target Date  05/29/18      PT SHORT TERM GOAL #4   Title  The patient will improve  gait speed from 1.38 ft/sec to > or equal to 1.5 ft/sec to demo improving efficiency with gait for household mobility.    Time  4    Period  Weeks    Status  New    Target Date  05/29/18      PT Long Term Goals - 04/30/18 1217      PT LONG TERM GOAL #1   Title  The patient will verbalize understanding of progression of HEP or community wellness options for post d/c from therapy.    Time  8    Period  Weeks    Status  Revised    Target Date  06/28/18      PT LONG TERM GOAL #2   Title  The patient will improve gait speed from 1.38 ft/sec to > or equal to 1.8 ft/sec to reduce fall risk.    Time  8    Period  Weeks    Target Date  06/28/18      PT LONG TERM GOAL #3   Title  The patient will improve Berg from 21/56 to > or equal to 30/56 to demonstrate improving steady state standing balance.    Time  8    Period  Weeks    Status  Revised    Target Date  06/28/18      PT LONG TERM GOAL #4   Title  The patient will ambulate mod indep with assistive device x 1000 ft including curbs, paved surfaces, inclines.    Time  8    Period  Weeks    Status  New    Target Date  06/28/18           Plan - 04/30/18 0836    Clinical Impression Statement  The patient met 2 LTGs.  She has been seen 10 visits out of 17 initially requested due to transportation issues and has dec'd frequency to 1x/week.  PT to continue working towards modified STGs/LTGs in order to optimize gait efficiency, educate on use of AFO (waiting to be delivered to patient), progress to gym routine at apartment complex, and discuss reintegration into community activities (patient avoiding travel to visit family due to mobility concerns).      PT Frequency  1x / week    PT Duration  8 weeks    PT Treatment/Interventions  ADLs/Self Care Home Management;Cryotherapy;Electrical Stimulation;Moist Heat;Therapeutic exercise;Therapeutic activities;Functional mobility training;Stair training;Gait training;DME  Instruction;Ultrasound;Balance training;Neuromuscular re-education;Patient/family education;Orthotic Fit/Training;Prosthetic Training;Wheelchair mobility training;Manual techniques;Taping;Energy conservation;Passive range of motion    PT Next Visit Plan  ground reaction AFO, rollator for community  distances, loftstrands for household distances.  Bioness as needed.   Stair assessment, discuss gym routine and home program.    Consulted and Agree with Plan of Care  Patient       Patient will benefit from skilled therapeutic intervention in order to improve the following deficits and impairments:  Abnormal gait, Decreased endurance, Hypomobility, Impaired sensation, Decreased knowledge of precautions, Decreased activity tolerance, Decreased knowledge of use of DME, Decreased strength, Impaired UE functional use, Pain, Difficulty walking, Decreased mobility, Decreased balance, Decreased range of motion, Impaired perceived functional ability, Improper body mechanics, Impaired flexibility, Decreased safety awareness, Decreased coordination, Increased muscle spasms  Visit Diagnosis: Other abnormalities of gait and mobility  Muscle weakness (generalized)  Muscle spasticity  Physical Therapy Progress Note   Dates of Reporting Period:   02/26/2018 to 04/29/2018   Objective Reports of Subjective Statement:   Subjective reports of improved gait speed and mobiity in home.     Objective Measurements: see goals above.  Goal Update: see above   Plan: see plan above.  PT to continue working on increasing mobility for community reintegration.    Reason Skilled Services are Required: continued progression with gait, balance and strengthening activities   Thank you for the referral of this patient. Rudell Cobb, MPT    Dann Ventress 04/30/2018, 12:09 PM  [facility] [address] [place], [name], [address] Phone: [phone]   Fax:   [phone]  Name: Brianna Saunders MRN: [id number] Date of Birth: 1956/04/01

## 2018-05-01 NOTE — Telephone Encounter (Signed)
Called [facility] and let her know that we had not received any paperwork. I asked her to refax but she stated they had already received back the paperwork they had sent

## 2018-05-06 ENCOUNTER — Ambulatory Visit: Payer: Medicare Other | Admitting: Rehabilitative and Restorative Service Providers"

## 2018-05-07 ENCOUNTER — Ambulatory Visit (INDEPENDENT_AMBULATORY_CARE_PROVIDER_SITE_OTHER): Payer: Medicare Other | Admitting: Sports Medicine

## 2018-05-07 ENCOUNTER — Ambulatory Visit: Payer: Medicare Other | Admitting: Rehabilitative and Restorative Service Providers"

## 2018-05-07 ENCOUNTER — Ambulatory Visit: Payer: Self-pay

## 2018-05-07 ENCOUNTER — Encounter: Payer: Self-pay | Admitting: Sports Medicine

## 2018-05-07 VITALS — BP 136/84 | HR 94 | Ht 64.5 in | Wt 164.2 lb

## 2018-05-07 DIAGNOSIS — M6281 Muscle weakness (generalized): Secondary | ICD-10-CM

## 2018-05-07 DIAGNOSIS — R269 Unspecified abnormalities of gait and mobility: Secondary | ICD-10-CM

## 2018-05-07 DIAGNOSIS — G8929 Other chronic pain: Secondary | ICD-10-CM | POA: Diagnosis not present

## 2018-05-07 DIAGNOSIS — G8928 Other chronic postprocedural pain: Secondary | ICD-10-CM

## 2018-05-07 DIAGNOSIS — M25511 Pain in right shoulder: Secondary | ICD-10-CM

## 2018-05-07 NOTE — Patient Instructions (Signed)

## 2018-05-07 NOTE — Progress Notes (Signed)
[name]. [place], [place] at [facility] [phone]  [name] - 62 y.o. female MRN [id number]  Date of birth: 23-Sep-1956  Visit Date: 05/07/2018  PCP: Marin Olp, MD   Referred by: Marin Olp, MD  Scribe(s) for today's visit: Wendy Poet, LAT, ATC  SUBJECTIVE:  Brianna Saunders is here for Follow-up (R shoulder pain) .    01/28/18: Her R shoulder pain symptoms INITIALLY: Began 01/21/18 and pt reports no known trauma or injury. She does have dx of chronic pain. Described as moderate (6/10) aching, radiating to the R bicep Worsened with activity.  Improved with rest and medications given by Inda Coke last week.  Additional associated symptoms include: She reports decreased ROM, denies stiffness, numbness, tingling.     At this time symptoms are worsening compared to onset, pain is constant.  She takes Baclofen TID and Gabapentin 300 mg TID for chronic pain. She has tried using heat on the shoulder with no relief. She has tried taking Acetaminophen and NSAID's with no relief. She was seen by Inda Coke 01/23/18 and prescribed Prednisone 10 mg to take TID for 5 days that provided some relief.   No recent imaging of the R shoulder  05/07/2018: Compared to the last office visit on 01/28/18, her previously described R shoulder pain symptoms had been improving until Saturday when her symptoms flared up.  She is having the most pain w/ attempting to aBd her R shoulder. Current symptoms are moderate & are radiating to R lateral upper arm. She has completed 10 PT visits.  She's taking Baclofen and Gabapentin 300 mg TID.  She had a depomedrol injection into her R shoulder at her last visit which she feels helped her symptoms.    Had a R shoulder XR on 01/28/18.   REVIEW OF SYSTEMS: Denies night time disturbances. Denies fevers, chills, or night sweats. Denies unexplained weight loss. Denies personal  history of cancer. Denies changes in bowel or bladder habits. Denies recent unreported falls. Reports new or worsening dyspnea or wheezing. Denies headaches or dizziness.  Reports numbness, tingling or weakness  In the extremities - in B feet Denies dizziness or presyncopal episodes Denies lower extremity edema    HISTORY & PERTINENT PRIOR DATA:  Prior History reviewed and updated per electronic medical record.  Significant/pertinent history, findings, studies include:  reports that she has been smoking cigarettes.  She started smoking about 48 years ago. She has a 22.50 pack-year smoking history. She has never used smokeless tobacco. No results for input(s): HGBA1C, LABURIC, CREATINE in the last 8760 hours. No specialty comments available. No problems updated.  OBJECTIVE:  VS:  HT:5' 4.5" (163.8 cm)   WT:164 lb 3.2 oz (74.5 kg)  BMI:27.76    BP:136/84  HR:94bpm  TEMP: ( )  RESP:96 %   PHYSICAL EXAM: Constitutional: WDWN, Non-toxic appearing. Psychiatric: Alert & appropriately interactive.  Not depressed or anxious appearing. Respiratory: No increased work of breathing.  Trachea Midline Eyes: Pupils are equal.  EOM intact without nystagmus.  No scleral icterus  Vascular Exam: warm to touch no edema  upper extremity neuro exam: unremarkable  MSK Exam: Right shoulder overall well aligned.  She has limited overhead range of motion by 10 to 15 degrees on the right compared to the left.  She has good internal and external rotation strength.  No significant pain with axial load and circumduction.  Positive Hawkins and Neer's. Antalgic gait with foot  drop on the left and walker dependent    ASSESSMENT & PLAN:   1. Chronic right shoulder pain   2. Gait disturbance   3. Muscle weakness (generalized)   4. Other chronic postprocedural pain     PLAN: Ultrasound-guided injection performed today.  She is showing a moderate degree of improvement with her symptoms but given the  underlying bursa she did well with injection therapy today.  We will have her continue with physical therapy and overall she is happy with her progress.  Her underlying gait disturbance/neurologic issue is causing her to use her shoulder more than she otherwise would be feels happy with once again how she is progressing.  She does have an AFO coming for her abnormal gait.  Follow-up: Return in about 1 month (around 06/04/2018).      Please see additional documentation for Objective, Assessment and Plan sections. Pertinent additional documentation may be included in corresponding procedure notes, imaging studies, problem based documentation and patient instructions. Please see these sections of the encounter for additional information regarding this visit.  CMA/ATC served as Education administrator during this visit. History, Physical, and Plan performed by medical provider. Documentation and orders reviewed and attested to.      Gerda Diss, [facility] Physician

## 2018-05-07 NOTE — Procedures (Signed)
PROCEDURE NOTE:  Ultrasound Guided: Injection: Right rotator cuff intverval/Subacromial Bursae Images were obtained and interpreted by myself, Teresa Coombs, DO  Images have been saved and stored to PACS system. Images obtained on: GE S7 Ultrasound machine    ULTRASOUND FINDINGS:  Rotator cuff interval Tendinopathic changes with small subacromial bursa.  Interstitial splitting of the supraspinatus but without overt full-thickness tearing.  DESCRIPTION OF PROCEDURE:  The patient's clinical condition is marked by substantial pain and/or significant functional disability. Other conservative therapy has not provided relief, is contraindicated, or not appropriate. There is a reasonable likelihood that injection will significantly improve the patient's pain and/or functional impairment.   After discussing the risks, benefits and expected outcomes of the injection and all questions were reviewed and answered, the patient wished to undergo the above named procedure.  Verbal consent was obtained.  The ultrasound was used to identify the target structure and adjacent neurovascular structures. The skin was then prepped in sterile fashion and the target structure was injected under direct visualization using sterile technique as below:  Single injection performed as below: PREP: Alcohol and Ethel Chloride APPROACH:direct, single injection, 21g 2 in. INJECTATE: 2 cc 0.5% Marcaine and 1 cc 80mg /mL DepoMedrol ASPIRATE: None DRESSING: Band-Aid  Post procedural instructions including recommending icing and warning signs for infection were reviewed.    This procedure was well tolerated and there were no complications.   IMPRESSION: Succesful Ultrasound Guided: Injection

## 2018-05-11 ENCOUNTER — Encounter: Payer: Self-pay | Admitting: Sports Medicine

## 2018-05-13 ENCOUNTER — Telehealth: Payer: Self-pay

## 2018-05-13 NOTE — Telephone Encounter (Signed)
Called patient to assess status of Cologuard. Unable to leave a voicemail message as mailbox had not been set up yet.

## 2018-05-13 NOTE — Telephone Encounter (Signed)
-----   Message from Marin Olp, MD sent at 05/06/2018  8:16 AM EDT -----  Lets call to check on this- after prior not receiving positive result from cologuard I needing help following these up more frequently.   Brianna Saunders  ----- Message ----- From: SYSTEM Sent: 12/23/2017  12:06 AM To: Marin Olp, MD

## 2018-05-20 ENCOUNTER — Ambulatory Visit: Payer: Medicare Other | Attending: Sports Medicine | Admitting: Rehabilitative and Restorative Service Providers"

## 2018-05-20 ENCOUNTER — Telehealth: Payer: Self-pay | Admitting: Family Medicine

## 2018-05-20 ENCOUNTER — Encounter: Payer: Self-pay | Admitting: Rehabilitative and Restorative Service Providers"

## 2018-05-20 DIAGNOSIS — R2689 Other abnormalities of gait and mobility: Secondary | ICD-10-CM | POA: Diagnosis not present

## 2018-05-20 DIAGNOSIS — M62838 Other muscle spasm: Secondary | ICD-10-CM | POA: Diagnosis not present

## 2018-05-20 DIAGNOSIS — M6281 Muscle weakness (generalized): Secondary | ICD-10-CM | POA: Diagnosis not present

## 2018-05-20 MED ORDER — LISINOPRIL 40 MG PO TABS: 40.0000 mg | ORAL_TABLET | Freq: Every day | ORAL | 1 refills | Status: DC

## 2018-05-20 NOTE — Telephone Encounter (Signed)
Copied from [place] [id number]. Topic: Quick Communication - Rx Refill/Question >> May 20, 2018  9:27 AM Boyd Kerbs wrote: Medication:  lisinopril (PRINIVIL,ZESTRIL) 40 MG tablet Pt has one pill left for today [facility] yesterday [facility] day before   Has the patient contacted their pharmacy? Yes.   (Agent: If no, request that the patient contact the pharmacy for the refill.) (Agent: If yes, when and what did the pharmacy advise?)  Preferred Pharmacy (with phone number or street name):   [id number] [address], [name] [address] Phone: [phone] Fax: [phone]    Agent: Please be advised that RX refills may take up to 3 business days. We ask that you follow-up with your pharmacy.

## 2018-05-20 NOTE — Therapy (Addendum)
[facility] [address] [place], [name], [address] Phone: [phone]   Fax:  [phone]  Physical Therapy Treatment and Discharge Summary  Patient Details  Name: Brianna Saunders MRN: [id number] Date of Birth: [birth year] Referring Provider: Teresa Coombs, DO   Encounter Date: 05/20/2018  PT End of Session - 05/20/18 1249    Visit Number  11    Number of Visits  18    Date for PT Re-Evaluation  06/28/18    Authorization Type  medicare    PT Start Time  3491    PT Stop Time  1322    PT Time Calculation (min)  40 min    Equipment Utilized During Treatment  Gait belt    Activity Tolerance  Patient tolerated treatment well    Behavior During Therapy  [facility] for tasks assessed/performed       Past Medical History:  Diagnosis Date  . Chicken pox   . Hypertension     Past Surgical History:  Procedure Laterality Date  . FRACTURE SURGERY    . PARTIAL HYSTERECTOMY    . SPINE SURGERY     lumbar spine- around 2014  . upper back surgery     states plate in neck and into right shoulder blade reportedly- around 2014    There were no vitals filed for this visit.  Subjective Assessment - 05/20/18 1247    Subjective  The patient reports losses of balance, but no falls in the past 3 weeks.  She got left ground reaction AFO from [employer] yesterday and notes it is hard to walk in brace.      Patient Stated Goals  Decreased shoulder pain, improved walking, balance,     Currently in Pain?  No/denies improved after shot again                       [facility] Adult PT Treatment/Exercise - 05/20/18 2104      Ambulation/Gait   Ambulation/Gait  Yes    Ambulation/Gait Assistance  6: Modified independent (Device/Increase time)    Ambulation/Gait Assistance Details  with L AFO (PT adjusted brace to show patient it needs to fit at the posterior aspect of the shoe), the patient ambulated with improved left foot  clearance.  She noted improvement in perception of brace after adjustments made.    Ambulation Distance (Feet)  500 Feet    Assistive device  4-wheeled walker    Ambulation Surface  Level;Indoor    Stairs  Yes    Stairs Assistance  4: Min assist    Stairs Assistance Details (indicate cue type and reason)  Patient wants to visit her sister and notes stairs without handrails that she crawls up.  She also noted that family members grab her too tight on stairs and this makes her feel more off balance.  PT videod patient (on her ipad with her consent) and performed R SPC with L handheld assist with step to pattern to ascend and descent steps.  The patient could perform with min A (she has a cane at home to bring with her).  PT also discussed that she should not be walking with SPC as this would not be safe at this time.      Self-Care   Self-Care  Other Self-Care Comments    Other Self-Care Comments   Discussed skin checks with AFO with most emphasis on left lateral heel due to overpronation from h/o L hip ER, pronation (  used as a strategy to clear foot x years).  She verbalizes understanding.               PT Education - 05/20/18 2114    Education Details  stair negotiation, use of AFO, skin check    Person(s) Educated  Patient    Methods  Explanation    Comprehension  Verbalized understanding       PT Short Term Goals - 04/30/18 1219      PT SHORT TERM GOAL #1   Title  The patient will be further assessed on stairs and be mod indep for stair negotaition with one rail.    Time  4    Period  Weeks    Status  New    Target Date  05/29/18      PT SHORT TERM GOAL #2   Title  The patient will obtain AFO and verbalize understanding of wearing schedule    Time  4    Period  Weeks    Status  New    Target Date  05/29/18      PT SHORT TERM GOAL #3   Title  The patient will improve Berg from 21/56 to > or equal to 26/56.    Time  4    Period  Weeks    Status  New    Target Date   05/29/18      PT SHORT TERM GOAL #4   Title  The patient will improve gait speed from 1.38 ft/sec to > or equal to 1.5 ft/sec to demo improving efficiency with gait for household mobility.    Time  4    Period  Weeks    Status  New    Target Date  05/29/18        PT Long Term Goals - 04/30/18 1217      PT LONG TERM GOAL #1   Title  The patient will verbalize understanding of progression of HEP or community wellness options for post d/c from therapy.    Time  8    Period  Weeks    Status  Revised    Target Date  06/28/18      PT LONG TERM GOAL #2   Title  The patient will improve gait speed from 1.38 ft/sec to > or equal to 1.8 ft/sec to reduce fall risk.    Time  8    Period  Weeks    Target Date  06/28/18      PT LONG TERM GOAL #3   Title  The patient will improve Berg from 21/56 to > or equal to 30/56 to demonstrate improving steady state standing balance.    Time  8    Period  Weeks    Status  Revised    Target Date  06/28/18      PT LONG TERM GOAL #4   Title  The patient will ambulate mod indep with assistive device x 1000 ft including curbs, paved surfaces, inclines.    Time  8    Period  Weeks    Status  New    Target Date  06/28/18            Plan - 05/20/18 2115    Clinical Impression Statement  The patient obtained left AFO and needed cues for proper wear and mechanics to optimize use of brace.  PT emphasized stair negotiation without handrails (for upcoming travel with family), and gait mechanics during today's session.  PT Treatment/Interventions  ADLs/Self Care Home Management;Cryotherapy;Electrical Stimulation;Moist Heat;Therapeutic exercise;Therapeutic activities;Functional mobility training;Stair training;Gait training;DME Instruction;Ultrasound;Balance training;Neuromuscular re-education;Patient/family education;Orthotic Fit/Training;Prosthetic Training;Wheelchair mobility training;Manual techniques;Taping;Energy conservation;Passive range of motion     PT Next Visit Plan  provide HEP for R shoulder as pain returned (responded well to recent injection last week), discuss gym routine for post d/c and continue to work to STGs/LTGs.    Consulted and Agree with Plan of Care  Patient       Patient will benefit from skilled therapeutic intervention in order to improve the following deficits and impairments:  Abnormal gait, Decreased endurance, Hypomobility, Impaired sensation, Decreased knowledge of precautions, Decreased activity tolerance, Decreased knowledge of use of DME, Decreased strength, Impaired UE functional use, Pain, Difficulty walking, Decreased mobility, Decreased balance, Decreased range of motion, Impaired perceived functional ability, Improper body mechanics, Impaired flexibility, Decreased safety awareness, Decreased coordination, Increased muscle spasms  Visit Diagnosis: Other abnormalities of gait and mobility  Muscle weakness (generalized)  Muscle spasticity    PHYSICAL THERAPY DISCHARGE SUMMARY  Visits from Start of Care: 11  Current functional level related to goals / functional outcomes: See above   Remaining deficits: *Patient did not return after this visit for further physical therapy.  D/c due to no further visits.   Education / Equipment: Had been given HEP  Plan: Patient agrees to discharge.  Patient goals were not met. Patient is being discharged due to not returning since the last visit.  ?????         ADDENDED ON 12/31/2018 BY Arianie Couse, PT    Problem List Patient Active Problem List   Diagnosis Date Noted  . Difficulty walking 01/28/2018  . Cigarette smoker 12/18/2017  . Hypertension 11/27/2017  . Chronic pain 11/27/2017  . Depression, major, single episode, in partial remission ([place])     [employer], PT 05/20/2018, 9:18 PM  [facility] [address] [place], [name], [address] Phone: [phone]   Fax:   [phone]  Name: Brianna Saunders MRN: [id number] Date of Birth: November 13, 1955

## 2018-05-20 NOTE — Telephone Encounter (Signed)
Lisinopril 40 MG tab daily refill Last Refill:12/18/17 #90 tabs #1 refill Last OV: 12/18/17 with PCP PCP: Dr. Yong Channel Pharmacy:[facility] [address]

## 2018-05-22 ENCOUNTER — Telehealth: Payer: Self-pay | Admitting: Family Medicine

## 2018-05-22 NOTE — Telephone Encounter (Signed)
Copied from [place] [id number]. Topic: Quick Communication - Rx Refill/Question >> May 22, 2018  2:14 PM Neva Seat wrote: citalopram (CELEXA) 40 MG tablet  Pt needing medication filled.  [id number] [address], [name] [address] Phone: [phone] Fax: [phone]

## 2018-05-22 NOTE — Telephone Encounter (Signed)
Dr. Yong Channel pt requesting refill for Celexa 40 mg, never prescribed by you. Please advise.

## 2018-05-23 NOTE — Telephone Encounter (Signed)
You May fill. Please help set her up with an AWV with susan as well as a follow up in 2-3 months with me

## 2018-05-25 MED ORDER — CITALOPRAM HYDROBROMIDE 40 MG PO TABS: 40.0000 mg | ORAL_TABLET | Freq: Every day | ORAL | 0 refills | Status: DC

## 2018-05-25 NOTE — Addendum Note (Signed)
Addended by: Marian Sorrow on: 05/25/2018 08:53 AM   Modules accepted: Orders

## 2018-05-25 NOTE — Telephone Encounter (Signed)
Called patient to schedule appointment but she didn't answer and I couldn't leave a message. Her VM is not set up.

## 2018-05-25 NOTE — Telephone Encounter (Signed)
Patient scheduled for f/u with Dr. Yong Channel. Patient just had an AWV in February 2019 and would like to wait until next year to schedule the next one.

## 2018-05-25 NOTE — Telephone Encounter (Signed)
Spoke to pt told her Rx for Celexa was sent to pharmacy. Also Dr. Yong Channel would like to see you in 2-3 months and set up a Wellness visit with [name]. Told pt I will have someone from scheduling call you to schedule. Pt verbalized understanding.

## 2018-05-25 NOTE — Telephone Encounter (Signed)
Please call pt and schedule wellness visit with [name] and follow up with Dr. Yong Channel for 2-3 months. She is expecting a call to schedule.

## 2018-06-03 ENCOUNTER — Ambulatory Visit: Payer: Medicare Other | Admitting: Rehabilitative and Restorative Service Providers"

## 2018-06-08 ENCOUNTER — Ambulatory Visit: Payer: Medicare Other | Admitting: Sports Medicine

## 2018-06-08 DIAGNOSIS — Z0289 Encounter for other administrative examinations: Secondary | ICD-10-CM

## 2018-06-10 ENCOUNTER — Encounter: Payer: Medicare Other | Admitting: Rehabilitative and Restorative Service Providers"

## 2018-06-11 ENCOUNTER — Encounter: Payer: Self-pay | Admitting: Sports Medicine

## 2018-06-17 ENCOUNTER — Other Ambulatory Visit: Payer: Self-pay | Admitting: Family Medicine

## 2018-06-22 ENCOUNTER — Other Ambulatory Visit: Payer: Self-pay | Admitting: Family Medicine

## 2018-06-26 ENCOUNTER — Other Ambulatory Visit: Payer: Self-pay | Admitting: Family Medicine

## 2018-07-08 ENCOUNTER — Telehealth: Payer: Self-pay

## 2018-07-08 NOTE — Telephone Encounter (Signed)
Called patient's home # in chart (only # listed for patient) and received message that number cannot be completed as dialed.  Tried same # a second time and received same msg.    Letter mailed to patient

## 2018-07-08 NOTE — Telephone Encounter (Signed)
-----   Message from Marin Olp, MD sent at 07/08/2018  7:50 AM EDT ----- We need her cologuard to be completed for her colon cancer screening   Garret Reddish   ----- Message ----- From: SYSTEM Sent: 12/23/2017  12:06 AM EDT To: Marin Olp, MD

## 2018-07-08 NOTE — Progress Notes (Signed)
Letter mailed to patient in regard to completion of Cologuard

## 2018-07-09 ENCOUNTER — Other Ambulatory Visit: Payer: Self-pay | Admitting: Family Medicine

## 2018-07-09 MED ORDER — GABAPENTIN 300 MG PO CAPS: 900.0000 mg | ORAL_CAPSULE | Freq: Three times a day (TID) | ORAL | 1 refills | Status: DC

## 2018-07-09 NOTE — Telephone Encounter (Signed)
Copied from [place] [phone] [phone]. Topic: Quick Communication - Rx Refill/Question >> Jul 09, 2018  3:31 PM Waylan Rocher, [place] L wrote: Medication: gabapentin (NEURONTIN) 300 MG capsule   Has the patient contacted their pharmacy? Yes.   (Agent: If no, request that the patient contact the pharmacy for the refill.) (Agent: If yes, when and what did the pharmacy advise?)  Preferred Pharmacy (with phone number or street name): [id number] [place], [place] [address] [name] [address] Phone: [phone] Fax: [phone]  Agent: Please be advised that RX refills may take up to 3 business days. We ask that you follow-up with your pharmacy.

## 2018-07-09 NOTE — Telephone Encounter (Signed)
Patient called back.  She is staying with her daughter in [place] for a limited time.  This is the correct pharmacy that she would like the medication sent to.

## 2018-07-09 NOTE — Telephone Encounter (Signed)
[facility] [place] [facility] and spoke to Shirley, [facility] who says patient does not use this pharmacy, it may be the wrong pharmacy listed. Patient called, left VM to return call to the office to discuss refill. [facility] and spoke to Dazey, [facility] about gabapentin refill request. She says there are no refills left on the 30 day supply prescription from 02/11/18 and the patient is wanting a 90 day supply. I asked did she receive the prescription that is showing received on 06/17/18 810 cap/1 refill, she says that prescription was never received and asks if that can be sent in.  Gabapentin refill Last Refill:06/17/18 #810 (not received) Last OV: 12/18/17 PCP: [place]: [id number] [address] [place] [phone] (Phone) [phone] (Fax)

## 2018-07-09 NOTE — Telephone Encounter (Signed)
See note

## 2018-07-29 LAB — COLOGUARD: Cologuard: NEGATIVE

## 2018-08-13 ENCOUNTER — Ambulatory Visit: Payer: Medicare Other | Admitting: Family Medicine

## 2018-08-27 ENCOUNTER — Encounter: Payer: Self-pay | Admitting: Family Medicine

## 2018-08-27 ENCOUNTER — Ambulatory Visit (INDEPENDENT_AMBULATORY_CARE_PROVIDER_SITE_OTHER): Payer: Medicare Other | Admitting: Family Medicine

## 2018-08-27 VITALS — BP 138/88 | HR 99 | Temp 98.2°F | Resp 16 | Wt 170.0 lb

## 2018-08-27 DIAGNOSIS — G8928 Other chronic postprocedural pain: Secondary | ICD-10-CM | POA: Diagnosis not present

## 2018-08-27 DIAGNOSIS — F324 Major depressive disorder, single episode, in partial remission: Secondary | ICD-10-CM | POA: Diagnosis not present

## 2018-08-27 DIAGNOSIS — N644 Mastodynia: Secondary | ICD-10-CM | POA: Diagnosis not present

## 2018-08-27 DIAGNOSIS — Z114 Encounter for screening for human immunodeficiency virus [HIV]: Secondary | ICD-10-CM | POA: Diagnosis not present

## 2018-08-27 DIAGNOSIS — F17210 Nicotine dependence, cigarettes, uncomplicated: Secondary | ICD-10-CM

## 2018-08-27 DIAGNOSIS — I10 Essential (primary) hypertension: Secondary | ICD-10-CM | POA: Diagnosis not present

## 2018-08-27 DIAGNOSIS — M4802 Spinal stenosis, cervical region: Secondary | ICD-10-CM | POA: Diagnosis not present

## 2018-08-27 DIAGNOSIS — Z1159 Encounter for screening for other viral diseases: Secondary | ICD-10-CM

## 2018-08-27 DIAGNOSIS — R079 Chest pain, unspecified: Secondary | ICD-10-CM | POA: Diagnosis not present

## 2018-08-27 MED ORDER — LISINOPRIL 40 MG PO TABS: 40.0000 mg | ORAL_TABLET | Freq: Every day | ORAL | 1 refills | Status: DC

## 2018-08-27 MED ORDER — AMLODIPINE BESYLATE 10 MG PO TABS: 10.0000 mg | ORAL_TABLET | Freq: Every day | ORAL | 1 refills | Status: DC

## 2018-08-27 MED ORDER — METOPROLOL TARTRATE 50 MG PO TABS: 50.0000 mg | ORAL_TABLET | Freq: Two times a day (BID) | ORAL | 1 refills | Status: DC

## 2018-08-27 MED ORDER — BUPROPION HCL ER (XL) 150 MG PO TB24: 300.0000 mg | ORAL_TABLET | Freq: Every day | ORAL | 1 refills | Status: DC

## 2018-08-27 MED ORDER — GABAPENTIN 300 MG PO CAPS: 900.0000 mg | ORAL_CAPSULE | Freq: Three times a day (TID) | ORAL | 1 refills | Status: DC

## 2018-08-27 MED ORDER — BACLOFEN 10 MG PO TABS: ORAL_TABLET | ORAL | 5 refills | Status: DC

## 2018-08-27 MED ORDER — CITALOPRAM HYDROBROMIDE 40 MG PO TABS: 40.0000 mg | ORAL_TABLET | Freq: Every day | ORAL | 1 refills | Status: DC

## 2018-08-27 NOTE — Assessment & Plan Note (Signed)
S: compliant with celexa 40mg  (out for 2-3 days and symptoms have worsened) and wellbutrin 150mg  XR. Last visit phq9 was at 8 but she was taking 3 wellbutrin and we asked her to cut down to 2. She would like out to get out more but given her dependence on walker, incontinence issues- it just simply isnt as fun as she would like for it to be- spends more time at home as a result A/P: a lot of these depressed symptoms are situational. Wonder if we could see further improvement with counseling- gave handout today. Also told her dont want her running out of celexa- refilled these today

## 2018-08-27 NOTE — Assessment & Plan Note (Signed)
See spinal stenosis section

## 2018-08-27 NOTE — Assessment & Plan Note (Signed)
S: controlled on repeat on- amlodipine 10 mg, lisinopril 40 mg, metoprol 50 mg twice daily. BP Readings from Last 3 Encounters:  08/27/18 138/88  05/07/18 136/84  02/08/18 (!) 156/89  A/P: We discussed blood pressure goal of <140/90. Continue current meds:  Missed some of her meds today- encouraged  aher miss very infrequently

## 2018-08-27 NOTE — Progress Notes (Signed)
Subjective:  Brianna Saunders is a 62 y.o. year old very pleasant female patient who presents for/with See problem oriented charting ROS- continued back pain but improved overall. Some down mood. No increased edema. Still depends on walker due to poor gait   Past Medical History-  Patient Active Problem List   Diagnosis Date Noted  . Hypertension 11/27/2017    Priority: Medium  . Depression, major, single episode, in partial remission ([place])     Priority: Medium  . Cervical spinal stenosis 08/27/2018  . Difficulty walking 01/28/2018  . Cigarette smoker 12/18/2017  . Chronic pain 11/27/2017    Medications- reviewed and updated Current Outpatient Medications  Medication Sig Dispense Refill  . amLODipine (NORVASC) 10 MG tablet Take 1 tablet (10 mg total) by mouth daily. 90 tablet 1  . baclofen (LIORESAL) 10 MG tablet TAKE 1 TABLET BY MOUTH THREE TIMES A DAY AS NEEDED FOR MUSCLE SPASMS 90 tablet 5  . buPROPion (WELLBUTRIN XL) 150 MG 24 hr tablet Take 2 tablets (300 mg total) by mouth daily. 180 tablet 1  . Cholecalciferol (VITAMIN D3) 50000 units TABS Take 1 tablet by mouth once a week.    . citalopram (CELEXA) 40 MG tablet Take 1 tablet (40 mg total) by mouth daily. 90 tablet 1  . gabapentin (NEURONTIN) 300 MG capsule Take 3 capsules (900 mg total) by mouth 3 (three) times daily. 810 capsule 1  . lisinopril (PRINIVIL,ZESTRIL) 40 MG tablet Take 1 tablet (40 mg total) by mouth daily. 90 tablet 1  . metoprolol tartrate (LOPRESSOR) 50 MG tablet Take 1 tablet (50 mg total) by mouth 2 (two) times daily. 180 tablet 1   No current facility-administered medications for this visit.     Objective: BP 138/88   Pulse 99   Temp 98.2 F (36.8 C) (Oral)   Resp 16   Wt 170 lb (77.1 kg)   SpO2 98%   BMI 28.73 kg/m  Gen: NAD, resting comfortably CV: RRR no murmurs rubs or gallops No breast lumps- describes pain over right breast and into chest wall as well as into right chest - slightly  worse with palpation Lungs: CTAB no crackles, wheeze, rhonchi Ext: no edema Skin: warm, dry Neuro: walks with walker   Assessment/Plan:  Right and left breast/possible chest wall pain S: felt like pulled muscle in chest last month- has had some right sided chest pain. Is improving. No lumps or bumps in the breast. She wonders if this could be related to her chronic back pains. She has tried [employer] which gives some relief. Discussed nsaids increase bleeding risks with SSRI so would prefer tylenol A/P: will refer for diagnostic mammogram given breast pain- though I also suspect may simply be in chest wall.   Cervical spinal stenosis S:  chronic pain - remains on gabentin 300mg  3x a day. Also on baclofen for muscle spasms. Had been given short course of norco in ER back in March- removed this from med list.   Today patient states pain has been reasonably controlled but she still has chronic irritation in low back, neck, right shoulder and also a waist down tightness sensation.   She has seen Dr. Paulla Fore and had some relief for her shoulder pain but she wonders if she could get further relief for her back  See note 12/18/17 with info from outside offices. Repeated again below  "Back Pain  lower back pain radiates into bilateral legs, patient describes legs as feeling cold & heavy.  Back Pain  This is a chronic problem. Episode onset: onset approximately 2012 to 2014. The pain is at a severity of 9/10. Associated symptoms include bladder incontinence, bowel incontinence, leg pain, numbness, paresis, pelvic pain, perianal numbness and weakness. Pertinent negatives include no abdominal pain, chest pain, dysuria, fever, headaches, paresthesias or weight loss. She has tried analgesics, NSAIDs and heat for the symptoms. The treatment provided no relief.   Brianna Saunders is seen in the office today for evaluation of chronic diffuse pain with a complex medical history.  Patient is a very poor historian and  essentially all the information is obtained from extensive records from [facility] presented today in a packet and reviewed subsequently. She is able to tell me about one cervical surgery that she had but she does not remember that she had a second surgery for a laminectomy of the low back in December 2014.   She gives a very poor history but reports some type of spontaneous problems involving her arms approximately 4 years ago. Records indicate February 12, 2013 she did undergo an MRI scan of the cervical, thoracic and lumbar spine.  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 There was abnormal signal extending throughout the central cord from C5-6, C6-7, C7-T1. Multilevel uncovertebral and ligamentum flavum hypertrophy throughout cervical spine was most severe at C6-7. There was grade 1 anterolisthesis of C7 on T1. Mild broad-based disc protrusions were present at T5-6 and T6-7. Spinal stenosis was also prominent in the lumbar region at L3-L4 and L4-L5 due to a combination of degenerative disc disease and facet arthropathy along with thickening of the ligamenta flava.  Patient subsequently underwent anterior cervical discectomy and fusion C6-T1 approximately February 13, 2013 with C7 laminectomy.   Most recent cervical spine MRI study after fusion February 14, 2013 showed status post interval posterior fusion of C6-T1 with C7 laminectomy and an improvement in alignment. There was no evidence of immediate complication.  Although not remembered by the patient she has subsequently undergone a L4 laminectomy and decompression including resection of a right L4-5 synovial cyst October 11, 2013.  Patient now complains of pain throughout her body in numerous areas. She has been previously treated with combination of baclofen for spasticity and along  with gabapentin for chronic neurogenic pain.  Patient reports having moved to [place] from [place] October 2016. She is only seen her primary care physician here and was sent here for further evaluation regarding her diffuse back neck and body pain.   Presently the patient remains on baclofen and gabapentin. She has evidence of spasticity, specifically increased clonus and hyperreflexia of the lower extremities.  At the present time we will try a short course of therapies although I'm not entirely optimistic that this will be beneficial for her considering that she has persistent incomplete spinal cord injury.  I'll plan to see her in follow-up in approximately 4-6 weeks' time and we will at least have had a chance to review her medical records" A/P: Reasonable control of pain but patient would still like to pursue further evaluation and potential treatment. I had spoken to Dr. Paulla Fore in the past who suggested Dr. Maryjean Ka at [facility] to consider options like spinal cord stimulator- will refer at this time. She has a rather impressive MRI as above and we have discussed with patient that pain  may simply be lifelong. I dont want to further sedate her but would consider amitriptyline (some risk as already on SSRI though) or doxepin if she is having sleep issues   Hypertension S: controlled on repeat on- amlodipine 10 mg, lisinopril 40 mg, metoprol 50 mg twice daily. BP Readings from Last 3 Encounters:  08/27/18 138/88  05/07/18 136/84  02/08/18 (!) 156/89  A/P: We discussed blood pressure goal of <140/90. Continue current meds:  Missed some of her meds today- encouraged  aher miss very infrequently  Depression, major, single episode, in partial remission ([place]) S: compliant with celexa 40mg  (out for 2-3 days and symptoms have worsened) and wellbutrin 150mg  XR. Last visit phq9 was at 8 but she was taking 3 wellbutrin and we asked her to cut down to 2. She would like out to get out  more but given her dependence on walker, incontinence issues- it just simply isnt as fun as she would like for it to be- spends more time at home as a result A/P: a lot of these depressed symptoms are situational. Wonder if we could see further improvement with counseling- gave handout today. Also told her dont want her running out of celexa- refilled these today  Cigarette smoker S: Last visit she was down to 2 cigarettes a day on 3 Wellbutrin per day- now up to 4 a day.  We asked her to cut back to 300 mg of Wellbutrin. PHQ9 of 10 today.  A/P: encouraged complete cessation. One of her few enjoyments given chronic pain.   Chronic pain See spinal stenosis section   Needs pap at follow up Future Appointments  Date Time Provider [employer]  12/03/2018  2:20 PM Marin Olp, MD [facility]   Lab/Order associations: Encounter for hepatitis C screening test for low risk patient - Plan: Hepatitis C antibody  Screening for HIV (human immunodeficiency virus) - Plan: HIV Antibody (routine testing w rflx)  Essential hypertension - Plan: CBC, Comprehensive metabolic panel  Right-sided chest pain - Plan: MM Digital Diagnostic Bilat  Left-sided chest pain - Plan: MM Digital Diagnostic Bilat  Breast pain, right - Plan: MM Digital Diagnostic Bilat  Breast pain, left - Plan: MM Digital Diagnostic Bilat  Cervical spinal stenosis - Plan: Ambulatory referral to Neurosurgery  Depression, major, single episode, in partial remission ([place])  Cigarette smoker  Meds ordered this encounter  Medications  . amLODipine (NORVASC) 10 MG tablet    Sig: Take 1 tablet (10 mg total) by mouth daily.    Dispense:  90 tablet    Refill:  1  . baclofen (LIORESAL) 10 MG tablet    Sig: TAKE 1 TABLET BY MOUTH THREE TIMES A DAY AS NEEDED FOR MUSCLE SPASMS    Dispense:  90 tablet    Refill:  5  . buPROPion (WELLBUTRIN XL) 150 MG 24 hr tablet    Sig: Take 2 tablets (300 mg total) by mouth daily.     Dispense:  180 tablet    Refill:  1  . citalopram (CELEXA) 40 MG tablet    Sig: Take 1 tablet (40 mg total) by mouth daily.    Dispense:  90 tablet    Refill:  1  . gabapentin (NEURONTIN) 300 MG capsule    Sig: Take 3 capsules (900 mg total) by mouth 3 (three) times daily.    Dispense:  810 capsule    Refill:  1  . lisinopril (PRINIVIL,ZESTRIL) 40 MG tablet    Sig: Take 1  tablet (40 mg total) by mouth daily.    Dispense:  90 tablet    Refill:  1  . metoprolol tartrate (LOPRESSOR) 50 MG tablet    Sig: Take 1 tablet (50 mg total) by mouth 2 (two) times daily.    Dispense:  180 tablet    Refill:  1   Return precautions advised.  Garret Reddish, MD

## 2018-08-27 NOTE — Assessment & Plan Note (Signed)
S: Last visit she was down to 2 cigarettes a day on 3 Wellbutrin per day- now up to 4 a day.  We asked her to cut back to 300 mg of Wellbutrin. PHQ9 of 10 today.  A/P: encouraged complete cessation. One of her few enjoyments given chronic pain.

## 2018-08-27 NOTE — Assessment & Plan Note (Signed)
S:  chronic pain - remains on gabentin 300mg  3x a day. Also on baclofen for muscle spasms. Had been given short course of norco in ER back in March- removed this from med list.   Today patient states pain has been reasonably controlled but she still has chronic irritation in low back, neck, right shoulder and also a waist down tightness sensation.   She has seen Dr. Paulla Fore and had some relief for her shoulder pain but she wonders if she could get further relief for her back  See note 12/18/17 with info from outside offices. Repeated again below  "Back Pain  lower back pain radiates into bilateral legs, patient describes legs as feeling cold & heavy.   Back Pain  This is a chronic problem. Episode onset: onset approximately 2012 to 2014. The pain is at a severity of 9/10. Associated symptoms include bladder incontinence, bowel incontinence, leg pain, numbness, paresis, pelvic pain, perianal numbness and weakness. Pertinent negatives include no abdominal pain, chest pain, dysuria, fever, headaches, paresthesias or weight loss. She has tried analgesics, NSAIDs and heat for the symptoms. The treatment provided no relief.   Brianna Saunders is seen in the office today for evaluation of chronic diffuse pain with a complex medical history.  Patient is a very poor historian and essentially all the information is obtained from extensive records from [facility] presented today in a packet and reviewed subsequently. She is able to tell me about one cervical surgery that she had but she does not remember that she had a second surgery for a laminectomy of the low back in December 2014.   She gives a very poor history but reports some type of spontaneous problems involving her arms approximately 4 years ago. Records indicate February 12, 2013 she did undergo an MRI scan of the cervical, thoracic and lumbar spine.  The MRI scan of her cervical spine showed severe spinal stenosis at the  level of C7 resulting in cord compression with increased T2 signal throughout the entire cord at this level. There was abnormal signal extending throughout the central cord from C5-6, C6-7, C7-T1. Multilevel uncovertebral and ligamentum flavum hypertrophy throughout cervical spine was most severe at C6-7. There was grade 1 anterolisthesis of C7 on T1. Mild broad-based disc protrusions were present at T5-6 and T6-7. Spinal stenosis was also prominent in the lumbar region at L3-L4 and L4-L5 due to a combination of degenerative disc disease and facet arthropathy along with thickening of the ligamenta flava.  Patient subsequently underwent anterior cervical discectomy and fusion C6-T1 approximately February 13, 2013 with C7 laminectomy.   Most recent cervical spine MRI study after fusion February 14, 2013 showed status post interval posterior fusion of C6-T1 with C7 laminectomy and an improvement in alignment. There was no evidence of immediate complication.  Although not remembered by the patient she has subsequently undergone a L4 laminectomy and decompression including resection of a right L4-5 synovial cyst October 11, 2013.  Patient now complains of pain throughout her body in numerous areas. She has been previously treated with combination of baclofen for spasticity and along with gabapentin for chronic neurogenic pain.  Patient reports having moved to [place] from [place] October 2016. She is only seen her primary care physician here and was sent here for further evaluation regarding her diffuse back neck and body pain.   Presently the patient remains on baclofen and gabapentin. She has evidence of spasticity, specifically increased clonus and hyperreflexia of the lower  extremities.  At the present time we will try a short course of therapies although I'm not entirely optimistic that this will be beneficial for her considering that she has persistent incomplete spinal cord injury.  I'll plan to  see her in follow-up in approximately 4-6 weeks' time and we will at least have had a chance to review her medical records" A/P: Reasonable control of pain but patient would still like to pursue further evaluation and potential treatment. I had spoken to Dr. Paulla Fore in the past who suggested Dr. Maryjean Ka at [facility] to consider options like spinal cord stimulator- will refer at this time. She has a rather impressive MRI as above and we have discussed with patient that pain may simply be lifelong. I dont want to further sedate her but would consider amitriptyline (some risk as already on SSRI though) or doxepin if she is having sleep issues

## 2018-08-27 NOTE — Patient Instructions (Addendum)
Please stop by lab before you go  Our team will let you know how to get mammogram set up  Schedule next available visit within a few months as we still need to do your pap smear.

## 2018-08-28 LAB — CBC
HEMATOCRIT: 34.8 % — AB (ref 36.0–46.0)
Hemoglobin: 11.3 g/dL — ABNORMAL LOW (ref 12.0–15.0)
MCHC: 32.4 g/dL (ref 30.0–36.0)
MCV: 87.1 fl (ref 78.0–100.0)
Platelets: 238 10*3/uL (ref 150.0–400.0)
RBC: 4 Mil/uL (ref 3.87–5.11)
RDW: 16.1 % — ABNORMAL HIGH (ref 11.5–15.5)
WBC: 7.2 10*3/uL (ref 4.0–10.5)

## 2018-08-28 LAB — COMPREHENSIVE METABOLIC PANEL
ALT: 10 U/L (ref 0–35)
AST: 12 U/L (ref 0–37)
Albumin: 4.1 g/dL (ref 3.5–5.2)
Alkaline Phosphatase: 69 U/L (ref 39–117)
BUN: 23 mg/dL (ref 6–23)
CO2: 26 mEq/L (ref 19–32)
CREATININE: 1.07 mg/dL (ref 0.40–1.20)
Calcium: 9.4 mg/dL (ref 8.4–10.5)
Chloride: 108 mEq/L (ref 96–112)
GFR: 66.58 mL/min (ref >60.00)
GLUCOSE: 78 mg/dL (ref 70–99)
POTASSIUM: 4.1 meq/L (ref 3.5–5.1)
Sodium: 143 mEq/L (ref 135–145)
TOTAL PROTEIN: 7.7 g/dL (ref 6.0–8.3)
Total Bilirubin: 0.3 mg/dL (ref 0.2–1.2)

## 2018-08-28 LAB — HEPATITIS C ANTIBODY
Hepatitis C Ab: NONREACTIVE
SIGNAL TO CUT-OFF: 0.04 (ref <1.00)

## 2018-08-28 LAB — HIV ANTIBODY (ROUTINE TESTING W REFLEX): HIV 1&2 Ab, 4th Generation: NONREACTIVE

## 2018-08-31 ENCOUNTER — Other Ambulatory Visit: Payer: Self-pay | Admitting: Family Medicine

## 2018-08-31 DIAGNOSIS — R079 Chest pain, unspecified: Secondary | ICD-10-CM

## 2018-08-31 DIAGNOSIS — N644 Mastodynia: Secondary | ICD-10-CM

## 2018-09-23 ENCOUNTER — Ambulatory Visit: Payer: Medicare Other | Admitting: Sports Medicine

## 2018-09-23 ENCOUNTER — Telehealth: Payer: Self-pay | Admitting: Sports Medicine

## 2018-09-23 ENCOUNTER — Other Ambulatory Visit: Payer: Self-pay | Admitting: Family Medicine

## 2018-09-23 NOTE — Telephone Encounter (Signed)
Patient came into the office 15 min late and was advised of the 10 min no show policy. Hailey looked at Dr. Nicolasa Ducking schedule to see if they could work the patient in however, Dr. Paulla Fore was full for the day. Hailey did ask the nurse if the patient could be worked in however, Dr. Paulla Fore stated that his schedule was full. The patient mentioned to [facility] that "she could not move her arm at all." Dr. Paulla Fore suggested "they head straight to urgent care or the ED." I came to deliver the message to the patient after introducing myself and she said that "she always has to wait 15 min anyway so why can't he see her." I advised that "although the lobby is empty the treatment rooms are full and he is seeing his patients." I offered multiple times starting tomorrow to move the appointment to and the rest of the week and she declined. She "agreed to go to urgent care" and she and her family member left the office after asking for Dr. Nicolasa Ducking card which I provided.

## 2018-09-24 ENCOUNTER — Encounter: Payer: Self-pay | Admitting: Sports Medicine

## 2018-09-24 ENCOUNTER — Ambulatory Visit: Payer: Self-pay

## 2018-09-24 ENCOUNTER — Ambulatory Visit (INDEPENDENT_AMBULATORY_CARE_PROVIDER_SITE_OTHER): Payer: Medicare Other | Admitting: Sports Medicine

## 2018-09-24 VITALS — BP 118/78 | HR 77 | Ht 64.5 in | Wt 174.6 lb

## 2018-09-24 DIAGNOSIS — R262 Difficulty in walking, not elsewhere classified: Secondary | ICD-10-CM

## 2018-09-24 DIAGNOSIS — G8929 Other chronic pain: Secondary | ICD-10-CM

## 2018-09-24 DIAGNOSIS — M25511 Pain in right shoulder: Secondary | ICD-10-CM

## 2018-09-24 DIAGNOSIS — M4802 Spinal stenosis, cervical region: Secondary | ICD-10-CM | POA: Diagnosis not present

## 2018-09-24 NOTE — Procedures (Signed)
PROCEDURE NOTE:  Ultrasound Guided: Injection: Right shoulder Images were obtained and interpreted by myself, Teresa Coombs, DO  Images have been saved and stored to PACS system. Images obtained on: GE S7 Ultrasound machine    ULTRASOUND FINDINGS:  No significant joint effusion  DESCRIPTION OF PROCEDURE:  The patient's clinical condition is marked by substantial pain and/or significant functional disability. Other conservative therapy has not provided relief, is contraindicated, or not appropriate. There is a reasonable likelihood that injection will significantly improve the patient's pain and/or functional impairment.   After discussing the risks, benefits and expected outcomes of the injection and all questions were reviewed and answered, the patient wished to undergo the above named procedure.  Verbal consent was obtained.  The ultrasound was used to identify the target structure and adjacent neurovascular structures. The skin was then prepped in sterile fashion and the target structure was injected under direct visualization using sterile technique as below:  Single injection performed as below: PREP: Alcohol and Ethel Chloride APPROACH:posterior, single injection, 21g 2 in. INJECTATE: 2 cc 0.5% Marcaine and 2 cc 40mg /mL DepoMedrol ASPIRATE: None DRESSING: Band-Aid  Post procedural instructions including recommending icing and warning signs for infection were reviewed.    This procedure was well tolerated and there were no complications.   IMPRESSION: Succesful Ultrasound Guided: Injection

## 2018-09-24 NOTE — Progress Notes (Signed)
Brianna Saunders. [place], [place] at [facility] [phone]  Brianna Saunders - 62 y.o. female MRN [id number]  Date of birth: [birth year]  Visit Date: 09/24/2018  PCP: Brianna Olp, MD   Referred by: Brianna Olp, MD  Scribe(s) for today's visit: Wendy Poet, LAT, ATC  SUBJECTIVE:  Brianna Saunders is here for Follow-up (R shoulder/arm pain) .    HPI:  01/28/18: Her R shoulder pain symptoms INITIALLY: Began 01/21/18 and pt reports no known trauma or injury. She does have dx of chronic pain. Described as moderate (6/10) aching, radiating to the R bicep Worsened with activity.  Improved with rest and medications given by Inda Coke last week.  Additional associated symptoms include: She reports decreased ROM, denies stiffness, numbness, tingling.     At this time symptoms are worsening compared to onset, pain is constant.  She takes Baclofen TID and Gabapentin 300 mg TID for chronic pain. She has tried using heat on the shoulder with no relief. She has tried taking Acetaminophen and NSAID's with no relief. She was seen by Inda Coke 01/23/18 and prescribed Prednisone 10 mg to take TID for 5 days that provided some relief.  No recent imaging of the R shoulder  05/07/2018: Compared to the last office visit on 01/28/18, her previously described R shoulder pain symptoms had been improving until Saturday when her symptoms flared up.  She is having the most pain w/ attempting to aBd her R shoulder. Current symptoms are moderate & are radiating to R lateral upper arm. She has completed 10 PT visits.  She's taking Baclofen and Gabapentin 300 mg TID.  She had a depomedrol injection into her R shoulder at her last visit which she feels helped her symptoms.   Had a R shoulder XR on 01/28/18.  09/24/2018: Compared to the last office visit, her previously described symptoms are worsening. She c/o difficulty lifting the arm d/t  pain.  Current symptoms are moderate-severe & are radiating to the R arm but not beyond the elbow. Worse with reaching overhead. She c/o decreased ROM. She denies clicking or popping. She denies neck pain.  She has been taking Baclofen and Gabapentin TID with minimal relief. She has tried Aleve and Tylenol with no relief. She has tried icing the shoulder with temporary relief.   REVIEW OF SYSTEMS: Reports night time disturbances. Denies fevers, chills, or night sweats. Denies unexplained weight loss. Denies personal history of cancer. Denies changes in bowel or bladder habits. Denies recent unreported falls. Denies new or worsening dyspnea or wheezing. Denies headaches or dizziness.  Reports numbness, tingling or weakness in the extremities - in B feet Denies dizziness or presyncopal episodes Denies lower extremity edema    HISTORY:  Prior history reviewed and updated per electronic medical record.  Social History   Occupational History  . Not on file  Tobacco Use  . Smoking status: Current Every Day Smoker    Packs/day: 0.50    Years: 45.00    Pack years: 22.50    Types: Cigarettes    Start date: 11/11/1969  . Smokeless tobacco: Never Used  . Tobacco comment: 2 cigarettes a day  Substance and Sexual Activity  . Alcohol use: No    Frequency: Never  . Drug use: No  . Sexual activity: Never   Social History   Social History Narrative   2LIves with her son and granddaughter- now 51 (often with patient). Also has 1 cat.  Moved to [place] from [place]- slightly closer to family.    Does cleaning/cooking      Disabled due to worsening back/surgeries. 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 Wear and tear from job.    12th grade education      Hobbies: solitaire on laptop, enjoys doing puzzles on the computer    Past Medical History:  Diagnosis Date  . Chicken pox   . Hypertension    Past Surgical History:  Procedure Laterality Date  . FRACTURE  SURGERY    . PARTIAL HYSTERECTOMY    . SPINE SURGERY     lumbar spine- around 2014  . upper back surgery     states plate in neck and into right shoulder blade reportedly- around 2014   family history includes Early death in her sister; Heart attack in her maternal grandmother and mother; Hypertension in her father and mother; Kidney disease in her brother and father.  DATA OBTAINED & REVIEWED:  No results for input(s): HGBA1C, LABURIC, CREATINE in the last 8760 hours. No problems updated. .   OBJECTIVE:  VS:  HT:5' 4.5" (163.8 cm)   WT:174 lb 9.6 oz (79.2 kg)  BMI:29.52    BP:118/78  HR:77bpm  TEMP: ( )  RESP:96 %   PHYSICAL EXAM: CONSTITUTIONAL: Well-developed, Well-nourished and In no acute distress PSYCHIATRIC: Alert & appropriately interactive. and Not depressed or anxious appearing. RESPIRATORY: No increased work of breathing and Trachea Midline EYES: Pupils are equal., EOM intact without nystagmus. and No scleral icterus.  VASCULAR EXAM: Warm and well perfused NEURO: unremarkable Uses a walker.  Limited mobility.  MSK Exam: Right shoulder  Well aligned, no significant deformity. No overlying skin changes. TTP over Generalized shoulder.  Worse in the posterior capsule.   RANGE OF MOTION & STRENGTH  Limited and painful: Shoulder abduction, internal rotation and external rotation.   SPECIALITY TESTING:  Positive Hawkins and Neer's  Limited external rotation.  Negative brachial plexus squeeze and arm squeeze test    ASSESSMENT   1. Chronic right shoulder pain   2. Cervical spinal stenosis   3. Difficulty walking     PLAN:  Pertinent additional documentation may be included in corresponding procedure notes, imaging studies, problem based documentation and patient instructions.  Procedures:  . US Guided Injection per procedure note  Medications:  No orders of the defined types were placed in this encounter.  Discussion/Instructions: No  problem-specific Assessment & Plan notes found for this encounter.  . Recurrent shoulder pain that has responded well previously to intra-articular injection.  We will plan to follow-up with her in 12 weeks and can consider repeating injections at that time given the shoulder dependence with the use of her walker. . Discussed red flag symptoms that warrant earlier emergent evaluation and patient voices understanding. . Activity modifications and the importance of avoiding exacerbating activities (limiting pain to no more than a 4 / 10 during or following activity) recommended and discussed.  Follow-up:  . Return in about 12 weeks (around 12/17/2018) for consideration of repeat injections.  . At follow up will plan: to consider repeat corticosteroid injections     CMA/ATC served as scribe during this visit. History, Physical, and Plan performed by medical provider. Documentation and orders reviewed and attested to.      Gerda Diss, [facility] Physician

## 2018-09-24 NOTE — Patient Instructions (Addendum)
You had an injection today.  Things to be aware of after injection are listed below: . You may experience no significant improvement or even a slight worsening in your symptoms during the first 24 to 48 hours.  After that we expect your symptoms to improve gradually over the next 2 weeks for the medicine to have its maximal effect.  You should continue to have improvement out to 6 weeks after your injection. . Dr. Royelle Hinchman recommends icing the site of the injection for 20 minutes  1-2 times the day of your injection . You may shower but no swimming, tub bath or Jacuzzi for 24 hours. . If your bandage falls off this does not need to be replaced.  It is appropriate to remove the bandage after 4 hours. . You may resume light activities as tolerated unless otherwise directed per Dr. Toussaint Golson during your visit  POSSIBLE STEROID SIDE EFFECTS:  Side effects from injectable steroids tend to be less than when taken orally however you may experience some of the symptoms listed below.  If experienced these should only last for a short period of time. Change in menstrual flow  Edema (swelling)  Increased appetite Skin flushing (redness)  Skin rash/acne  Thrush (oral) Yeast vaginitis    Increased sweating  Depression Increased blood glucose levels Cramping and leg/calf  Euphoria (feeling happy)  POSSIBLE PROCEDURE SIDE EFFECTS: The side effects of the injection are usually fairly minimal however if you may experience some of the following side effects that are usually self-limited and will is off on their own.  If you are concerned please feel free to call the office with questions:  Increased numbness or tingling  Nausea or vomiting  Swelling or bruising at the injection site   Please call our office if if you experience any of the following symptoms over the next week as these can be signs of infection:   Fever greater than 100.5F  Significant swelling at the injection site  Significant redness or drainage  from the injection site  If after 2 weeks you are continuing to have worsening symptoms please call our office to discuss what the next appropriate actions should be including the potential for a return office visit or other diagnostic testing.    

## 2018-09-24 NOTE — Telephone Encounter (Signed)
Noted, pt seen in office today. She was seen for R shoulder pain, she was not having L arm weakness. No further action required.

## 2018-09-26 ENCOUNTER — Encounter: Payer: Self-pay | Admitting: Sports Medicine

## 2018-10-14 ENCOUNTER — Ambulatory Visit
Admission: RE | Admit: 2018-10-14 | Discharge: 2018-10-14 | Disposition: A | Discharge status: Home / Self Care | Payer: Medicare Other | Source: Ambulatory Visit | Attending: Family Medicine | Admitting: Family Medicine

## 2018-10-14 ENCOUNTER — Ambulatory Visit: Admission: RE | Admit: 2018-10-14 | Payer: Medicare Other | Source: Ambulatory Visit

## 2018-10-14 DIAGNOSIS — N644 Mastodynia: Secondary | ICD-10-CM

## 2018-10-14 DIAGNOSIS — R079 Chest pain, unspecified: Secondary | ICD-10-CM

## 2018-10-14 DIAGNOSIS — R928 Other abnormal and inconclusive findings on diagnostic imaging of breast: Secondary | ICD-10-CM | POA: Diagnosis not present

## 2018-10-28 DIAGNOSIS — M542 Cervicalgia: Secondary | ICD-10-CM | POA: Diagnosis not present

## 2018-10-28 DIAGNOSIS — G959 Disease of spinal cord, unspecified: Secondary | ICD-10-CM | POA: Diagnosis not present

## 2018-11-02 ENCOUNTER — Other Ambulatory Visit: Payer: Self-pay | Admitting: Anesthesiology

## 2018-11-02 DIAGNOSIS — G959 Disease of spinal cord, unspecified: Secondary | ICD-10-CM

## 2018-11-02 DIAGNOSIS — M542 Cervicalgia: Secondary | ICD-10-CM

## 2018-11-17 ENCOUNTER — Ambulatory Visit
Admission: RE | Admit: 2018-11-17 | Discharge: 2018-11-17 | Disposition: A | Discharge status: Home / Self Care | Payer: Medicare Other | Source: Ambulatory Visit | Attending: Anesthesiology | Admitting: Anesthesiology

## 2018-11-17 DIAGNOSIS — M5126 Other intervertebral disc displacement, lumbar region: Secondary | ICD-10-CM | POA: Diagnosis not present

## 2018-11-17 DIAGNOSIS — M542 Cervicalgia: Secondary | ICD-10-CM

## 2018-11-17 DIAGNOSIS — G959 Disease of spinal cord, unspecified: Secondary | ICD-10-CM

## 2018-11-17 DIAGNOSIS — M48061 Spinal stenosis, lumbar region without neurogenic claudication: Secondary | ICD-10-CM | POA: Diagnosis not present

## 2018-11-17 MED ORDER — GADOBENATE DIMEGLUMINE 529 MG/ML IV SOLN
16.0000 mL | Freq: Once | INTRAVENOUS | Status: AC | PRN
  Administered 2018-11-17: 16 mL via INTRAVENOUS

## 2018-12-03 ENCOUNTER — Ambulatory Visit: Payer: Medicare Other | Admitting: Family Medicine

## 2018-12-07 ENCOUNTER — Telehealth: Payer: Self-pay

## 2018-12-07 ENCOUNTER — Encounter: Payer: Self-pay | Admitting: Family Medicine

## 2018-12-07 NOTE — Telephone Encounter (Signed)
-----   Message from Marin Olp, MD sent at 12/03/2018  7:50 AM EST ----- Please call to encourage patient to complete her Cologuard ----- Message ----- From: SYSTEM Sent: 12/23/2017  12:06 AM EST To: Marin Olp, MD

## 2018-12-07 NOTE — Telephone Encounter (Signed)
Roselyn Reef- that's perfect - thanks so much for researching this.

## 2018-12-07 NOTE — Telephone Encounter (Signed)
Called and spoke with patient who states she completed and sent her kit back. She stated she got a letter in the mail that stated it was fine. I am calling [facility] to ask that a copy of the result be faxed to our office.  Called [facility] who confirmed that they did receive her kit and the result was negative on 07/29/2018. I have abstracted the results and they are faxing a copy of the report.

## 2018-12-17 ENCOUNTER — Ambulatory Visit: Payer: Medicare Other | Admitting: Sports Medicine

## 2018-12-22 DIAGNOSIS — M48061 Spinal stenosis, lumbar region without neurogenic claudication: Secondary | ICD-10-CM | POA: Diagnosis not present

## 2018-12-22 DIAGNOSIS — M961 Postlaminectomy syndrome, not elsewhere classified: Secondary | ICD-10-CM | POA: Diagnosis not present

## 2019-01-07 DIAGNOSIS — M48061 Spinal stenosis, lumbar region without neurogenic claudication: Secondary | ICD-10-CM | POA: Diagnosis not present

## 2019-02-09 DIAGNOSIS — M48061 Spinal stenosis, lumbar region without neurogenic claudication: Secondary | ICD-10-CM | POA: Diagnosis not present

## 2019-02-10 ENCOUNTER — Ambulatory Visit: Payer: Self-pay

## 2019-02-10 NOTE — Telephone Encounter (Signed)
F.Y.I  I called pt back to check on her.  Pt explained that she did not go to the ED but did call EMT.  Pt said that they checked her throat and her breathing and \\informed  her to keep a eye on the swelling.  Pt took Benadryl and was told to ice swelling.  EMT informed pt that if swelling doesn't continue to go down then to head to an urgent care.  Pt verbalized that she will let office know if swelling had not went down completely by tomorrow.

## 2019-02-10 NOTE — Telephone Encounter (Signed)
Daughter called to report lips tongue and throat is swollen, pt stated "my throat feels like it is closing up." Advised daughter to call 911 now. Daughter verbalized understanding.  Reason for Disposition . Swollen tongue  Answer Assessment - Initial Assessment Questions 1. ONSET: "When did the swelling start?" (e.g., minutes, hours, days)     yesterday 2. LOCATION: "What part of the face is swollen?"    Lips tongue. Cheeks throat 3. SEVERITY: "How swollen is it?"    Pt stated she her throat was "closing up"  Protocols used: [facility]

## 2019-02-10 NOTE — Telephone Encounter (Signed)
Will monitor for ED arrival.  

## 2019-03-10 ENCOUNTER — Other Ambulatory Visit: Payer: Self-pay | Admitting: Family Medicine

## 2019-03-10 NOTE — Telephone Encounter (Signed)
Would be reasonable to set up video visit for follow up.

## 2019-03-10 NOTE — Telephone Encounter (Signed)
Baclofen 10MG  Last filled 08/27/18  #90/5 Last ov 08/27/18 No scheduled visit. Does he need a VV?

## 2019-03-11 NOTE — Telephone Encounter (Signed)
Spoke to pt and informed her that an appt. Will be needed for refills. Pt stated that she can try to do the virtual visit but its computer literate. Pt stated she will get one of her family members to help set this up. Pt will call back Monday to set this up.

## 2019-03-15 NOTE — Telephone Encounter (Signed)
Pt has been scheduled.  °

## 2019-03-16 ENCOUNTER — Ambulatory Visit (INDEPENDENT_AMBULATORY_CARE_PROVIDER_SITE_OTHER): Payer: Medicare Other | Admitting: Family Medicine

## 2019-03-16 ENCOUNTER — Encounter: Payer: Self-pay | Admitting: Family Medicine

## 2019-03-16 VITALS — Ht 64.5 in | Wt 169.0 lb

## 2019-03-16 DIAGNOSIS — F3342 Major depressive disorder, recurrent, in full remission: Secondary | ICD-10-CM

## 2019-03-16 DIAGNOSIS — F17210 Nicotine dependence, cigarettes, uncomplicated: Secondary | ICD-10-CM

## 2019-03-16 DIAGNOSIS — I10 Essential (primary) hypertension: Secondary | ICD-10-CM | POA: Diagnosis not present

## 2019-03-16 MED ORDER — GABAPENTIN 300 MG PO CAPS: 900.0000 mg | ORAL_CAPSULE | Freq: Three times a day (TID) | ORAL | 1 refills | Status: DC

## 2019-03-16 MED ORDER — BACLOFEN 10 MG PO TABS: ORAL_TABLET | ORAL | 1 refills | Status: AC

## 2019-03-16 MED ORDER — METOPROLOL TARTRATE 50 MG PO TABS: 50.0000 mg | ORAL_TABLET | Freq: Two times a day (BID) | ORAL | 1 refills | Status: DC

## 2019-03-16 MED ORDER — LISINOPRIL 40 MG PO TABS: 40.0000 mg | ORAL_TABLET | Freq: Every day | ORAL | 1 refills | Status: DC

## 2019-03-16 MED ORDER — BUPROPION HCL ER (XL) 150 MG PO TB24: 300.0000 mg | ORAL_TABLET | Freq: Every day | ORAL | 1 refills | Status: DC

## 2019-03-16 MED ORDER — AMLODIPINE BESYLATE 10 MG PO TABS: 10.0000 mg | ORAL_TABLET | Freq: Every day | ORAL | 1 refills | Status: DC

## 2019-03-16 MED ORDER — CITALOPRAM HYDROBROMIDE 40 MG PO TABS: 40.0000 mg | ORAL_TABLET | Freq: Every day | ORAL | 1 refills | Status: DC

## 2019-03-16 NOTE — Patient Instructions (Addendum)
Health Maintenance Due  Topic Date Due  . TETANUS/TDAP Will discuss during visit. 07/13/1974  . PAP SMEAR-Modifier Pt has not had one in a while. 07/13/1976    Depression screen PHQ 2/9 03/16/2019 08/27/2018 12/18/2017  Decreased Interest 0 1 2  Down, Depressed, Hopeless 0 3 2  PHQ - 2 Score 0 4 4  Altered sleeping 0 0 0  Tired, decreased energy 3 2 0  Change in appetite 3 3 1   Feeling bad or failure about yourself  0 1 3  Trouble concentrating 0 0 0  Moving slowly or fidgety/restless 0 0 0  Suicidal thoughts 0 0 0  PHQ-9 Score 6 10 8   Difficult doing work/chores Not difficult at all - Somewhat difficult   Phone visit- she attempted video but did not work

## 2019-03-16 NOTE — Progress Notes (Signed)
Phone [phone]   Subjective:  Virtual visit via phonenote Chief Complaint  Patient presents with  . Follow-up    Hypertension, depression   This visit type was conducted due to national recommendations for restrictions regarding the COVID-19 Pandemic (e.g. social distancing).  This format is felt to be most appropriate for this patient at this time balancing risks to patient and risks to population by having him in for in person visit.  All issues noted in this document were discussed and addressed.  No physical exam was performed (except for noted visual exam or audio findings with Telehealth visits).  The patient has consented to conduct a Telehealth visit and understands insurance will be billed.   Our team/I connected with Brianna Saunders on 03/16/19 at  1:00 PM EDT by phone (patient did not have equipment for webex) and verified that I am speaking with the correct person using two identifiers.  Location patient: [place] Location provider: [facility], office Persons participating in the virtual visit:  patient  Time on phone: 12 minutes Counseling provided about covid 42 (daughter helping her get groeries)   Our team/I discussed the limitations of evaluation and management by telemedicine and the availability of in person appointments. In light of current covid-19 pandemic, patient also understands that we are trying to protect them by minimizing in office contact if at all possible.  The patient expressed consent for telemedicine visit and agreed to proceed. Patient understands insurance will be billed.   ROS- no fever/chills/new cough (smokers cough)/sore throat/shortness of breath   Past Medical History-  Patient Active Problem List   Diagnosis Date Noted  . Cervical spinal stenosis 08/27/2018    Priority: High  . Chronic pain 11/27/2017    Priority: High  . Hypertension 11/27/2017    Priority: Medium  . 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 Difficulty walking 01/28/2018  . Cigarette smoker 12/18/2017    Medications- reviewed and updated Current Outpatient Medications  Medication Sig Dispense Refill  . amLODipine (NORVASC) 10 MG tablet Take 1 tablet (10 mg total) by mouth daily. 90 tablet 1  . baclofen (LIORESAL) 10 MG tablet TAKE 1 TABLET BY MOUTH THREE TIMES A DAY AS NEEDED FOR MUSCLE SPASMS 270 tablet 1  . buPROPion (WELLBUTRIN XL) 150 MG 24 hr tablet Take 2 tablets (300 mg total) by mouth daily. 180 tablet 1  . Cholecalciferol (VITAMIN D3) 50000 units TABS Take 1 tablet by mouth once a week.    . citalopram (CELEXA) 40 MG tablet Take 1 tablet (40 mg total) by mouth daily. 90 tablet 1  . gabapentin (NEURONTIN) 300 MG capsule Take 3 capsules (900 mg total) by mouth 3 (three) times daily. 810 capsule 1  . lisinopril (ZESTRIL) 40 MG tablet Take 1 tablet (40 mg total) by mouth daily. 90 tablet 1  . metoprolol tartrate (LOPRESSOR) 50 MG tablet Take 1 tablet (50 mg total) by mouth 2 (two) times daily. 180 tablet 1   No current facility-administered medications for this visit.      Objective:  Ht 5' 4.5" (1.638 m)   Wt 169 lb (76.7 kg)   BMI 28.56 kg/m  self reported vitals  Nonlabored voice, normal speech      Assessment and Plan   #hypertension S: controlled on last check on amlodipine 10mg  and lisinopril 40mg  and metoprolol 50mg  BP Readings from Last 3 Encounters:  09/24/18 118/78  08/27/18 138/88  05/07/18 136/84  A/P:  hopefully Stable. Continue current medications. 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  wellbutrin seems to be helping with smoking - she is down to less than 2 packs per week.  Depression screen [facility] 2/9 03/16/2019 08/27/2018 12/18/2017  Decreased Interest 0 1 2  Down, Depressed, Hopeless 0 3 2  PHQ - 2 Score 0 4 4  Altered sleeping 0 0 0  Tired, decreased energy 3 2 0  Change in appetite 3 3 1   Feeling  bad or failure about yourself  0 1 3  Trouble concentrating 0 0 0  Moving slowly or fidgety/restless 0 0 0  Suicidal thoughts 0 0 0  PHQ-9 Score 6 10 8   Difficult doing work/chores Not difficult at all - Somewhat difficult  A/P: reasonable control of depression- no anhedonia or depressed mood. Continue citalopram and wellbutrin   # cigarette smoker S:smoking under 2 packs per week- wellbutrin reduces desire but still smoking low level  A/P: encouraged complete cessation- continue well butrin for now   Other notes: 1.chronic pain and cervical spine stenosis- back and related nerve issues- injections with neurosurgery- seems like back has been better since then.  Gabapentin and baclofen still helping with nerve/back pain. Continue these medications  Advised in office visit within 6 months  Lab/Order associations: Essential hypertension  Recurrent major depressive disorder, in full remission ([place])  Cigarette smoker  Advised 6 months in person visit- patient agrees to schedule- will need blood work and updated health maintenance at that time- pap here vs gyn? Meds ordered this encounter  Medications  . amLODipine (NORVASC) 10 MG tablet    Sig: Take 1 tablet (10 mg total) by mouth daily.    Dispense:  90 tablet    Refill:  1  . baclofen (LIORESAL) 10 MG tablet    Sig: TAKE 1 TABLET BY MOUTH THREE TIMES A DAY AS NEEDED FOR MUSCLE SPASMS    Dispense:  270 tablet    Refill:  1  . buPROPion (WELLBUTRIN XL) 150 MG 24 hr tablet    Sig: Take 2 tablets (300 mg total) by mouth daily.    Dispense:  180 tablet    Refill:  1  . citalopram (CELEXA) 40 MG tablet    Sig: Take 1 tablet (40 mg total) by mouth daily.    Dispense:  90 tablet    Refill:  1  . gabapentin (NEURONTIN) 300 MG capsule    Sig: Take 3 capsules (900 mg total) by mouth 3 (three) times daily.    Dispense:  810 capsule    Refill:  1  . lisinopril (ZESTRIL) 40 MG tablet    Sig: Take 1 tablet (40 mg total) by mouth daily.     Dispense:  90 tablet    Refill:  1  . metoprolol tartrate (LOPRESSOR) 50 MG tablet    Sig: Take 1 tablet (50 mg total) by mouth 2 (two) times daily.    Dispense:  180 tablet    Refill:  1   Return precautions advised.  Garret Reddish, MD

## 2019-03-16 NOTE — Assessment & Plan Note (Signed)
S:compliant with citalopram 40mg .  wellbutrin seems to be helping with smoking - she is down to less than 2 packs per week.  Depression screen [facility] 2/9 03/16/2019 08/27/2018 12/18/2017  Decreased Interest 0 1 2  Down, Depressed, Hopeless 0 3 2  PHQ - 2 Score 0 4 4  Altered sleeping 0 0 0  Tired, decreased energy 3 2 0  Change in appetite 3 3 1   Feeling bad or failure about yourself  0 1 3  Trouble concentrating 0 0 0  Moving slowly or fidgety/restless 0 0 0  Suicidal thoughts 0 0 0  PHQ-9 Score 6 10 8   Difficult doing work/chores Not difficult at all - Somewhat difficult  A/P: reasonable control of depression- no anhedonia or depressed mood. Continue citalopram and wellbutrin

## 2019-04-04 DIAGNOSIS — R609 Edema, unspecified: Secondary | ICD-10-CM | POA: Diagnosis not present

## 2019-04-06 ENCOUNTER — Ambulatory Visit: Payer: Self-pay | Admitting: *Deleted

## 2019-04-06 NOTE — Telephone Encounter (Signed)
Pt scheduled for virtual visit on Thursday, May 28th at 2:20 w/ Dr. Yong Channel

## 2019-04-06 NOTE — Telephone Encounter (Signed)
See note

## 2019-04-06 NOTE — Telephone Encounter (Signed)
Pt reports swelling of left foot and snkle sat, seen at [facility]. States "Told it may be from my BP med and to call my PCP." Pt states swelling resolved, "Looks like my other foot, no swelling." Reports top of foot remains painful to touch "5/10, but not always painful."  Denies redness, no warmth.  Call transferred to practice, [facility], for consideration of appt. Pts Email verified. CB# [phone]  Reason for Disposition . [1] MODERATE pain (e.g., interferes with normal activities, limping) AND [2] present > 3 days  Answer Assessment - Initial Assessment Questions 1. LOCATION: "Which joint is swollen?"    Left foot and ankle 2. ONSET: "When did the swelling start?"     saturday 3. SIZE: "How large is the swelling?"     "Down a lot from Saturday, still painful on top." "Looks like other foot." 4. PAIN: "Is there any pain?" If so, ask: "How bad is it?" (Scale 1-10; or mild, moderate, severe)     5/10 with palpitation only "Not always painful" 5. CAUSE: "What do you think caused the swollen joint?"     unsure 6. OTHER SYMPTOMS: "Do you have any other symptoms?" (e.g., fever, chest pain, difficulty breathing, calf pain)     no  Protocols used: ANKLE SWELLING-A-AH

## 2019-04-08 ENCOUNTER — Encounter: Payer: Self-pay | Admitting: Family Medicine

## 2019-04-08 ENCOUNTER — Ambulatory Visit (INDEPENDENT_AMBULATORY_CARE_PROVIDER_SITE_OTHER): Payer: Medicare Other | Admitting: Family Medicine

## 2019-04-08 VITALS — BP 136/79 | HR 76 | Temp 97.6°F | Ht 64.5 in | Wt 172.0 lb

## 2019-04-08 DIAGNOSIS — M25572 Pain in left ankle and joints of left foot: Secondary | ICD-10-CM | POA: Diagnosis not present

## 2019-04-08 DIAGNOSIS — F3342 Major depressive disorder, recurrent, in full remission: Secondary | ICD-10-CM

## 2019-04-08 DIAGNOSIS — E559 Vitamin D deficiency, unspecified: Secondary | ICD-10-CM | POA: Diagnosis not present

## 2019-04-08 DIAGNOSIS — I10 Essential (primary) hypertension: Secondary | ICD-10-CM | POA: Diagnosis not present

## 2019-04-08 MED ORDER — VITAMIN D (ERGOCALCIFEROL) 1.25 MG (50000 UNIT) PO CAPS: 50000.0000 [IU] | ORAL_CAPSULE | ORAL | 0 refills | Status: DC

## 2019-04-08 NOTE — Progress Notes (Signed)
Phone [phone]   Subjective:  Virtual visit via phonenote Chief Complaint  Patient presents with  . Joint Swelling    Pt left ankle ius swollen. Sx started 4 days. Went to the ER Sunday was told to f/u with provider    This visit type was conducted due to national recommendations for restrictions regarding the COVID-19 Pandemic (e.g. social distancing).  This format is felt to be most appropriate for this patient at this time balancing risks to patient and risks to population by having him in for in person visit.  All issues noted in this document were discussed and addressed.  No physical exam was performed (except for noted visual exam or audio findings with Telehealth visits).  The patient has consented to conduct a Telehealth visit and understands insurance will be billed.   Our team/I connected with [name] at  2:20 PM EDT by phone (patient did not have equipment for webex) and verified that I am speaking with the correct person using two identifiers.  Location patient: [place] Location provider: [facility], office Persons participating in the virtual visit:  patient  Video failed unfortunately- 2nd time patient has not been able to get her video to work Time on phone: 12 minutes Counseling provided about possible causes of ankle swelling, reasons for follow up, depression  Our team/I discussed the limitations of evaluation and management by telemedicine and the availability of in person appointments. In light of current covid-19 pandemic, patient also understands that we are trying to protect them by minimizing in office contact if at all possible.  The patient expressed consent for telemedicine visit and agreed to proceed. Patient understands insurance will be billed.   ROS- No fever, chills, cough (new cough that is- occasionally gets cough at night- gerd?), shortness of breath, new body aches, sore throat, or loss of taste or smell   Past Medical History-   Patient Active Problem List   Diagnosis Date Noted  . Cervical spinal stenosis 08/27/2018    Priority: High  . Chronic pain 11/27/2017    Priority: High  . Hypertension 11/27/2017    Priority: Medium  . Major depression in full remission ([place])     Priority: Medium  . Vitamin D deficiency 04/08/2019  . Difficulty walking 01/28/2018  . Cigarette smoker 12/18/2017    Medications- reviewed and updated Current Outpatient Medications  Medication Sig Dispense Refill  . amLODipine (NORVASC) 10 MG tablet Take 1 tablet (10 mg total) by mouth daily. 90 tablet 1  . baclofen (LIORESAL) 10 MG tablet TAKE 1 TABLET BY MOUTH THREE TIMES A DAY AS NEEDED FOR MUSCLE SPASMS 270 tablet 1  . buPROPion (WELLBUTRIN XL) 150 MG 24 hr tablet Take 2 tablets (300 mg total) by mouth daily. 180 tablet 1  . citalopram (CELEXA) 40 MG tablet Take 1 tablet (40 mg total) by mouth daily. 90 tablet 1  . gabapentin (NEURONTIN) 300 MG capsule Take 3 capsules (900 mg total) by mouth 3 (three) times daily. 810 capsule 1  . lisinopril (ZESTRIL) 40 MG tablet Take 1 tablet (40 mg total) by mouth daily. 90 tablet 1  . metoprolol tartrate (LOPRESSOR) 50 MG tablet Take 1 tablet (50 mg total) by mouth 2 (two) times daily. 180 tablet 1  . Cholecalciferol (VITAMIN D3) 50000 units TABS Take 1 tablet by mouth once a week.       Objective:  BP 136/79   Pulse 76   Temp 97.6 F (36.4 C)   Ht 5'  4.5" (1.638 m)   Wt 172 lb (78 kg)   BMI 29.07 kg/m  self reported vitals  Nonlabored voice, normal speech      Assessment and Plan   # left ankle pain/swelling S:left ankle is swollen- started abotu 4 days ago. Had sharp pains in the ankle as well. Went to [facility] and was told to try to elevate and take pressure off of it. Was also advised compression stockings- has come down considerable amount- states looks normal to her now. Was having some pain but that has now resolved. Foot is not red or warm.   Before this started states she  did more walking than she normally would.   No calf swelling. No calf pain A/P: No obvious injury but did have ankle pain and swelling-may have a mild sprain.  Symptoms have resolved with compression and staying off the foot-no calf pain or swelling to suggest DVT/PE-patient will call back if has recurrence of symptoms.  #hypertension S: controlled on amldopine 10mg , metoprolol 50mg , lisinopril 40mg  BP Readings from Last 3 Encounters:  04/08/19 136/79  09/24/18 118/78  08/27/18 138/88  A/P:  Stable. Continue current medications.    # Vitamin D deficiency S:has history of low vitamin D - had been on  50k units once a week - thinks over a year ago  Doesn't get out in the sun much either.  A/P: I agreed to refill patient's vitamin D given prior low levels in fact she has been off vitamin D for a while now.  We agreed to try to check a vitamin D next visit  # Depression S: no anhedonia or depressed mood so would consider well controlled on celexa 40mg  and wellbutrin 300mg  XR. Has had lower motivation with covid 19. Does feel somewhat pent up Depression screen [facility] 2/9 03/16/2019  Decreased Interest 0  Down, Depressed, Hopeless 0  PHQ - 2 Score 0  Altered sleeping 0  Tired, decreased energy 3  Change in appetite 3  Feeling bad or failure about yourself  0  Trouble concentrating 0  Moving slowly or fidgety/restless 0  Suicidal thoughts 0  PHQ-9 Score 6  Difficult doing work/chores Not difficult at all  A/P: Reasonable control per PHQ 9 but does feel somewhat pent-up with COVID-19- given # [place] [facility]-I think some counseling could be beneficial to her.  6 months of sooner if needed- particularly if ankle issues recurs or depression worsens Lab/Order associations: Acute left ankle pain  Essential hypertension  Vitamin D deficiency  Recurrent major depressive disorder, in full remission ([place])  Meds ordered this encounter  Medications  . Vitamin D, Ergocalciferol,  (DRISDOL) 1.25 MG (50000 UT) CAPS capsule    Sig: Take 1 capsule (50,000 Units total) by mouth every 7 (seven) days.    Dispense:  12 capsule    Refill:  0    Return precautions advised.  Garret Reddish, MD

## 2019-04-08 NOTE — Patient Instructions (Addendum)
Health Maintenance Due  Topic Date Due  . TETANUS/TDAP  07/13/1974  . PAP SMEAR-Modifier  07/13/1976    Depression screen [facility] 2/9 03/16/2019 08/27/2018 12/18/2017  Decreased Interest 0 1 2  Down, Depressed, Hopeless 0 3 2  PHQ - 2 Score 0 4 4  Altered sleeping 0 0 0  Tired, decreased energy 3 2 0  Change in appetite 3 3 1   Feeling bad or failure about yourself  0 1 3  Trouble concentrating 0 0 0  Moving slowly or fidgety/restless 0 0 0  Suicidal thoughts 0 0 0  PHQ-9 Score 6 10 8   Difficult doing work/chores Not difficult at all - Somewhat difficult   Video visit

## 2019-05-04 ENCOUNTER — Telehealth: Payer: Self-pay

## 2019-05-04 DIAGNOSIS — Z124 Encounter for screening for malignant neoplasm of cervix: Secondary | ICD-10-CM

## 2019-05-04 NOTE — Telephone Encounter (Signed)
-----   Message from Marin Olp, MD sent at 04/08/2019 10:19 PM EDT ----- Can you call her and see if she would agree to GYN referral for pap smear since shes not had one in recent memory? You may place referral under cervical cancer screening if so

## 2019-05-07 NOTE — Addendum Note (Signed)
Addended by: Marian Sorrow on: 05/07/2019 02:50 PM   Modules accepted: Orders

## 2019-05-07 NOTE — Telephone Encounter (Signed)
Spoke to pt asked her if she would agree to see a GYN provider to have a pap smear done per Dr. Yong Channel due jto not have done in awhile. Pt said yes, told her okay will put referral in and someone will contact you to schedule an appt. Pt verbalized understanding. Referral placed.

## 2019-05-07 NOTE — Telephone Encounter (Signed)
Thanks Brittney for trying!

## 2019-05-10 ENCOUNTER — Ambulatory Visit (INDEPENDENT_AMBULATORY_CARE_PROVIDER_SITE_OTHER): Payer: Medicare Other | Admitting: Family Medicine

## 2019-05-10 ENCOUNTER — Encounter: Payer: Self-pay | Admitting: Family Medicine

## 2019-05-10 DIAGNOSIS — M79672 Pain in left foot: Secondary | ICD-10-CM | POA: Diagnosis not present

## 2019-05-10 NOTE — Progress Notes (Signed)
Phone [phone]   Subjective:  Virtual visit via phonenote Chief Complaint  Patient presents with  . Acute Visit    This visit type was conducted due to national recommendations for restrictions regarding the COVID-19 Pandemic (e.g. social distancing).  This format is felt to be most appropriate for this patient at this time balancing risks to patient and risks to population by having him in for in person visit.  All issues noted in this document were discussed and addressed.  No physical exam was performed (except for noted visual exam or audio findings with Telehealth visits).  The patient has consented to conduct a Telehealth visit and understands insurance will be billed.   Our team/I connected with [name] at  2:20 PM EDT by phone (patient did not have equipment for webex) and verified that I am speaking with the correct person using two identifiers.  Location patient: [place] Location provider: [facility], office Persons participating in the virtual visit:  patient  Time on phone: 14 minutes Counseling provided about  left foot/ankle concerns  Our team/I discussed the limitations of evaluation and management by telemedicine and the availability of in person appointments. In light of current covid-19 pandemic, patient also understands that we are trying to protect them by minimizing in office contact if at all possible.  The patient expressed consent for telemedicine visit and agreed to proceed. Patient understands insurance will be billed.   ROS- No fever, chills, cough, congestion, runny nose, shortness of breath, fatigue, body aches, sore throat, headache, nausea, vomiting, diarrhea, or new loss of taste or smell. No known contacts with covid 19 or someone being tested for covid 19.   Past Medical History-  Patient Active Problem List   Diagnosis Date Noted  . Cervical spinal stenosis 08/27/2018    Priority: High  . Chronic pain 11/27/2017    Priority: High   . Hypertension 11/27/2017    Priority: Medium  . Major depression in full remission ([place])     Priority: Medium  . Vitamin D deficiency 04/08/2019  . Difficulty walking 01/28/2018  . Cigarette smoker 12/18/2017    Medications- reviewed and updated Current Outpatient Medications  Medication Sig Dispense Refill  . amLODipine (NORVASC) 10 MG tablet Take 1 tablet (10 mg total) by mouth daily. 90 tablet 1  . baclofen (LIORESAL) 10 MG tablet TAKE 1 TABLET BY MOUTH THREE TIMES A DAY AS NEEDED FOR MUSCLE SPASMS 270 tablet 1  . buPROPion (WELLBUTRIN XL) 150 MG 24 hr tablet Take 2 tablets (300 mg total) by mouth daily. 180 tablet 1  . Cholecalciferol (VITAMIN D3) 50000 units TABS Take 1 tablet by mouth once a week.    . citalopram (CELEXA) 40 MG tablet Take 1 tablet (40 mg total) by mouth daily. 90 tablet 1  . gabapentin (NEURONTIN) 300 MG capsule Take 3 capsules (900 mg total) by mouth 3 (three) times daily. 810 capsule 1  . lisinopril (ZESTRIL) 40 MG tablet Take 1 tablet (40 mg total) by mouth daily. 90 tablet 1  . metoprolol tartrate (LOPRESSOR) 50 MG tablet Take 1 tablet (50 mg total) by mouth 2 (two) times daily. 180 tablet 1  . Vitamin D, Ergocalciferol, (DRISDOL) 1.25 MG (50000 UT) CAPS capsule Take 1 capsule (50,000 Units total) by mouth every 7 (seven) days. 12 capsule 0   No current facility-administered medications for this visit.      Objective:  no self reported vitals  Nonlabored voice, normal speech     Assessment  and Plan   # left foot pain/left ankle and foot swelling S:patient states she started swelling in her left ankle again early June. Pain started back a few days later- she started compression and elevation again as that helped last visit (see note 04/08/2019) but it has not helped as much. Pain is centered in the top of her left foot where last visit was more in the ankle. No redness or swelling in the foot.  No calf pain or calf swelling reported. Pain can get up to  9/10 in top of foot with sharp pains- sitting down and rubbing feet seems to help. Aleve gives some mild relief. Icy hot also gives some mild relief. Walks with walker at baseline. No falls or injury lately.  A/P: at last visit pain and swelling had largely resolved with elevation and compression- then had recurrence of symptoms which has not been responsive. Pain primarily in top of her foot. Offered coming here for x-ray but due to transportation issues would be easier to get everything done in one visit and wants to see Sports medicine for further evaluation. Referral placed today   # smoking - 6-7 cigarettes per day. Wellbutrin curbs desire some and wants to continue. Advised her to set a goal of 3 cigarettes per day starting in July.   Future Appointments  Date Time Provider [place]  05/18/2019 11:00 AM Salvadore Dom, MD [place] None   Lab/Order associations:   ICD-10-CM   1. Left foot pain  M79.672 Ambulatory referral to Sports Medicine   Return precautions advised.  Garret Reddish, MD

## 2019-05-10 NOTE — Patient Instructions (Addendum)
Health Maintenance Due  Topic Date Due  . TETANUS/TDAP  07/13/1974  . PAP SMEAR-Modifier - scheduled in July for visit 07/13/1976    Depression screen [facility] 2/9 03/16/2019 08/27/2018 12/18/2017  Decreased Interest 0 1 2  Down, Depressed, Hopeless 0 3 2  PHQ - 2 Score 0 4 4  Altered sleeping 0 0 0  Tired, decreased energy 3 2 0  Change in appetite 3 3 1   Feeling bad or failure about yourself  0 1 3  Trouble concentrating 0 0 0  Moving slowly or fidgety/restless 0 0 0  Suicidal thoughts 0 0 0  PHQ-9 Score 6 10 8   Difficult doing work/chores Not difficult at all - Somewhat difficult

## 2019-05-18 ENCOUNTER — Ambulatory Visit: Payer: Medicare Other | Admitting: Obstetrics and Gynecology

## 2019-06-01 ENCOUNTER — Ambulatory Visit: Payer: Medicare Other | Admitting: Family Medicine

## 2019-06-07 NOTE — Telephone Encounter (Signed)
Spoke with patient in regards to canceling appointment she stated it was canceled due to transportation issues. She stated she is trying to reschedule appointment for Thursday or Friday, these days better suite her transportation to the appointments.

## 2019-06-07 NOTE — Telephone Encounter (Signed)
Looks like she cancelled appointment on 05/18/2019- lets call and see if there were any issues- and if she can reschedule GYN visit

## 2019-06-10 ENCOUNTER — Encounter: Payer: Self-pay | Admitting: Certified Nurse Midwife

## 2019-06-25 ENCOUNTER — Other Ambulatory Visit: Payer: Self-pay

## 2019-06-25 ENCOUNTER — Ambulatory Visit (INDEPENDENT_AMBULATORY_CARE_PROVIDER_SITE_OTHER): Payer: Medicare Other

## 2019-06-25 DIAGNOSIS — Z0000 Encounter for general adult medical examination without abnormal findings: Secondary | ICD-10-CM | POA: Diagnosis not present

## 2019-06-25 MED ORDER — VITAMIN D (ERGOCALCIFEROL) 1.25 MG (50000 UNIT) PO CAPS: 50000.0000 [IU] | ORAL_CAPSULE | ORAL | 0 refills | Status: DC

## 2019-06-25 NOTE — Progress Notes (Signed)
This visit is being conducted via phone call due to the COVID-19 pandemic. This patient has given me verbal consent via phone to conduct this visit, patient states they are participating from their home address. Some vital signs may be absent or patient reported.   Patient identification: identified by name, DOB, and current address.    Subjective:   Brianna Saunders is a 63 y.o. female who presents for Medicare Annual (Subsequent) preventive examination.  Review of Systems:   Cardiac Risk Factors include: smoking/ tobacco exposure;hypertension     Objective:     Vitals: There were no vitals taken for this visit.  There is no height or weight on file to calculate BMI.  Advanced Directives 02/05/2018 12/18/2017  Does Patient Have a Medical Advance Directive? Yes Yes  Does patient want to make changes to medical advance directive? No - Patient declined -    Tobacco Social History   Tobacco Use  Smoking Status Current Every Day Smoker  . Packs/day: 0.50  . Years: 45.00  . Pack years: 22.50  . Types: Cigarettes  . Start date: 11/11/1969  Smokeless Tobacco Never Used  Tobacco Comment   2 cigarettes a day     Ready to quit: Not Answered Counseling given: Not Answered Comment: 2 cigarettes a day   Clinical Intake:  Pre-visit preparation completed: Yes  Pain : 0-10 Pain Score: 2  Pain Type: Chronic pain Pain Location: Back  Diabetes: No  How often do you need to have someone help you when you read instructions, pamphlets, or other written materials from your doctor or pharmacy?: 1 - Never  Interpreter Needed?: No  Information entered by :: Denman George LPN  Past Medical History:  Diagnosis Date  . Chicken pox   . Hypertension    Past Surgical History:  Procedure Laterality Date  . FRACTURE SURGERY    . PARTIAL HYSTERECTOMY    . SPINE SURGERY     lumbar spine- around 2014  . upper back surgery     states plate in neck and into right shoulder  blade reportedly- around 2014   Family History  Problem Relation Age of Onset  . Hypertension Mother   . Heart attack Mother        33  . Hypertension Father   . Kidney disease Father        dialysis  . Kidney disease Brother        dialysis  . Heart attack Maternal Grandmother   . Early death Sister        died at 74 months   Social History   Socioeconomic History  . Marital status: Single    Spouse name: Not on file  . Number of children: Not on file  . Years of education: Not on file  . Highest education level: Not on file  Occupational History  . Not on file  Social Needs  . Financial resource strain: Not on file  . Food insecurity    Worry: Not on file    Inability: Not on file  . Transportation needs    Medical: Not on file    Non-medical: Not on file  Tobacco Use  . Smoking status: Current Every Day Smoker    Packs/day: 0.50    Years: 45.00    Pack years: 22.50    Types: Cigarettes    Start date: 11/11/1969  . Smokeless tobacco: Never Used  . Tobacco comment: 2 cigarettes a day  Substance and Sexual  Activity  . Alcohol use: No    Frequency: Never  . Drug use: No  . Sexual activity: Never  Lifestyle  . Physical activity    Days per week: Not on file    Minutes per session: Not on file  . Stress: Not on file  Relationships  . Social Herbalist on phone: Not on file    Gets together: Not on file    Attends religious service: Not on file    Active member of club or organization: Not on file    Attends meetings of clubs or organizations: Not on file    Relationship status: Not on file  Other Topics Concern  . Not on file  Social History Narrative   2LIves with her son and granddaughter- now 30 (often with patient). Also has 1 cat.    Moved to [place] from [place]- slightly closer to family.    Does cleaning/cooking      Disabled due to worsening back/surgeries. 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 Wear and tear from job.     12th grade education      Hobbies: solitaire on laptop, enjoys doing puzzles on the computer    Outpatient Encounter Medications as of 06/25/2019  Medication Sig  . amLODipine (NORVASC) 10 MG tablet Take 1 tablet (10 mg total) by mouth daily.  . baclofen (LIORESAL) 10 MG tablet TAKE 1 TABLET BY MOUTH THREE TIMES A DAY AS NEEDED FOR MUSCLE SPASMS  . buPROPion (WELLBUTRIN XL) 150 MG 24 hr tablet Take 2 tablets (300 mg total) by mouth daily.  . Cholecalciferol (VITAMIN D3) 50000 units TABS Take 1 tablet by mouth once a week.  . citalopram (CELEXA) 40 MG tablet Take 1 tablet (40 mg total) by mouth daily.  Marland Kitchen gabapentin (NEURONTIN) 300 MG capsule Take 3 capsules (900 mg total) by mouth 3 (three) times daily.  Marland Kitchen lisinopril (ZESTRIL) 40 MG tablet Take 1 tablet (40 mg total) by mouth daily.  . metoprolol tartrate (LOPRESSOR) 50 MG tablet Take 1 tablet (50 mg total) by mouth 2 (two) times daily.  . Vitamin D, Ergocalciferol, (DRISDOL) 1.25 MG (50000 UT) CAPS capsule Take 1 capsule (50,000 Units total) by mouth every 7 (seven) days.   No facility-administered encounter medications on file as of 06/25/2019.     Activities of Daily Living In your present state of health, do you have any difficulty performing the following activities: 06/25/2019  Hearing? N  Vision? N  Difficulty concentrating or making decisions? N  Walking or climbing stairs? N  Dressing or bathing? N  Doing errands, shopping? N  Preparing Food and eating ? N  Using the Toilet? N  In the past six months, have you accidently leaked urine? N  Do you have problems with loss of bowel control? N  Managing your Medications? N  Managing your Finances? N  Housekeeping or managing your Housekeeping? N  Some recent data might be hidden    Patient Care Team: Marin Olp, MD as PCP - General (Family Medicine)    Assessment:   This is a routine wellness examination for [place].  Exercise Activities and Dietary  recommendations Current Exercise Habits: The patient does not participate in regular exercise at present, Exercise limited by: orthopedic condition(s)  Goals    . Patient Stated     Get out more more; go to a park and relax.       Fall Risk Fall Risk  11/27/2017  Falls in  the past year? Yes  Number falls in past yr: 2 or more  Injury with Fall? No  Risk Factor Category  High Fall Risk  Risk for fall due to : Impaired balance/gait    Depression Screen PHQ 2/9 Scores 03/16/2019 08/27/2018 12/18/2017 11/27/2017  PHQ - 2 Score 0 4 4 1   PHQ- 9 Score 6 10 8  -     Immunization History  Administered Date(s) Administered  . Pneumococcal Polysaccharide-23 04/02/2017    Qualifies for Shingles Vaccine? Discussed and patient will check with pharmacy for coverage.  Patient education handout provided    Screening Tests Health Maintenance  Topic Date Due  . TETANUS/TDAP  07/13/1974  . PAP SMEAR-Modifier  07/13/1976  . INFLUENZA VACCINE  06/12/2019  . MAMMOGRAM  10/14/2020  . Fecal DNA (Cologuard)  07/29/2021  . Hepatitis C Screening  Completed  . HIV Screening  Completed    Cancer Screenings: Breast:  Up to date on Mammogram? Yes; last 10/14/18    Colorectal: Cologuard information provided       Plan:    I have personally reviewed and addressed the Medicare Annual Wellness questionnaire and have noted the following in the patient's chart:  A. Medical and social history B. Use of alcohol, tobacco or illicit drugs  C. Current medications and supplements D. Functional ability and status E.  Nutritional status F.  Physical activity G. Advance directives H. List of other physicians I.  Hospitalizations, surgeries, and ER visits in previous 12 months J.  [place] such as hearing and vision if needed, cognitive and depression L. Referrals, records requested, and appointments- none   In addition, I have reviewed and discussed with patient certain preventive protocols,  quality metrics, and best practice recommendations. A written personalized care plan for preventive services as well as general preventive health recommendations were provided to patient.   Signed,  Denman George, LPN  Nurse Health Advisor   Nurse Notes:  Patient asking for a refill of Vitamin D 50000

## 2019-06-25 NOTE — Progress Notes (Signed)
I have reviewed and agree with note, evaluation, plan.   I refilled vitamin D.   Will ask courtney for plan on flu shot for this season for patient as well as pap smear- see referral note 05/07/2019.   Garret Reddish, MD

## 2019-06-25 NOTE — Patient Instructions (Signed)
Ms. Caskey , Thank you for taking time to come for your Medicare Wellness Visit. I appreciate your ongoing commitment to your health goals. Please review the following plan we discussed and let me know if I can assist you in the future.   Screening recommendations/referrals: Colorectal Screening: Information provided on Cologuard  Mammogram: completed 10/14/18  Vision and Dental Exams: Recommended annual ophthalmology exams for early detection of glaucoma and other disorders of the eye Recommended annual dental exams for proper oral hygiene  Vaccinations: Influenza vaccine:  recommended this fall either at PCP office or through your local pharmacy Pneumococcal vaccine: completed 04/02/17; next after age 4 Tdap vaccine: Please call your insurance company to determine your out of pocket expense. You may also receive this vaccine at your local pharmacy or Health Dept. Shingles vaccine: Please call your insurance company to determine your out of pocket expense for the Shingrix vaccine. You may receive this vaccine at your local pharmacy.  Advanced directives: Please bring a copy of your POA (Power of Attorney) and/or Living Will to your next appointment.  Goals: Recommend to continue efforts to reduce smoking habits until no longer smoking. Smoking Cessation literature is attached below.  Next appointment: Please schedule your Annual Wellness Visit with your Nurse Health Advisor in one year.  Preventive Care 40-64 Years, Female Preventive care refers to lifestyle choices and visits with your health care provider that can promote health and wellness. What does preventive care include?  A yearly physical exam. This is also called an annual well check.  Dental exams once or twice a year.  Routine eye exams. Ask your health care provider how often you should have your eyes checked.  Personal lifestyle choices, including:  Daily care of your teeth and gums.  Regular physical activity.   Eating a healthy diet.  Avoiding tobacco and drug use.  Limiting alcohol use.  Practicing safe sex.  Taking low-dose aspirin daily starting at age 42 if recommended by your health care provider.  Taking vitamin and mineral supplements as recommended by your health care provider. What happens during an annual well check? The services and screenings done by your health care provider during your annual well check will depend on your age, overall health, lifestyle risk factors, and family history of disease. Counseling  Your health care provider may ask you questions about your:  Alcohol use.  Tobacco use.  Drug use.  Emotional well-being.  Home and relationship well-being.  Sexual activity.  Eating habits.  Work and work Statistician.  Method of birth control.  Menstrual cycle.  Pregnancy history. Screening  You may have the following tests or measurements:  Height, weight, and BMI.  Blood pressure.  Lipid and cholesterol levels. These may be checked every 5 years, or more frequently if you are over 15 years old.  Skin check.  Lung cancer screening. You may have this screening every year starting at age 16 if you have a 30-pack-year history of smoking and currently smoke or have quit within the past 15 years.  Fecal occult blood test (FOBT) of the stool. You may have this test every year starting at age 8.  Flexible sigmoidoscopy or colonoscopy. You may have a sigmoidoscopy every 5 years or a colonoscopy every 10 years starting at age 61.  Hepatitis C blood test.  Hepatitis B blood test.  Sexually transmitted disease (STD) testing.  Diabetes screening. This is done by checking your blood sugar (glucose) after you have not eaten for a while (fasting).  You may have this done every 1-3 years.  Mammogram. This may be done every 1-2 years. Talk to your health care provider about when you should start having regular mammograms. This may depend on whether you have  a family history of breast cancer.  BRCA-related cancer screening. This may be done if you have a family history of breast, ovarian, tubal, or peritoneal cancers.  Pelvic exam and Pap test. This may be done every 3 years starting at age 33. Starting at age 99, this may be done every 5 years if you have a Pap test in combination with an HPV test.  Bone density scan. This is done to screen for osteoporosis. You may have this scan if you are at high risk for osteoporosis. Discuss your test results, treatment options, and if necessary, the need for more tests with your health care provider. Vaccines  Your health care provider may recommend certain vaccines, such as:  Influenza vaccine. This is recommended every year.  Tetanus, diphtheria, and acellular pertussis (Tdap, Td) vaccine. You may need a Td booster every 10 years.  Zoster vaccine. You may need this after age 92.  Pneumococcal 13-valent conjugate (PCV13) vaccine. You may need this if you have certain conditions and were not previously vaccinated.  Pneumococcal polysaccharide (PPSV23) vaccine. You may need one or two doses if you smoke cigarettes or if you have certain conditions. Talk to your health care provider about which screenings and vaccines you need and how often you need them. This information is not intended to replace advice given to you by your health care provider. Make sure you discuss any questions you have with your health care provider. Document Released: 11/24/2015 Document Revised: 07/17/2016 Document Reviewed: 08/29/2015 Elsevier Interactive Patient Education  2017 [place] Prevention in the Home Falls can cause injuries. They can happen to people of all ages. There are many things you can do to make your home safe and to help prevent falls. What can I do on the outside of my home?  Regularly fix the edges of walkways and driveways and fix any cracks.  Remove anything that might make you trip as  you walk through a door, such as a raised step or threshold.  Trim any bushes or trees on the path to your home.  Use bright outdoor lighting.  Clear any walking paths of anything that might make someone trip, such as rocks or tools.  Regularly check to see if handrails are loose or broken. Make sure that both sides of any steps have handrails.  Any raised decks and porches should have guardrails on the edges.  Have any leaves, snow, or ice cleared regularly.  Use sand or salt on walking paths during winter.  Clean up any spills in your garage right away. This includes oil or grease spills. What can I do in the bathroom?  Use night lights.  Install grab bars by the toilet and in the tub and shower. Do not use towel bars as grab bars.  Use non-skid mats or decals in the tub or shower.  If you need to sit down in the shower, use a plastic, non-slip stool.  Keep the floor dry. Clean up any water that spills on the floor as soon as it happens.  Remove soap buildup in the tub or shower regularly.  Attach bath mats securely with double-sided non-slip rug tape.  Do not have throw rugs and other things on the floor that can make  you trip. What can I do in the bedroom?  Use night lights.  Make sure that you have a light by your bed that is easy to reach.  Do not use any sheets or blankets that are too big for your bed. They should not hang down onto the floor.  Have a firm chair that has side arms. You can use this for support while you get dressed.  Do not have throw rugs and other things on the floor that can make you trip. What can I do in the kitchen?  Clean up any spills right away.  Avoid walking on wet floors.  Keep items that you use a lot in easy-to-reach places.  If you need to reach something above you, use a strong step stool that has a grab bar.  Keep electrical cords out of the way.  Do not use floor polish or wax that makes floors slippery. If you must  use wax, use non-skid floor wax.  Do not have throw rugs and other things on the floor that can make you trip. What can I do with my stairs?  Do not leave any items on the stairs.  Make sure that there are handrails on both sides of the stairs and use them. Fix handrails that are broken or loose. Make sure that handrails are as long as the stairways.  Check any carpeting to make sure that it is firmly attached to the stairs. Fix any carpet that is loose or worn.  Avoid having throw rugs at the top or bottom of the stairs. If you do have throw rugs, attach them to the floor with carpet tape.  Make sure that you have a light switch at the top of the stairs and the bottom of the stairs. If you do not have them, ask someone to add them for you. What else can I do to help prevent falls?  Wear shoes that:  Do not have high heels.  Have rubber bottoms.  Are comfortable and fit you well.  Are closed at the toe. Do not wear sandals.  If you use a stepladder:  Make sure that it is fully opened. Do not climb a closed stepladder.  Make sure that both sides of the stepladder are locked into place.  Ask someone to hold it for you, if possible.  Clearly mark and make sure that you can see:  Any grab bars or handrails.  First and last steps.  Where the edge of each step is.  Use tools that help you move around (mobility aids) if they are needed. These include:  Canes.  Walkers.  Scooters.  Crutches.  Turn on the lights when you go into a dark area. Replace any light bulbs as soon as they burn out.  Set up your furniture so you have a clear path. Avoid moving your furniture around.  If any of your floors are uneven, fix them.  If there are any pets around you, be aware of where they are.  Review your medicines with your doctor. Some medicines can make you feel dizzy. This can increase your chance of falling. Ask your doctor what other things that you can do to help prevent  falls. This information is not intended to replace advice given to you by your health care provider. Make sure you discuss any questions you have with your health care provider. Document Released: 08/24/2009 Document Revised: 04/04/2016 Document Reviewed: 12/02/2014 Elsevier Interactive Patient Education  2017 [employer].

## 2019-06-25 NOTE — Addendum Note (Signed)
Addended by: Marin Olp on: 06/25/2019 05:56 PM   Modules accepted: Orders

## 2019-06-30 NOTE — Progress Notes (Signed)
Noted  

## 2019-07-01 ENCOUNTER — Ambulatory Visit: Payer: Medicare Other | Admitting: Family Medicine

## 2019-07-01 DIAGNOSIS — M48061 Spinal stenosis, lumbar region without neurogenic claudication: Secondary | ICD-10-CM | POA: Diagnosis not present

## 2019-07-09 ENCOUNTER — Ambulatory Visit: Payer: Self-pay

## 2019-07-09 ENCOUNTER — Encounter: Payer: Self-pay | Admitting: Family Medicine

## 2019-07-09 ENCOUNTER — Ambulatory Visit (INDEPENDENT_AMBULATORY_CARE_PROVIDER_SITE_OTHER): Payer: Medicare Other | Admitting: Family Medicine

## 2019-07-09 ENCOUNTER — Ambulatory Visit: Payer: Medicare Other | Admitting: Family Medicine

## 2019-07-09 ENCOUNTER — Other Ambulatory Visit: Payer: Self-pay

## 2019-07-09 VITALS — BP 118/72 | HR 108 | Ht 64.5 in | Wt 163.0 lb

## 2019-07-09 DIAGNOSIS — M21372 Foot drop, left foot: Secondary | ICD-10-CM | POA: Diagnosis not present

## 2019-07-09 DIAGNOSIS — M79672 Pain in left foot: Secondary | ICD-10-CM

## 2019-07-09 NOTE — Assessment & Plan Note (Signed)
Moderate to severe left foot drop.  Patient does have significant weakness.  Seems to be more secondary to the pain coming from the back than truly her foot.  We discussed the peripheral neuropathy being a significant problem.  Patient had been taking large doses of gabapentin at 900 mg 3 times a day.  Given a trial of a different medication that I am hoping will be more beneficial.  Patient is going to do the vitamin D still.  We discussed with patient about changing a heel lift into the toe to help her with the foot drop and starting with physical therapy for strength and conditioning.  I am concerned and further evaluation of patient's neurologic aspect for the peripheral neuropathy or more centrally located secondary to the back with an EMG.  Follow-up with me again after these tests are done and see how patient responds.

## 2019-07-09 NOTE — Patient Instructions (Addendum)
Good to see you.  Ice 20 minutes 2 times daily. Usually after activity and before bed. Heel lift 1/8inch  [facility] Total Support PT [place] Stop gabapentin try Horizant 1 pill at night Nerve conduction study-neurology will call you See me in 5-6 weeks

## 2019-07-09 NOTE — Progress Notes (Signed)
[facility] [address], [place] [address] Phone: [phone] Subjective:   [name], am serving as a scribe for Dr. Hulan Saas.  I'm seeing this patient by the request  of:    CC: Left foot pain  [id number]  Brianna Saunders is a 63 y.o. female coming in with complaint of left foot pain. Pain over top of foot. Pain occurring for around 1 year. Pain increased the past 2 months with swelling increasing. Pain is sharp. Wakes patient up at night. 6/10 pain in office. Weight bearing increases her pain. Patient is having worsening numbness in her foot. Massaging area helps. Does not use any medications for pain. Antalgic gait.  Reviewing patient's chart and has had a significant back pain for some time.  MRI of the lumbar spine was taken on November 17, 2018. Patient was found to have an L2-L3 broad-based disc bulge as well as at L3-L4 with mild to moderate spinal stenosis.  Patient has been seen another provider and given injections in the back recently.  Past Medical History:  Diagnosis Date  . Chicken pox   . Hypertension    Past Surgical History:  Procedure Laterality Date  . FRACTURE SURGERY    . PARTIAL HYSTERECTOMY    . SPINE SURGERY     lumbar spine- around 2014  . upper back surgery     states plate in neck and into right shoulder blade reportedly- around 2014   Social History   Socioeconomic History  . Marital status: Single    Spouse name: Not on file  . Number of children: Not on file  . Years of education: Not on file  . Highest education level: Not on file  Occupational History  . Not on file  Social Needs  . Financial resource strain: Not on file  . Food insecurity    Worry: Not on file    Inability: Not on file  . Transportation needs    Medical: Not on file    Non-medical: Not on file  Tobacco Use  . Smoking status: Current Every Day Smoker    Packs/day: 0.50    Years: 45.00    Pack years:  22.50    Types: Cigarettes    Start date: 11/11/1969  . Smokeless tobacco: Never Used  . Tobacco comment: 2 cigarettes a day  Substance and Sexual Activity  . Alcohol use: No    Frequency: Never  . Drug use: No  . Sexual activity: Never  Lifestyle  . Physical activity    Days per week: Not on file    Minutes per session: Not on file  . Stress: Not on file  Relationships  . Social Herbalist on phone: Not on file    Gets together: Not on file    Attends religious service: Not on file    Active member of club or organization: Not on file    Attends meetings of clubs or organizations: Not on file    Relationship status: Not on file  Other Topics Concern  . Not on file  Social History Narrative   2LIves with her son and granddaughter- now 76 (often with patient). Also has 1 cat.    Moved to [place] from [place]- slightly closer to family.    Does cleaning/cooking      Disabled due to worsening back/surgeries. 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 Wear and tear from job.    12th  grade education      Hobbies: solitaire on laptop, enjoys doing puzzles on the computer   No Known Allergies Family History  Problem Relation Age of Onset  . Hypertension Mother   . Heart attack Mother        47  . Hypertension Father   . Kidney disease Father        dialysis  . Kidney disease Brother        dialysis  . Heart attack Maternal Grandmother   . Early death Sister        died at 57 months     Current Outpatient Medications (Cardiovascular):  .  amLODipine (NORVASC) 10 MG tablet, Take 1 tablet (10 mg total) by mouth daily. Marland Kitchen  lisinopril (ZESTRIL) 40 MG tablet, Take 1 tablet (40 mg total) by mouth daily. .  metoprolol tartrate (LOPRESSOR) 50 MG tablet, Take 1 tablet (50 mg total) by mouth 2 (two) times daily.     Current Outpatient Medications (Other):  .  baclofen (LIORESAL) 10 MG tablet, TAKE 1 TABLET BY MOUTH THREE TIMES A DAY AS NEEDED FOR MUSCLE  SPASMS .  buPROPion (WELLBUTRIN XL) 150 MG 24 hr tablet, Take 2 tablets (300 mg total) by mouth daily. .  Cholecalciferol (VITAMIN D3) 50000 units TABS, Take 1 tablet by mouth once a week. .  citalopram (CELEXA) 40 MG tablet, Take 1 tablet (40 mg total) by mouth daily. Marland Kitchen  gabapentin (NEURONTIN) 300 MG capsule, Take 3 capsules (900 mg total) by mouth 3 (three) times daily. .  Vitamin D, Ergocalciferol, (DRISDOL) 1.25 MG (50000 UT) CAPS capsule, Take 1 capsule (50,000 Units total) by mouth every 7 (seven) days.    Past medical history, social, surgical and family history all reviewed in electronic medical record.  No pertanent information unless stated regarding to the chief complaint.   Review of Systems:  No headache, visual changes, nausea, vomiting, diarrhea, constipation, dizziness, abdominal pain, skin rash, fevers, chills, night sweats, weight loss, swollen lymph nodes, body aches, joint swelling, muscle aches, chest pain, shortness of breath, mood changes.   Objective  Blood pressure 118/72, pulse (!) 108, height 5' 4.5" (1.638 m), weight 163 lb (73.9 kg), SpO2 98 %. Systems examined below as of    General: No apparent distress alert and oriented x3 mood and affect normal, dressed appropriately.  HEENT: Pupils equal, extraocular movements intact  Respiratory: Patient's speak in full sentences and does not appear short of breath  Cardiovascular: No lower extremity edema, non tender, no erythema  Skin: Warm dry intact with no signs of infection or rash on extremities or on axial skeleton.  Abdomen: Soft nontender  Neuro: Cranial nerves II through XII are intact, patient has significant peripheral neuropathy of the feet bilaterally Lymph: No lymphadenopathy of posterior or anterior cervical chain or axillae bilaterally.  Gait patient is using a rolling walker MSK:  tender with limited range of motion and good stability and symmetric strength and tone of shoulders, elbows, wrist,  weakness of the lower extremities bilaterally  Left foot exam shows the patient does have a subluxation of the ankle noted.  Patient does have rigid midfoot.  Tender to palpation minorly over the dorsal aspect.  Patient has significant peripheral neuropathy noted again.  Foot drop noted with 3 out of 5 strength of the left foot against dorsiflexion.  Deep tendon reflexes are decreased as well.  Limited musculoskeletal ultrasound was performed and interpreted by .Lyndal Pulley  Limited ultrasound of patient's foot  shows some moderate arthritic changes of the midfoot and some osteopenic-like changes but no true occult fracture noted.   Impression and Recommendations:     This case required medical decision making of moderate complexity. The above documentation has been reviewed and is accurate and complete Lyndal Pulley, DO       Note: This dictation was prepared with Dragon dictation along with smaller phrase technology. Any transcriptional errors that result from this process are unintentional.

## 2019-07-16 ENCOUNTER — Ambulatory Visit: Payer: Medicare Other | Admitting: Certified Nurse Midwife

## 2019-07-21 ENCOUNTER — Other Ambulatory Visit: Payer: Self-pay

## 2019-07-21 ENCOUNTER — Ambulatory Visit: Payer: Medicare Other | Attending: Family Medicine | Admitting: Physical Therapy

## 2019-07-21 DIAGNOSIS — R2689 Other abnormalities of gait and mobility: Secondary | ICD-10-CM

## 2019-07-21 DIAGNOSIS — M6281 Muscle weakness (generalized): Secondary | ICD-10-CM | POA: Diagnosis not present

## 2019-07-21 NOTE — Patient Instructions (Signed)
Access Code: [email or web address]  URL: [email or web address]  Date: 07/21/2019  Prepared by: Madelyn Flavors   Exercises Long Sitting Calf Stretch with Strap - 3 reps - 1 sets - 30 sec hold - 2x daily - 7x weekly Seated Heel Raise - 10 reps - 3 sets - 1x daily - 7x weekly Supine Ankle Inversion Eversion AROM - 10 reps - 3 sets - 1x daily - 7x weekly Long Sitting Ankle Pumps - 10 reps - 3 sets - 1x daily - 7x weekly

## 2019-07-21 NOTE — Therapy (Addendum)
[name], [name], [address] Phone: [phone]   Fax:  [phone]  Physical Therapy Evaluation  PHYSICAL THERAPY DISCHARGE SUMMARY  Visits from Start of Care: 1  Current functional level related to goals / functional outcomes: Unable to assess due to unplanned discharge  Remaining deficits: Unable to assess due to unplanned discharge  Education / Equipment: Unable to assess due to unplanned discharge  Patient agrees to discharge. Patient goals were not met. Patient is being discharged due to not returning since the last visit.  Signing therapist has never treated or seen patient, but is discharging patient on behalf of last treating therapist secondary to long standing open episode that needs closing. 2:56 PM, 11/22/21 [name], DPT Physical Therapy with [facility]  [phone] office     Patient Details  Name: Brianna Saunders MRN: [id number] Date of Birth: [birth year] Referring Provider (PT): Hulan Saas   Encounter Date: 07/21/2019  PT End of Session - 07/21/19 1436     Visit Number  1    PT Start Time  5374    PT Stop Time  8270    PT Time Calculation (min)  41 min    Activity Tolerance  Patient tolerated treatment well    Behavior During Therapy  [facility] for tasks assessed/performed        Past Medical History:  Diagnosis Date   Chicken pox    Hypertension     Past Surgical History:  Procedure Laterality Date   FRACTURE SURGERY     PARTIAL HYSTERECTOMY     SPINE SURGERY     lumbar spine- around 2014   upper back surgery     states plate in neck and into right shoulder blade reportedly- around 2014    There were no vitals filed for this visit.   Subjective Assessment - 07/21/19 1438     Subjective  Patient had a back injury about 10 years ago resulting in foot drop. The foot drop is the same but her pain was worse until given  new meds. The pain has improved but she is still weak.    Patient is accompained by:  Family member   daughter   Pertinent History  back surgery, neck surgery, neuropathy, HTN, arthritis, anxiety    Patient Stated Goals  to get stronger in BLE and increase endurance for exercise    Currently in Pain?  No/denies          [facility] PT Assessment - 07/21/19 0001       Assessment   Medical Diagnosis  left foot drop    Referring Provider (PT)  Hulan Saas    Onset Date/Surgical Date  01/10/19    Prior Therapy  10 years ago      Precautions   Precautions  Other (comment)    Precaution Comments  Foot drop      Balance Screen   Has the patient fallen in the past 6 months  No    Has the patient had a decrease in activity level because of a fear of falling?   No    Is the patient reluctant to leave their home because of a fear of falling?   No      Home Environment   Living Environment  Private residence    Living Arrangements  Other relatives    Type of Home  Apartment    Home Access  Stairs to enter  Entrance Stairs-Number of Steps  15    Entrance Stairs-Rails  Can reach both    Home Layout  One level    [place] - 4 wheels;Walker - 2 wheels;Kasandra Knudsen - single point      Prior Function   Vocation  Retired    Leisure  exercise      [employer] Comments  left lumbar scoliosis due to muscle tightness      ROM / Strength   AROM / PROM / Strength  AROM;Strength      AROM   Overall AROM Comments  -30/5: ever 20/40      Strength   Strength Assessment Site  Hip;Knee;Ankle    Right/Left Hip  Right;Left    Right Hip Flexion  4/5    Right Hip Extension  4+/5    Right Hip ABduction  4+/5    Left Hip Flexion  4-/5    Left Hip Extension  4+/5    Left Hip ABduction  4+/5    Right/Left Knee  Right;Left    Right Knee Flexion  4-/5    Right Knee Extension  5/5    Left Knee Flexion  3+/5    Left Knee Extension  4+/5    Right/Left Ankle  --   bil  ankles 5/5 except bil PF, Left DF, ever 3+/5, inv 4/5     Palpation   Palpation comment  unremarkable      Transfers   Comments  unable to perform sit<>stand without CGA but could stand with supervision      Ambulation/Gait   Ambulation/Gait  Yes    Ambulation/Gait Assistance  6: Modified independent (Device/Increase time)    Ambulation Distance (Feet)  30 Feet    Assistive device  4-wheeled walker    Gait Pattern  Decreased hip/knee flexion - left;Decreased dorsiflexion - left;Left hip hike;Scissoring    Ambulation Surface  Level                 Objective measurements completed on examination: See above findings.              PT Education - 07/21/19 1634     Education Details  HEP    Person(s) Educated  Patient;Child(ren)    Methods  Explanation;Demonstration;Handout    Comprehension  Verbalized understanding;Returned demonstration        PT Short Term Goals - 07/21/19 1642       PT SHORT TERM GOAL #1   Title  Ind with inital HEP    Time  4    Period  Weeks    Status  New      PT SHORT TERM GOAL #2   Title  ---------------         PT Long Term Goals - 07/21/19 1642       PT LONG TERM GOAL #1   Title  Ind with advanced HEP for strength and flexibility    Period  Weeks    Status  New    Target Date  09/15/19      PT LONG TERM GOAL #2   Title  Improved left ankle DF to 5 degrees to normalize gait.    Time  8    Period  Weeks    Status  New      PT LONG TERM GOAL #3   Title  Improved LE strength to 4+-5/5 to improve function.    Time  8    Period  Weeks    Status  New    Target Date  09/15/19      PT LONG TERM GOAL #4   Title  Patient able to perform 5x sit to stand in 10 sec or less to reduce fall risk.    Time  8    Period  Weeks    Status  New      PT LONG TERM GOAL #5   Title  -----------              Plan - 07/21/19 1635     Clinical Impression Statement  Patient presents using rollator walker with gait  deviations including decreased hip flex, knee flex and and ankle DF on right. She has decreased strength in bil hips, knees and ankles as well as flexibility deficits affecting posture.    Personal Factors and Comorbidities  Comorbidity 3+    Comorbidities  back surgery, neck surgery, neuropathy bil feet, HTN, arthritis, anxiety    Examination-Activity Limitations  Locomotion Level;Stand;Stairs    Examination-Participation Restrictions  Community Activity;Other   exercise   Stability/Clinical Decision Making  Evolving/Moderate complexity    Clinical Decision Making  Moderate    Rehab Potential  Good    PT Frequency  2x / week    PT Duration  8 weeks    PT Treatment/Interventions  ADLs/Self Care Home Management;Electrical Stimulation;Gait training;Therapeutic exercise;Balance training;Patient/family education;Manual techniques;Taping    PT Next Visit Plan  Review HEP and progress. Ankle, knee and hip strengthening to normalize gait; flexibilty    PT Home Exercise Plan  [email or web address]    Consulted and Agree with Plan of Care  Patient        Patient will benefit from skilled therapeutic intervention in order to improve the following deficits and impairments:  Abnormal gait, Decreased range of motion, Decreased endurance, Decreased activity tolerance, Decreased balance, Impaired flexibility, Decreased strength, Postural dysfunction  Visit Diagnosis: Other abnormalities of gait and mobility - Plan: PT plan of care cert/re-cert  Muscle weakness (generalized) - Plan: PT plan of care cert/re-cert     Problem List Patient Active Problem List   Diagnosis Date Noted   Left foot drop 07/09/2019   Vitamin D deficiency 04/08/2019   Cervical spinal stenosis 08/27/2018   Difficulty walking 01/28/2018   Cigarette smoker 12/18/2017   Hypertension 11/27/2017   Chronic pain 11/27/2017   Major depression in full remission ([place])     Madelyn Flavors PT 07/21/2019, 4:52 PM  [facility] [address] [place] [address], [name], [address] Phone: [phone]   Fax:  [phone]  Name: Ladonne Sharples MRN: [id number] Date of Birth: December 07, 1955

## 2019-07-23 ENCOUNTER — Ambulatory Visit: Payer: Medicare Other | Admitting: Certified Nurse Midwife

## 2019-07-23 ENCOUNTER — Telehealth: Payer: Self-pay

## 2019-07-23 ENCOUNTER — Encounter: Payer: Self-pay | Admitting: Neurology

## 2019-07-23 ENCOUNTER — Telehealth: Payer: Self-pay | Admitting: Certified Nurse Midwife

## 2019-07-23 NOTE — Telephone Encounter (Signed)
Error
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
## 2019-07-23 NOTE — Telephone Encounter (Signed)
Spoke with patient and provided her with [facility]'s phone number to schedule EMG. Per a verbal from Dr. Tamala Julian patient is to not take Horizant if it is causing stiffness. Patient voices understanding.

## 2019-07-26 ENCOUNTER — Other Ambulatory Visit: Payer: Self-pay

## 2019-07-26 DIAGNOSIS — M79672 Pain in left foot: Secondary | ICD-10-CM

## 2019-07-27 ENCOUNTER — Encounter: Payer: Medicare Other | Admitting: Physical Therapy

## 2019-08-05 ENCOUNTER — Ambulatory Visit: Payer: Medicare Other | Admitting: Physical Therapy

## 2019-08-06 ENCOUNTER — Ambulatory Visit: Payer: Medicare Other | Admitting: Family Medicine

## 2019-08-06 DIAGNOSIS — Z0289 Encounter for other administrative examinations: Secondary | ICD-10-CM

## 2019-08-06 NOTE — Progress Notes (Deleted)
[facility] [address], [place] [address] Phone: [phone] Subjective:    I'm seeing this patient by the request  of:    CC:   [id number]   07/09/2019 Moderate to severe left foot drop.  Patient does have significant weakness.  Seems to be more secondary to the pain coming from the back than truly her foot.  We discussed the peripheral neuropathy being a significant problem.  Patient had been taking large doses of gabapentin at 900 mg 3 times a day.  Given a trial of a different medication that I am hoping will be more beneficial.  Patient is going to do the vitamin D still.  We discussed with patient about changing a heel lift into the toe to help her with the foot drop and starting with physical therapy for strength and conditioning.  I am concerned and further evaluation of patient's neurologic aspect for the peripheral neuropathy or more centrally located secondary to the back with an EMG.  Follow-up with me again after these tests are done and see how patient responds.  Update 08/06/2019 Brianna Saunders is a 63 y.o. female coming in with complaint of ***  Onset-  Location Duration-  Character- Aggravating factors- Reliving factors-  Therapies tried-  Severity-     Past Medical History:  Diagnosis Date  . Chicken pox   . Hypertension    Past Surgical History:  Procedure Laterality Date  . FRACTURE SURGERY    . PARTIAL HYSTERECTOMY    . SPINE SURGERY     lumbar spine- around 2014  . upper back surgery     states plate in neck and into right shoulder blade reportedly- around 2014   Social History   Socioeconomic History  . Marital status: Single    Spouse name: Not on file  . Number of children: Not on file  . Years of education: Not on file  . Highest education level: Not on file  Occupational History  . Not on file  Social Needs  . Financial resource strain: Not on file  . Food insecurity    Worry: Not on  file    Inability: Not on file  . Transportation needs    Medical: Not on file    Non-medical: Not on file  Tobacco Use  . Smoking status: Current Every Day Smoker    Packs/day: 0.50    Years: 45.00    Pack years: 22.50    Types: Cigarettes    Start date: 11/11/1969  . Smokeless tobacco: Never Used  . Tobacco comment: 2 cigarettes a day  Substance and Sexual Activity  . Alcohol use: No    Frequency: Never  . Drug use: No  . Sexual activity: Never  Lifestyle  . Physical activity    Days per week: Not on file    Minutes per session: Not on file  . Stress: Not on file  Relationships  . Social Herbalist on phone: Not on file    Gets together: Not on file    Attends religious service: Not on file    Active member of club or organization: Not on file    Attends meetings of clubs or organizations: Not on file    Relationship status: Not on file  Other Topics Concern  . Not on file  Social History Narrative   2LIves with her son and granddaughter- now 24 (often with patient). Also has 1 cat.  Moved to [place] from [place]- slightly closer to family.    Does cleaning/cooking      Disabled due to worsening back/surgeries. 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 Wear and tear from job.    12th grade education      Hobbies: solitaire on laptop, enjoys doing puzzles on the computer   No Known Allergies Family History  Problem Relation Age of Onset  . Hypertension Mother   . Heart attack Mother        75  . Hypertension Father   . Kidney disease Father        dialysis  . Kidney disease Brother        dialysis  . Heart attack Maternal Grandmother   . Early death Sister        died at 35 months     Current Outpatient Medications (Cardiovascular):  .  amLODipine (NORVASC) 10 MG tablet, Take 1 tablet (10 mg total) by mouth daily. Marland Kitchen  lisinopril (ZESTRIL) 40 MG tablet, Take 1 tablet (40 mg total) by mouth daily. .  metoprolol tartrate (LOPRESSOR)  50 MG tablet, Take 1 tablet (50 mg total) by mouth 2 (two) times daily.     Current Outpatient Medications (Other):  .  baclofen (LIORESAL) 10 MG tablet, TAKE 1 TABLET BY MOUTH THREE TIMES A DAY AS NEEDED FOR MUSCLE SPASMS .  buPROPion (WELLBUTRIN XL) 150 MG 24 hr tablet, Take 2 tablets (300 mg total) by mouth daily. .  Cholecalciferol (VITAMIN D3) 50000 units TABS, Take 1 tablet by mouth once a week. .  citalopram (CELEXA) 40 MG tablet, Take 1 tablet (40 mg total) by mouth daily. Marland Kitchen  gabapentin (NEURONTIN) 300 MG capsule, Take 3 capsules (900 mg total) by mouth 3 (three) times daily. .  Vitamin D, Ergocalciferol, (DRISDOL) 1.25 MG (50000 UT) CAPS capsule, Take 1 capsule (50,000 Units total) by mouth every 7 (seven) days.    Past medical history, social, surgical and family history all reviewed in electronic medical record.  No pertanent information unless stated regarding to the chief complaint.   Review of Systems:  No headache, visual changes, nausea, vomiting, diarrhea, constipation, dizziness, abdominal pain, skin rash, fevers, chills, night sweats, weight loss, swollen lymph nodes, body aches, joint swelling, muscle aches, chest pain, shortness of breath, mood changes.   Objective  There were no vitals taken for this visit. Systems examined below as of    General: No apparent distress alert and oriented x3 mood and affect normal, dressed appropriately.  HEENT: Pupils equal, extraocular movements intact  Respiratory: Patient's speak in full sentences and does not appear short of breath  Cardiovascular: No lower extremity edema, non tender, no erythema  Skin: Warm dry intact with no signs of infection or rash on extremities or on axial skeleton.  Abdomen: Soft nontender  Neuro: Cranial nerves II through XII are intact, neurovascularly intact in all extremities with 2+ DTRs and 2+ pulses.  Lymph: No lymphadenopathy of posterior or anterior cervical chain or axillae bilaterally.   Gait normal with good balance and coordination.  MSK:  Non tender with full range of motion and good stability and symmetric strength and tone of shoulders, elbows, wrist, hip, knee and ankles bilaterally.     Impression and Recommendations:     This case required medical decision making of moderate complexity. The above documentation has been reviewed and is accurate and complete Jacqualin Combes       Note: This dictation was prepared with [employer] dictation  along with smaller phrase technology. Any transcriptional errors that result from this process are unintentional.

## 2019-08-11 NOTE — Telephone Encounter (Signed)
Please encourage patient to complete her pap smear- we can place new referral if needed

## 2019-08-11 NOTE — Telephone Encounter (Signed)
Pt states she has not gotten pap smear done due to transportation issues. She states she will try and take an uber but has to see how much that will cost. She does want to get this pap smear done but transportation is the biggest issue.

## 2019-08-25 ENCOUNTER — Other Ambulatory Visit: Payer: Self-pay | Admitting: Family Medicine

## 2019-08-31 ENCOUNTER — Ambulatory Visit: Payer: Self-pay | Admitting: *Deleted

## 2019-08-31 ENCOUNTER — Encounter (HOSPITAL_COMMUNITY): Payer: Self-pay | Admitting: Emergency Medicine

## 2019-08-31 ENCOUNTER — Emergency Department (HOSPITAL_COMMUNITY): Payer: Medicare Other

## 2019-08-31 ENCOUNTER — Inpatient Hospital Stay (HOSPITAL_COMMUNITY)
Admission: EM | Admit: 2019-08-31 | Discharge: 2019-09-04 | DRG: 166 | Disposition: A | Discharge status: Home / Self Care | Payer: Medicare Other | Attending: Internal Medicine | Admitting: Internal Medicine

## 2019-08-31 ENCOUNTER — Ambulatory Visit (INDEPENDENT_AMBULATORY_CARE_PROVIDER_SITE_OTHER): Payer: Medicare Other | Admitting: Neurology

## 2019-08-31 ENCOUNTER — Other Ambulatory Visit: Payer: Self-pay

## 2019-08-31 DIAGNOSIS — R001 Bradycardia, unspecified: Secondary | ICD-10-CM | POA: Diagnosis not present

## 2019-08-31 DIAGNOSIS — Z20828 Contact with and (suspected) exposure to other viral communicable diseases: Secondary | ICD-10-CM | POA: Diagnosis present

## 2019-08-31 DIAGNOSIS — Z90710 Acquired absence of both cervix and uterus: Secondary | ICD-10-CM

## 2019-08-31 DIAGNOSIS — M79672 Pain in left foot: Secondary | ICD-10-CM | POA: Diagnosis not present

## 2019-08-31 DIAGNOSIS — R590 Localized enlarged lymph nodes: Secondary | ICD-10-CM | POA: Diagnosis present

## 2019-08-31 DIAGNOSIS — I10 Essential (primary) hypertension: Secondary | ICD-10-CM | POA: Diagnosis present

## 2019-08-31 DIAGNOSIS — J189 Pneumonia, unspecified organism: Secondary | ICD-10-CM | POA: Diagnosis not present

## 2019-08-31 DIAGNOSIS — R911 Solitary pulmonary nodule: Secondary | ICD-10-CM | POA: Diagnosis not present

## 2019-08-31 DIAGNOSIS — N179 Acute kidney failure, unspecified: Secondary | ICD-10-CM | POA: Diagnosis not present

## 2019-08-31 DIAGNOSIS — F17200 Nicotine dependence, unspecified, uncomplicated: Secondary | ICD-10-CM | POA: Diagnosis not present

## 2019-08-31 DIAGNOSIS — Z841 Family history of disorders of kidney and ureter: Secondary | ICD-10-CM

## 2019-08-31 DIAGNOSIS — F17210 Nicotine dependence, cigarettes, uncomplicated: Secondary | ICD-10-CM | POA: Diagnosis present

## 2019-08-31 DIAGNOSIS — N183 Chronic kidney disease, stage 3 unspecified: Secondary | ICD-10-CM

## 2019-08-31 DIAGNOSIS — I313 Pericardial effusion (noninflammatory): Secondary | ICD-10-CM | POA: Diagnosis present

## 2019-08-31 DIAGNOSIS — Z9889 Other specified postprocedural states: Secondary | ICD-10-CM

## 2019-08-31 DIAGNOSIS — K769 Liver disease, unspecified: Secondary | ICD-10-CM | POA: Diagnosis not present

## 2019-08-31 DIAGNOSIS — I129 Hypertensive chronic kidney disease with stage 1 through stage 4 chronic kidney disease, or unspecified chronic kidney disease: Secondary | ICD-10-CM | POA: Diagnosis present

## 2019-08-31 DIAGNOSIS — M899 Disorder of bone, unspecified: Secondary | ICD-10-CM | POA: Diagnosis present

## 2019-08-31 DIAGNOSIS — R918 Other nonspecific abnormal finding of lung field: Secondary | ICD-10-CM | POA: Diagnosis not present

## 2019-08-31 DIAGNOSIS — I2699 Other pulmonary embolism without acute cor pulmonale: Principal | ICD-10-CM | POA: Diagnosis present

## 2019-08-31 DIAGNOSIS — F3340 Major depressive disorder, recurrent, in remission, unspecified: Secondary | ICD-10-CM | POA: Diagnosis present

## 2019-08-31 DIAGNOSIS — C3411 Malignant neoplasm of upper lobe, right bronchus or lung: Principal | ICD-10-CM | POA: Diagnosis present

## 2019-08-31 DIAGNOSIS — N289 Disorder of kidney and ureter, unspecified: Secondary | ICD-10-CM | POA: Diagnosis not present

## 2019-08-31 DIAGNOSIS — D631 Anemia in chronic kidney disease: Secondary | ICD-10-CM | POA: Diagnosis present

## 2019-08-31 DIAGNOSIS — Z716 Tobacco abuse counseling: Secondary | ICD-10-CM

## 2019-08-31 DIAGNOSIS — R0602 Shortness of breath: Secondary | ICD-10-CM | POA: Diagnosis not present

## 2019-08-31 DIAGNOSIS — G8929 Other chronic pain: Secondary | ICD-10-CM | POA: Diagnosis present

## 2019-08-31 DIAGNOSIS — Z79899 Other long term (current) drug therapy: Secondary | ICD-10-CM

## 2019-08-31 DIAGNOSIS — D649 Anemia, unspecified: Secondary | ICD-10-CM | POA: Diagnosis not present

## 2019-08-31 DIAGNOSIS — M21372 Foot drop, left foot: Secondary | ICD-10-CM | POA: Diagnosis present

## 2019-08-31 DIAGNOSIS — E041 Nontoxic single thyroid nodule: Secondary | ICD-10-CM | POA: Diagnosis not present

## 2019-08-31 DIAGNOSIS — Z8249 Family history of ischemic heart disease and other diseases of the circulatory system: Secondary | ICD-10-CM

## 2019-08-31 DIAGNOSIS — E559 Vitamin D deficiency, unspecified: Secondary | ICD-10-CM | POA: Diagnosis present

## 2019-08-31 DIAGNOSIS — G629 Polyneuropathy, unspecified: Secondary | ICD-10-CM | POA: Diagnosis present

## 2019-08-31 DIAGNOSIS — R079 Chest pain, unspecified: Secondary | ICD-10-CM | POA: Diagnosis not present

## 2019-08-31 DIAGNOSIS — C3491 Malignant neoplasm of unspecified part of right bronchus or lung: Secondary | ICD-10-CM | POA: Diagnosis not present

## 2019-08-31 DIAGNOSIS — I82403 Acute embolism and thrombosis of unspecified deep veins of lower extremity, bilateral: Secondary | ICD-10-CM | POA: Diagnosis present

## 2019-08-31 LAB — CBC WITH DIFFERENTIAL/PLATELET
Abs Immature Granulocytes: 0.04 10*3/uL (ref 0.00–0.07)
Basophils Absolute: 0 10*3/uL (ref 0.0–0.1)
Basophils Relative: 0 %
Eosinophils Absolute: 0.2 10*3/uL (ref 0.0–0.5)
Eosinophils Relative: 2 %
HCT: 31 % — ABNORMAL LOW (ref 36.0–46.0)
Hemoglobin: 9.1 g/dL — ABNORMAL LOW (ref 12.0–15.0)
Immature Granulocytes: 0 %
Lymphocytes Relative: 15 %
Lymphs Abs: 1.7 10*3/uL (ref 0.7–4.0)
MCH: 25.1 pg — ABNORMAL LOW (ref 26.0–34.0)
MCHC: 29.4 g/dL — ABNORMAL LOW (ref 30.0–36.0)
MCV: 85.6 fL (ref 80.0–100.0)
Monocytes Absolute: 0.7 10*3/uL (ref 0.1–1.0)
Monocytes Relative: 6 %
Neutro Abs: 8.8 10*3/uL — ABNORMAL HIGH (ref 1.7–7.7)
Neutrophils Relative %: 77 %
Platelets: 347 10*3/uL (ref 150–400)
RBC: 3.62 MIL/uL — ABNORMAL LOW (ref 3.87–5.11)
RDW: 18.8 % — ABNORMAL HIGH (ref 11.5–15.5)
WBC: 11.4 10*3/uL — ABNORMAL HIGH (ref 4.0–10.5)
nRBC: 0 % (ref 0.0–0.2)

## 2019-08-31 LAB — PROCALCITONIN: Procalcitonin: <0.1 ng/mL

## 2019-08-31 LAB — COMPREHENSIVE METABOLIC PANEL
ALT: 10 U/L (ref 0–44)
AST: 14 U/L — ABNORMAL LOW (ref 15–41)
Albumin: 3.3 g/dL — ABNORMAL LOW (ref 3.5–5.0)
Alkaline Phosphatase: 60 U/L (ref 38–126)
Anion gap: 10 (ref 5–15)
BUN: 19 mg/dL (ref 8–23)
CO2: 21 mmol/L — ABNORMAL LOW (ref 22–32)
Calcium: 8.8 mg/dL — ABNORMAL LOW (ref 8.9–10.3)
Chloride: 110 mmol/L (ref 98–111)
Creatinine, Ser: 1.25 mg/dL — ABNORMAL HIGH (ref 0.44–1.00)
GFR calc Af Amer: 53 mL/min — ABNORMAL LOW (ref >60)
GFR calc non Af Amer: 45 mL/min — ABNORMAL LOW (ref >60)
Glucose, Bld: 114 mg/dL — ABNORMAL HIGH (ref 70–99)
Potassium: 4.7 mmol/L (ref 3.5–5.1)
Sodium: 141 mmol/L (ref 135–145)
Total Bilirubin: 0.4 mg/dL (ref 0.3–1.2)
Total Protein: 7.5 g/dL (ref 6.5–8.1)

## 2019-08-31 LAB — HEPATIC FUNCTION PANEL
ALT: 10 U/L (ref 0–44)
AST: 15 U/L (ref 15–41)
Albumin: 3.4 g/dL — ABNORMAL LOW (ref 3.5–5.0)
Alkaline Phosphatase: 62 U/L (ref 38–126)
Bilirubin, Direct: 0.1 mg/dL (ref 0.0–0.2)
Indirect Bilirubin: 0.5 mg/dL (ref 0.3–0.9)
Total Bilirubin: 0.6 mg/dL (ref 0.3–1.2)
Total Protein: 7.4 g/dL (ref 6.5–8.1)

## 2019-08-31 LAB — SARS CORONAVIRUS 2 BY RT PCR (HOSPITAL ORDER, PERFORMED IN ~~LOC~~ HOSPITAL LAB): SARS Coronavirus 2: NEGATIVE

## 2019-08-31 LAB — PROTIME-INR
INR: 1.1 (ref 0.8–1.2)
Prothrombin Time: 13.8 seconds (ref 11.4–15.2)

## 2019-08-31 LAB — TROPONIN I (HIGH SENSITIVITY): Troponin I (High Sensitivity): 4 ng/L (ref <18)

## 2019-08-31 LAB — APTT: aPTT: 35 seconds (ref 24–36)

## 2019-08-31 LAB — BRAIN NATRIURETIC PEPTIDE: B Natriuretic Peptide: 146.1 pg/mL — ABNORMAL HIGH (ref 0.0–100.0)

## 2019-08-31 MED ORDER — GABAPENTIN 300 MG PO CAPS
900.0000 mg | ORAL_CAPSULE | Freq: Three times a day (TID) | ORAL | Status: DC
  Administered 2019-08-31 – 2019-09-04 (×11): 900 mg via ORAL
  Filled 2019-08-31 (×11): qty 3

## 2019-08-31 MED ORDER — SODIUM CHLORIDE (PF) 0.9 % IJ SOLN
INTRAMUSCULAR | Status: AC
  Administered 2019-08-31: 19:00:00
  Filled 2019-08-31: qty 50

## 2019-08-31 MED ORDER — SODIUM CHLORIDE 0.9% FLUSH: 3.0000 mL | INTRAVENOUS | Status: DC | PRN

## 2019-08-31 MED ORDER — MORPHINE SULFATE (PF) 4 MG/ML IV SOLN: 4.0000 mg | INTRAVENOUS | Status: DC | PRN

## 2019-08-31 MED ORDER — IOHEXOL 300 MG/ML  SOLN
75.0000 mL | Freq: Once | INTRAMUSCULAR | Status: AC | PRN
  Administered 2019-08-31: 75 mL via INTRAVENOUS

## 2019-08-31 MED ORDER — SODIUM CHLORIDE 0.9 % IV SOLN
INTRAVENOUS | Status: DC
  Administered 2019-08-31: 18:00:00 via INTRAVENOUS

## 2019-08-31 MED ORDER — SODIUM CHLORIDE 0.9% FLUSH
3.0000 mL | Freq: Two times a day (BID) | INTRAVENOUS | Status: DC
  Administered 2019-09-02 – 2019-09-04 (×3): 3 mL via INTRAVENOUS

## 2019-08-31 MED ORDER — NICOTINE 7 MG/24HR TD PT24
7.0000 mg | MEDICATED_PATCH | Freq: Every day | TRANSDERMAL | Status: DC
  Administered 2019-08-31 – 2019-09-04 (×5): 7 mg via TRANSDERMAL
  Filled 2019-08-31 (×5): qty 1

## 2019-08-31 MED ORDER — SODIUM CHLORIDE 0.9 % IV SOLN: 250.0000 mL | INTRAVENOUS | Status: DC | PRN

## 2019-08-31 MED ORDER — SODIUM CHLORIDE 0.9 % IV SOLN
500.0000 mg | INTRAVENOUS | Status: DC
  Administered 2019-09-01 (×2): 500 mg via INTRAVENOUS
  Filled 2019-08-31 (×2): qty 500

## 2019-08-31 MED ORDER — HEPARIN BOLUS VIA INFUSION
3000.0000 [IU] | Freq: Once | INTRAVENOUS | Status: AC
  Administered 2019-08-31: 3000 [IU] via INTRAVENOUS
  Filled 2019-08-31: qty 3000

## 2019-08-31 MED ORDER — BUPROPION HCL ER (XL) 150 MG PO TB24
150.0000 mg | ORAL_TABLET | Freq: Every day | ORAL | Status: DC
  Administered 2019-09-01 – 2019-09-04 (×4): 150 mg via ORAL
  Filled 2019-08-31 (×4): qty 1

## 2019-08-31 MED ORDER — METOPROLOL TARTRATE 50 MG PO TABS
50.0000 mg | ORAL_TABLET | Freq: Two times a day (BID) | ORAL | Status: DC
  Administered 2019-08-31 – 2019-09-04 (×6): 50 mg via ORAL
  Filled 2019-08-31 (×10): qty 1

## 2019-08-31 MED ORDER — HEPARIN (PORCINE) 25000 UT/250ML-% IV SOLN
1100.0000 [IU]/h | INTRAVENOUS | Status: DC
  Administered 2019-08-31: 1100 [IU]/h via INTRAVENOUS
  Filled 2019-08-31: qty 250

## 2019-08-31 MED ORDER — HYDROCODONE-ACETAMINOPHEN 5-325 MG PO TABS
1.0000 | ORAL_TABLET | ORAL | Status: DC | PRN
  Administered 2019-09-03: 1 via ORAL
  Administered 2019-09-04: 2 via ORAL
  Filled 2019-08-31: qty 1
  Filled 2019-08-31: qty 2

## 2019-08-31 MED ORDER — CITALOPRAM HYDROBROMIDE 20 MG PO TABS
40.0000 mg | ORAL_TABLET | Freq: Every day | ORAL | Status: DC
  Administered 2019-09-01 – 2019-09-04 (×4): 40 mg via ORAL
  Filled 2019-08-31 (×4): qty 2

## 2019-08-31 MED ORDER — SODIUM CHLORIDE 0.9% FLUSH
3.0000 mL | Freq: Two times a day (BID) | INTRAVENOUS | Status: DC
  Administered 2019-08-31 – 2019-09-02 (×3): 3 mL via INTRAVENOUS

## 2019-08-31 MED ORDER — POLYETHYLENE GLYCOL 3350 17 G PO PACK: 17.0000 g | PACK | Freq: Every day | ORAL | Status: DC | PRN

## 2019-08-31 MED ORDER — AMLODIPINE BESYLATE 10 MG PO TABS
10.0000 mg | ORAL_TABLET | Freq: Every day | ORAL | Status: DC
  Administered 2019-09-01 – 2019-09-04 (×4): 10 mg via ORAL
  Filled 2019-08-31 (×4): qty 1

## 2019-08-31 MED ORDER — SODIUM CHLORIDE 0.9 % IV SOLN
2.0000 g | INTRAVENOUS | Status: DC
  Administered 2019-08-31 – 2019-09-03 (×4): 2 g via INTRAVENOUS
  Filled 2019-08-31: qty 20
  Filled 2019-08-31 (×4): qty 2

## 2019-08-31 MED ORDER — ACETAMINOPHEN 325 MG PO TABS: 650.0000 mg | ORAL_TABLET | Freq: Four times a day (QID) | ORAL | Status: DC | PRN

## 2019-08-31 MED ORDER — ONDANSETRON HCL 4 MG/2ML IJ SOLN: 4.0000 mg | Freq: Four times a day (QID) | INTRAMUSCULAR | Status: DC | PRN

## 2019-08-31 MED ORDER — BACLOFEN 10 MG PO TABS
10.0000 mg | ORAL_TABLET | Freq: Three times a day (TID) | ORAL | Status: DC | PRN
  Administered 2019-08-31: 23:00:00 10 mg via ORAL
  Filled 2019-08-31: qty 1

## 2019-08-31 NOTE — H&P (Signed)
History and Physical    Brianna Saunders YQM:[id number] DOB: 11-Jun-1956 DOA: 08/31/2019  PCP: Marin Olp, MD   Patient coming from: Home   Chief Complaint: Cough, SOB   HPI: Brianna Saunders is a 63 y.o. female with medical history significant for neuropathy, depression, hypertension, and tobacco abuse, now presenting to emergency department with slowly progressive dyspnea and cough.  Patient reports months of insidiously worsening dyspnea and nonproductive cough.  This has worsened over the past couple days to the point where she is short of breath with minimal exertion.  She has not noted any fevers or chills at home, denies chest pain, but reports some ongoing pain in her right upper back for the past couple months at least.  She continues to smoke roughly 1/3 pack/day, notes that she has been cutting back.  She denies any lower extremity swelling or tenderness.  ED Course: Upon arrival to the ED, patient is found to be afebrile, saturating low 90s on room air, tachypneic, and with stable blood pressure.  EKG features a sinus bradycardia with rate 48.  Chemistry panel is notable for creatinine 1.25, up from 1.07 a year ago.  CBC features a mild leukocytosis and a normocytic anemia with hemoglobin 9.1, down from 11.3 a year ago.  CTA chest is concerning for a large right upper lobe mass that appears contiguous with mediastinal mass or adenopathy, encases right pulmonary arteries, may invade the left atrium, and a left lower lobe pulmonary embolism is also noted.  Patient was started on IV fluids in the emergency department.  COVID-19 testing is in process.  Review of Systems:  All other systems reviewed and apart from HPI, are negative.  Past Medical History:  Diagnosis Date   Chicken pox    Hypertension     Past Surgical History:  Procedure Laterality Date   FRACTURE SURGERY     PARTIAL HYSTERECTOMY     SPINE SURGERY     lumbar spine- around 2014   upper  back surgery     states plate in neck and into right shoulder blade reportedly- around 2014     reports that she has been smoking cigarettes. She started smoking about 49 years ago. She has a 22.50 pack-year smoking history. She has never used smokeless tobacco. She reports that she does not drink alcohol or use drugs.  No Known Allergies  Family History  Problem Relation Age of Onset   Hypertension Mother    Heart attack Mother        44   Hypertension Father    Kidney disease Father        dialysis   Kidney disease Brother        dialysis   Heart attack Maternal Grandmother    Early death Sister        died at 25 months     Prior to Admission medications   Medication Sig Start Date End Date Taking? Authorizing Provider  amLODipine (NORVASC) 10 MG tablet Take 1 tablet (10 mg total) by mouth daily. 03/16/19  Yes Marin Olp, MD  baclofen (LIORESAL) 10 MG tablet TAKE 1 TABLET BY MOUTH THREE TIMES A DAY AS NEEDED FOR MUSCLE SPASMS Patient taking differently: Take 10 mg by mouth 3 (three) times daily.  03/16/19  Yes Marin Olp, MD  buPROPion (WELLBUTRIN XL) 150 MG 24 hr tablet Take 2 tablets (300 mg total) by mouth daily. Patient taking differently: Take 150 mg by mouth daily.  03/16/19  Yes Marin Olp, MD  citalopram (CELEXA) 40 MG tablet Take 1 tablet (40 mg total) by mouth daily. 03/16/19  Yes Marin Olp, MD  gabapentin (NEURONTIN) 300 MG capsule Take 3 capsules (900 mg total) by mouth 3 (three) times daily. 03/16/19  Yes Marin Olp, MD  lisinopril (ZESTRIL) 40 MG tablet Take 1 tablet (40 mg total) by mouth daily. 03/16/19  Yes Marin Olp, MD  metoprolol tartrate (LOPRESSOR) 50 MG tablet Take 1 tablet (50 mg total) by mouth 2 (two) times daily. 03/16/19  Yes Marin Olp, MD  Vitamin D, Ergocalciferol, (DRISDOL) 1.25 MG (50000 UT) CAPS capsule TAKE 1 CAPSULE (50,000 UNITS TOTAL) BY MOUTH EVERY 7 (SEVEN) DAYS. 08/25/19  Yes Marin Olp, MD     Physical Exam: Vitals:   08/31/19 1607 08/31/19 1630 08/31/19 1730 08/31/19 2024  BP: (!) 112/53 110/75 105/72 124/84  Pulse: 95 (!) 46 (!) 46 73  Resp: 20 (!) 23 (!) 22 (!) 22  Temp: 98.3 F (36.8 C)     TempSrc: Oral     SpO2: 93% 94% 93% 97%    Constitutional: NAD, calm  Eyes: PERTLA, lids and conjunctivae normal ENMT: Mucous membranes are moist. Posterior pharynx clear of any exudate or lesions.   Neck: normal, supple, no masses, no thyromegaly Respiratory: Rhonchi on right. Mild tachypnea. Speaking full sentences.  Cardiovascular: S1 & S2 heard, regular rate and rhythm. No extremity edema.   Abdomen: No distension, no tenderness, soft. Bowel sounds active.  Musculoskeletal: no clubbing / cyanosis. No joint deformity upper and lower extremities.    Skin: no significant rashes, lesions, ulcers. Warm, dry, well-perfused. Neurologic: No facial asymmetry. Sensation to light touch diminished in bilateral LE's. Moving all extremities.  Psychiatric: Alert and oriented x 3. Calm, cooperative.    Labs on Admission: I have personally reviewed following labs and imaging studies  CBC: Recent Labs  Lab 08/31/19 1730  WBC 11.4*  NEUTROABS 8.8*  HGB 9.1*  HCT 31.0*  MCV 85.6  PLT 174   Basic Metabolic Panel: Recent Labs  Lab 08/31/19 1745  NA 141  K 4.7  CL 110  CO2 21*  GLUCOSE 114*  BUN 19  CREATININE 1.25*  CALCIUM 8.8*   GFR: CrCl cannot be calculated (Unknown ideal weight.). Liver Function Tests: Recent Labs  Lab 08/31/19 1730 08/31/19 1745  AST 15 14*  ALT 10 10  ALKPHOS 62 60  BILITOT 0.6 0.4  PROT 7.4 7.5  ALBUMIN 3.4* 3.3*   No results for input(s): LIPASE, AMYLASE in the last 168 hours. No results for input(s): AMMONIA in the last 168 hours. Coagulation Profile: No results for input(s): INR, PROTIME in the last 168 hours. Cardiac Enzymes: No results for input(s): CKTOTAL, CKMB, CKMBINDEX, TROPONINI in the last 168 hours. BNP (last 3  results) No results for input(s): PROBNP in the last 8760 hours. HbA1C: No results for input(s): HGBA1C in the last 72 hours. CBG: No results for input(s): GLUCAP in the last 168 hours. Lipid Profile: No results for input(s): CHOL, HDL, LDLCALC, TRIG, CHOLHDL, LDLDIRECT in the last 72 hours. Thyroid Function Tests: No results for input(s): TSH, T4TOTAL, FREET4, T3FREE, THYROIDAB in the last 72 hours. Anemia Panel: No results for input(s): VITAMINB12, FOLATE, FERRITIN, TIBC, IRON, RETICCTPCT in the last 72 hours. Urine analysis: No results found for: COLORURINE, APPEARANCEUR, LABSPEC, PHURINE, GLUCOSEU, HGBUR, BILIRUBINUR, KETONESUR, PROTEINUR, UROBILINOGEN, NITRITE, LEUKOCYTESUR Sepsis Labs: @LABRCNTIP (procalcitonin:4,lacticidven:4) )No results found for this or any previous visit (from  the past 240 hour(s)).   Radiological Exams on Admission: Dg Chest 2 View  Result Date: 08/31/2019 CLINICAL DATA:  Chest pain, shortness of breath. EXAM: CHEST - 2 VIEW COMPARISON:  None. FINDINGS: The heart size and mediastinal contours are within normal limits. No pneumothorax or pleural effusion is noted. Left lung is clear. Right midlung and lower lobe opacities are noted concerning for atelectasis or possibly infiltrates. There is noted right perihilar rounded opacity concerning for possible neoplasm. The visualized skeletal structures are unremarkable. IMPRESSION: Right perihilar rounded opacity is noted concerning for possible neoplasm or malignancy. More peripheral opacities are noted concerning for inflammation or possible atelectasis. CT scan of the chest with intravenous contrast is recommended for further evaluation. Electronically Signed   By: Marijo Conception M.D.   On: 08/31/2019 16:55   Ct Chest W Contrast  Result Date: 08/31/2019 CLINICAL DATA:  Abnormal chest x-ray shortness of breath EXAM: CT CHEST WITH CONTRAST TECHNIQUE: Multidetector CT imaging of the chest was performed during  intravenous contrast administration. CONTRAST:  60mL OMNIPAQUE IOHEXOL 300 MG/ML  SOLN COMPARISON:  Chest x-ray 08/31/2019 FINDINGS: Cardiovascular: Nonaneurysmal aorta. Moderate aortic atherosclerosis. No dissection seen. Slightly enlarged pulmonary trunk up to 3.5 cm. Borderline cardiomegaly. Small pericardial effusion measuring up to 14 mm in thickness. 19 mm lobulated filling defect within the right side of the left atrium. Appears contiguous with soft tissue mass in the mediastinum. Coronary vascular calcification. Suspected small nonocclusive filling defect within subsegmental left lower lobe pulmonary artery, series 2, image number 103 and 104. Abduct truncation of the descending right pulmonary artery with essentially no significant enhancement of right middle or lower lobe pulmonary vessels. Narrowing of right upper lobe pulmonary arterial vessels by mediastinal soft tissue mass. Mediastinum/Nodes: Midline trachea. Subcentimeter hypodense thyroid nodules. Coarse calcifications in the thyroid. Esophagus within normal limits. Enlarged precarinal node measuring 16 mm. Subcarinal node measuring 14 mm. Ill-defined soft tissue density within the right hilus, encasing right upper lobe pulmonary vessels. Lungs/Pleura: Mild emphysema. Partially cavitary mass within the right upper lobe measuring approximately 4.1 cm x 6.4 cm by 3.8 cm. This is contiguous with ill-defined soft tissue mass/adenopathy within the mediastinum 12 mm irregular nodule in the right upper lobe, series 5, image number 49. Ill-defined consolidations within the right upper lobe and the right middle lobe. Diffuse ground-glass density in the right lower lobe with small consolidations. No pleural effusion or pneumothorax. Upper Abdomen: Numerous hypodense liver lesions, some of which are consistent with cysts. Atrophic partially visualized right kidney. Musculoskeletal: Incompletely visualized age indeterminate fracture at the anterior inferior  endplate T12 IMPRESSION: 1. Large poorly defined partially cavitary mass in the right upper lobe measuring approximately 4.1 x 6.4 x 3.8 cm, contiguous with ill-defined mass and/or adenopathy within the mediastinum. 1.9 cm pedunculated filling defect within the right aspect of the left atrium, appears contiguous with mediastinal soft tissue density and is concerning for left atrial invasion. There is narrowing and partial encasement of right upper lobe pulmonary arteries. 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 2. Small nonocclusive thrombus within subsegmental left lower lobe pulmonary artery. 3. 12 mm irregular right upper lobe pulmonary nodule which is either infectious, inflammatory, or neoplastic. Multifocal ill-defined consolidations in the right upper and middle lobes with diffuse ground-glass density  in the right lower lobe, findings could be secondary to pneumonia, with consideration given to associated pulmonary infarcts given occluded appearance of right middle and lower lobe pulmonary arteries. 4. Numerous hypodense liver lesions, some of which are consistent with cysts. Given findings in the chest, recommend correlation with nonemergent MRI. 5. Incompletely visualized age indeterminate fracture inferior endplate T12 6. Small pericardial effusion Critical Value/emergent results were called by telephone at the time of interpretation on 08/31/2019 at 7:54 pm to provider[facility]LLIOTT [facility] , who verbally acknowledged these results. Aortic Atherosclerosis (ICD10-I70.0) and Emphysema (ICD10-J43.9). Electronically Signed   By: Donavan Foil M.D.   On: 08/31/2019 19:54    EKG: Independently reviewed. Sinus bradycardia, rate 48.   Assessment/Plan    1. Lung mass; pulmonary embolism; ?pneumonia  - Presents with slowly progressive SOB and cough, found to have large RUL mass with mediastinal mass or adenopathy, encasement of pulmonary arteries, pulmonary embolism, possible infection, and suspected left atrial invasion  - Start IV heparin, Rocephin, and azithromycin, keep NPO after midnight, continue as-needed supplemental O2 and supportive care   2. Mild renal insufficiency  - SCr is 1.25 on admission, up from 1.07 a year earlier  - Renally-dose medications, hold lisinopril initially, monitor   3. Normocytic anemia  - Hgb is 9.1 on admission, down from 11.3 a year ago  - She denies melena or hematochezia, will monitor closely while on anticoagulation    4. Hypertension  - BP at goal, hold lisinopril given elevated SCr, and continue Norvasc and metoprolol as tolerated    5. Current smoker  - She reports she has been cutting back, plans to quit - Nicotine patch provided, cessation encouraged    6. Depression  - Continue Celexa and Wellbutrin    7. Liver lesions  - Multiple hypodense liver lesions noted on CT in ED - Non-urgent MRI recommended    PPE: Mask, face shield  DVT prophylaxis: IV heparin  Code Status: Full  Family Communication: Discussed with patient  Consults called: None  Admission status: Observation for now as plan likely to be determined by consultant tomorrow     Vianne Bulls, MD [facility] Pager [phone]  If 7PM-7AM, please contact night-coverage [email or web address] Password [facility]  08/31/2019, 9:56 PM

## 2019-08-31 NOTE — ED Notes (Signed)
Patient ambulated to restroom with no assistance.

## 2019-08-31 NOTE — ED Notes (Signed)
Patient transported to CT 

## 2019-08-31 NOTE — Progress Notes (Signed)
ANTICOAGULATION CONSULT NOTE - Initial Consult  Pharmacy Consult for Heparin Indication: pulmonary embolus  No Known Allergies  Patient Measurements: Height: 5\' 4"  (162.6 cm) Weight: 158 lb 6.4 oz (71.8 kg) IBW/kg (Calculated) : 54.7 Heparin Dosing Weight:   Vital Signs: Temp: 98 F (36.7 C) (10/20 2222) Temp Source: Oral (10/20 2222) BP: 108/80 (10/20 2222) Pulse Rate: 60 (10/20 2222)  Labs: Recent Labs    08/31/19 1730 08/31/19 1745  HGB 9.1*  --   HCT 31.0*  --   PLT 347  --   CREATININE  --  1.25*    Estimated Creatinine Clearance: 44.2 mL/min (A) (by C-G formula based on SCr of 1.25 mg/dL (H)).   Medical History: Past Medical History:  Diagnosis Date  . Chicken pox   . Hypertension     Medications:  Infusions:  . sodium chloride    . azithromycin    . cefTRIAXone (ROCEPHIN)  IV    . heparin      Assessment: Patient in hospital with new PE.  No oral anticoagulants noted on med rec. Baseline coags ordred.    Goal of Therapy:  Heparin level 0.3-0.7 units/ml Monitor platelets by anticoagulation protocol: Yes   Plan:  Heparin bolus  3000 units iv x1 Heparin drip at 1100 units/hr Daily CBC Next heparin level at 0800    Tyler Deis, Shea Stakes [place] 08/31/2019,10:23 PM

## 2019-08-31 NOTE — Telephone Encounter (Signed)
FYI

## 2019-08-31 NOTE — ED Triage Notes (Signed)
Per pt, states she is SOB for 2 months-states it has gotten "worser" the past couple of days-PCP told her to come to ED

## 2019-08-31 NOTE — ED Notes (Signed)
Patient experienced increased SOB after using bedside commode. Assisted patient to bed. SpO2 remained WNL.

## 2019-08-31 NOTE — ED Provider Notes (Signed)
[name] DEPT Provider Note   CSN: [id number] Arrival date & time: 08/31/19  1543     History   Chief Complaint Chief Complaint  Patient presents with  . Shortness of Breath    HPI Brianna Saunders is a 63 y.o. female.     HPI She presents for evaluation of a sensation of shortness of breath with heaviness in her right chest, present for several weeks.  Symptoms occur both at rest, when lying down, and when walking.  Incidentally, patient was evaluated today with EMG, for peripheral neuropathy.  Report in chart indicates that the study was normal.  She has occasional cough without sputum production.  She denies fever, chills, sneezing, sore throat, ear pain, nausea, vomiting, weakness or dizziness.  She is a cigarette smoker.  She has not previously been evaluated for this problem.  There are no other known modifying factors.   Past Medical History:  Diagnosis Date  . Chicken pox   . Hypertension     Patient Active Problem List   Diagnosis Date Noted  . Lung mass 08/31/2019  . Pulmonary embolism ([place]) 08/31/2019  . Left foot drop 07/09/2019  . Vitamin D deficiency 04/08/2019  . Cervical spinal stenosis 08/27/2018  . Difficulty walking 01/28/2018  . Current smoker 12/18/2017  . Hypertension 11/27/2017  . Chronic pain 11/27/2017  . Major depression in full remission [facility])     Past Surgical History:  Procedure Laterality Date  . FRACTURE SURGERY    . PARTIAL HYSTERECTOMY    . SPINE SURGERY     lumbar spine- around 2014  . upper back surgery     states plate in neck and into right shoulder blade reportedly- around 2014     OB History   No obstetric history on file.      Home Medications    Prior to Admission medications   Medication Sig Start Date End Date Taking? Authorizing Provider  amLODipine (NORVASC) 10 MG tablet Take 1 tablet (10 mg total) by mouth daily. 03/16/19  Yes Marin Olp, MD  baclofen  (LIORESAL) 10 MG tablet TAKE 1 TABLET BY MOUTH THREE TIMES A DAY AS NEEDED FOR MUSCLE SPASMS Patient taking differently: Take 10 mg by mouth 3 (three) times daily.  03/16/19  Yes Marin Olp, MD  buPROPion (WELLBUTRIN XL) 150 MG 24 hr tablet Take 2 tablets (300 mg total) by mouth daily. Patient taking differently: Take 150 mg by mouth daily.  03/16/19  Yes Marin Olp, MD  citalopram (CELEXA) 40 MG tablet Take 1 tablet (40 mg total) by mouth daily. 03/16/19  Yes Marin Olp, MD  gabapentin (NEURONTIN) 300 MG capsule Take 3 capsules (900 mg total) by mouth 3 (three) times daily. 03/16/19  Yes Marin Olp, MD  lisinopril (ZESTRIL) 40 MG tablet Take 1 tablet (40 mg total) by mouth daily. 03/16/19  Yes Marin Olp, MD  metoprolol tartrate (LOPRESSOR) 50 MG tablet Take 1 tablet (50 mg total) by mouth 2 (two) times daily. 03/16/19  Yes Marin Olp, MD  Vitamin D, Ergocalciferol, (DRISDOL) 1.25 MG (50000 UT) CAPS capsule TAKE 1 CAPSULE (50,000 UNITS TOTAL) BY MOUTH EVERY 7 (SEVEN) DAYS. 08/25/19  Yes Marin Olp, MD    Family History Family History  Problem Relation Age of Onset  . Hypertension Mother   . Heart attack Mother        42  . Hypertension Father   . Kidney disease Father  dialysis  . Kidney disease Brother        dialysis  . Heart attack Maternal Grandmother   . Early death Sister        died at 80 months    Social History Social History   Tobacco Use  . Smoking status: Current Every Day Smoker    Packs/day: 0.50    Years: 45.00    Pack years: 22.50    Types: Cigarettes    Start date: 11/11/1969  . Smokeless tobacco: Never Used  . Tobacco comment: 2 cigarettes a day  Substance Use Topics  . Alcohol use: No    Frequency: Never  . Drug use: No     Allergies   Patient has no known allergies.   Review of Systems Review of Systems  All other systems reviewed and are negative.    Physical Exam Updated Vital Signs BP 124/84 (BP  Location: Left Arm)   Pulse 73   Temp 98.3 F (36.8 C) (Oral)   Resp (!) 22   SpO2 97%   Physical Exam Vitals signs and nursing note reviewed.  Constitutional:      General: She is not in acute distress.    Appearance: She is well-developed. She is not ill-appearing or diaphoretic.  HENT:     Head: Normocephalic and atraumatic.     Right Ear: External ear normal.     Left Ear: External ear normal.  Eyes:     Conjunctiva/sclera: Conjunctivae normal.     Pupils: Pupils are equal, round, and reactive to light.  Neck:     Musculoskeletal: Normal range of motion and neck supple.     Trachea: Phonation normal.  Cardiovascular:     Rate and Rhythm: Normal rate and regular rhythm.     Heart sounds: Normal heart sounds.  Pulmonary:     Effort: Pulmonary effort is normal. No respiratory distress.     Breath sounds: No stridor. Rhonchi (Diffuse) present. No wheezing.  Chest:     Chest wall: No tenderness.  Abdominal:     Palpations: Abdomen is soft.     Tenderness: There is no abdominal tenderness.  Musculoskeletal: Normal range of motion.  Skin:    General: Skin is warm and dry.  Neurological:     Mental Status: She is alert and oriented to person, place, and time.     Cranial Nerves: No cranial nerve deficit.     Sensory: No sensory deficit.     Motor: No abnormal muscle tone.     Coordination: Coordination normal.  Psychiatric:        Mood and Affect: Mood normal.        Behavior: Behavior normal.        Thought Content: Thought content normal.        Judgment: Judgment normal.      ED Treatments / Results  Labs (all labs ordered are listed, but only abnormal results are displayed) Labs Reviewed  CBC WITH DIFFERENTIAL/PLATELET - Abnormal; Notable for the following components:      Result Value   WBC 11.4 (*)    RBC 3.62 (*)    Hemoglobin 9.1 (*)    HCT 31.0 (*)    MCH 25.1 (*)    MCHC 29.4 (*)    RDW 18.8 (*)    Neutro Abs 8.8 (*)    All other components  within normal limits  HEPATIC FUNCTION PANEL - Abnormal; Notable for the following components:   Albumin 3.4 (*)  All other components within normal limits  COMPREHENSIVE METABOLIC PANEL - Abnormal; Notable for the following components:   CO2 21 (*)    Glucose, Bld 114 (*)    Creatinine, Ser 1.25 (*)    Calcium 8.8 (*)    Albumin 3.3 (*)    AST 14 (*)    GFR calc non Af Amer 45 (*)    GFR calc Af Amer 53 (*)    All other components within normal limits  SARS CORONAVIRUS 2 BY RT PCR [facility] ORDER, [place] LAB)    EKG EKG Interpretation  Date/Time:  Tuesday August 31 2019 16:23:22 EDT Ventricular Rate:  48 PR Interval:    QRS Duration: 96 QT Interval:  513 QTC Calculation: 459 R Axis:   60 Text Interpretation:  Sinus bradycardia Nonspecific T abnrm, anterolateral leads No old tracing to compare Confirmed by Daleen Bo [phone]) on 08/31/2019 5:29:39 PM   Radiology Dg Chest 2 View  Result Date: 08/31/2019 CLINICAL DATA:  Chest pain, shortness of breath. EXAM: CHEST - 2 VIEW COMPARISON:  None. FINDINGS: The heart size and mediastinal contours are within normal limits. No pneumothorax or pleural effusion is noted. Left lung is clear. Right midlung and lower lobe opacities are noted concerning for atelectasis or possibly infiltrates. There is noted right perihilar rounded opacity concerning for possible neoplasm. The visualized skeletal structures are unremarkable. IMPRESSION: Right perihilar rounded opacity is noted concerning for possible neoplasm or malignancy. More peripheral opacities are noted concerning for inflammation or possible atelectasis. CT scan of the chest with intravenous contrast is recommended for further evaluation. Electronically Signed   By: Marijo Conception M.D.   On: 08/31/2019 16:55   Ct Chest W Contrast  Result Date: 08/31/2019 CLINICAL DATA:  Abnormal chest x-ray shortness of breath EXAM: CT CHEST WITH CONTRAST TECHNIQUE:  Multidetector CT imaging of the chest was performed during intravenous contrast administration. CONTRAST:  64mL OMNIPAQUE IOHEXOL 300 MG/ML  SOLN COMPARISON:  Chest x-ray 08/31/2019 FINDINGS: Cardiovascular: Nonaneurysmal aorta. Moderate aortic atherosclerosis. No dissection seen. Slightly enlarged pulmonary trunk up to 3.5 cm. Borderline cardiomegaly. Small pericardial effusion measuring up to 14 mm in thickness. 19 mm lobulated filling defect within the right side of the left atrium. Appears contiguous with soft tissue mass in the mediastinum. Coronary vascular calcification. Suspected small nonocclusive filling defect within subsegmental left lower lobe pulmonary artery, series 2, image number 103 and 104. Abduct truncation of the descending right pulmonary artery with essentially no significant enhancement of right middle or lower lobe pulmonary vessels. Narrowing of right upper lobe pulmonary arterial vessels by mediastinal soft tissue mass. Mediastinum/Nodes: Midline trachea. Subcentimeter hypodense thyroid nodules. Coarse calcifications in the thyroid. Esophagus within normal limits. Enlarged precarinal node measuring 16 mm. Subcarinal node measuring 14 mm. Ill-defined soft tissue density within the right hilus, encasing right upper lobe pulmonary vessels. Lungs/Pleura: Mild emphysema. Partially cavitary mass within the right upper lobe measuring approximately 4.1 cm x 6.4 cm by 3.8 cm. This is contiguous with ill-defined soft tissue mass/adenopathy within the mediastinum 12 mm irregular nodule in the right upper lobe, series 5, image number 49. Ill-defined consolidations within the right upper lobe and the right middle lobe. Diffuse ground-glass density in the right lower lobe with small consolidations. No pleural effusion or pneumothorax. Upper Abdomen: Numerous hypodense liver lesions, some of which are consistent with cysts. Atrophic partially visualized right kidney. Musculoskeletal: Incompletely  visualized age indeterminate fracture at the anterior inferior endplate T12 IMPRESSION: 1.  Large poorly defined partially cavitary mass in the right upper lobe measuring approximately 4.1 x 6.4 x 3.8 cm, contiguous with ill-defined mass and/or adenopathy within the mediastinum. 1.9 cm pedunculated filling defect within the right aspect of the left atrium, appears contiguous with mediastinal soft tissue density and is concerning for left atrial invasion. There is narrowing and partial encasement of right upper lobe pulmonary arteries. 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 2. Small nonocclusive thrombus within subsegmental left lower lobe pulmonary artery. 3. 12 mm irregular right upper lobe pulmonary nodule which is either infectious, inflammatory, or neoplastic. Multifocal ill-defined consolidations in the right upper and middle lobes with diffuse ground-glass density in the right lower lobe, findings could be secondary to pneumonia, with consideration given to associated pulmonary infarcts given occluded appearance of right middle and lower lobe pulmonary arteries. 4. Numerous hypodense liver lesions, some of which are consistent with cysts. Given findings in the chest, recommend correlation with nonemergent MRI. 5. Incompletely visualized age indeterminate fracture inferior endplate T12 6. Small pericardial effusion Critical Value/emergent results were called by telephone at the time of interpretation on 08/31/2019 at 7:54 pm to providerELLIOTT [facility] , who verbally acknowledged these results. Aortic Atherosclerosis (ICD10-I70.0) and Emphysema (ICD10-J43.9). Electronically Signed   By: Donavan Foil M.D.   On: 08/31/2019 19:54    Procedures  Procedures (including critical care time)  Medications Ordered in ED Medications  0.9 %  sodium chloride infusion ( Intravenous New Bag/Given 08/31/19 1751)  iohexol (OMNIPAQUE) 300 MG/ML solution 75 mL (75 mLs Intravenous Contrast Given 08/31/19 1907)  sodium chloride (PF) 0.9 % injection (  Given by Other 08/31/19 1907)     Initial Impression / Assessment and Plan / ED Course  I have reviewed the triage vital signs and the nursing notes.  Pertinent labs & imaging results that were available during my care of the patient were reviewed by me and considered in my medical decision making (see chart for details).  Clinical Course as of Aug 30 2050  Tue Aug 31, 2019  1840 Normal except CO2, glucose high, creatinine high, calcium low, albumin low, AST low, GFR low  Comprehensive metabolic panel(!) [EW]  3825 Normal except albumin  Hepatic function panel(!) [EW]  1840 Normal except white count high, hemoglobin  CBC with Differential(!) [EW]  1840 Abnormal consistent with right lung mass.  Images reviewed by me  DG Chest 2 View [EW]    Clinical Course User Index [EW] Daleen Bo, MD        Patient Vitals for the past 24 hrs:  BP Temp Temp src Pulse Resp SpO2  08/31/19 2024 124/84 - - 73 (!) 22 97 %  08/31/19 1730 105/72 - - (!) 46 (!) 22 93 %  08/31/19 1630 110/75 - - (!) 46 (!) 23 94 %  08/31/19 1607 (!) 112/53 98.3 F (36.8 C) Oral 95 20 93 %    8:12 PM Reevaluation with update and discussion. After initial assessment and treatment, an updated evaluation reveals findings discussed with the patient, as well as her daughter by phone, and all questions were answered. Daleen Bo   Medical Decision Making: Shortness of breath, with chest discomfort, subacute, with abnormal findings concerning for lung cancer, complicated with cardiac involvement.  Patient is hemodynamically  stable without oxygen requirement.  She has a small pulmonary embolus, which is likely secondary not  primarily causing symptoms.  CRITICAL CARE- no Performed by: Daleen Bo  Nursing Notes Reviewed/ Care Coordinated Applicable Imaging Reviewed Interpretation of Laboratory Data incorporated into ED treatment  8:40 PM-Consult complete with hospitalist. Patient case explained and discussed.  He agrees to admit patient for further evaluation and treatment. Call ended at 8:50 PM   Final Clinical Impressions(s) / ED Diagnoses   Final diagnoses:  Malignant neoplasm of right lung, unspecified part of lung ([place])  Acute pulmonary embolism without acute cor pulmonale, unspecified pulmonary embolism type [facility])    ED Discharge Orders    None       Daleen Bo, MD 08/31/19 2051

## 2019-08-31 NOTE — Telephone Encounter (Signed)
Patient's daughter is calling to report that her mother is having labored breathing. Patient has labored breathing when sitting and it gets worse with exertion. She reports it is getting worse over time and mother is a heavy smoker also.  Advised due to protocol- she needs to go to ED for evaluation  Reason for Disposition . [1] MODERATE difficulty breathing (e.g., speaks in phrases, SOB even at rest, pulse 100-120) AND [2] NEW-onset or WORSE than normal  Answer Assessment - Initial Assessment Questions 1. RESPIRATORY STATUS: "Describe your breathing?" (e.g., wheezing, shortness of breath, unable to speak, severe coughing)      Breathing heavier than normal- when sitting still 2. ONSET: "When did this breathing problem begin?"      1 month 3. PATTERN "Does the difficult breathing come and go, or has it been constant since it started?"      constant 4. SEVERITY: "How bad is your breathing?" (e.g., mild, moderate, severe)    - MILD: No SOB at rest, mild SOB with walking, speaks normally in sentences, can lay down, no retractions, pulse < 100.    - MODERATE: SOB at rest, SOB with minimal exertion and prefers to sit, cannot lie down flat, speaks in phrases, mild retractions, audible wheezing, pulse 100-120.    - SEVERE: Very SOB at rest, speaks in single words, struggling to breathe, sitting hunched forward, retractions, pulse > 120      Mild/moderate 5. RECURRENT SYMPTOM: "Have you had difficulty breathing before?" If so, ask: "When was the last time?" and "What happened that time?"      no 6. CARDIAC HISTORY: "Do you have any history of heart disease?" (e.g., heart attack, angina, bypass surgery, angioplasty)      no 7. LUNG HISTORY: "Do you have any history of lung disease?"  (e.g., pulmonary embolus, asthma, emphysema)     no 8. CAUSE: "What do you think is causing the breathing problem?"      Not sure- heavy smoker- blood counts off 9. OTHER SYMPTOMS: "Do you have any other symptoms?  (e.g., dizziness, runny nose, cough, chest pain, fever)     no 10. PREGNANCY: "Is there any chance you are pregnant?" "When was your last menstrual period?"       n/a 11. TRAVEL: "Have you traveled out of the country in the last month?" (e.g., travel history, exposures)       no  Protocols used: BREATHING DIFFICULTY-A-AH

## 2019-08-31 NOTE — Telephone Encounter (Signed)
See note

## 2019-08-31 NOTE — Telephone Encounter (Signed)
Agree with disposition-thanks

## 2019-08-31 NOTE — Procedures (Signed)
[facility]  [address]  [place], [place] [address] Tel: [phone] Fax:  [phone] Test Date:  08/31/2019  Patient: Brianna Saunders DOB: [birth year] Physician: Narda Amber, DO  Sex: Female Height: 5\' 4"  Ref Phys: Hulan Saas, DO  ID#: [id number] Temp: 32.0C Technician:    Patient Complaints: This is a 63 year old female with history of lumbar surgery referred for evaluation of left foot pain and weakness.  NCV & EMG Findings: Extensive electrodiagnostic testing of the left lower extremity and additional studies of the right shows:  1. Bilateral sural and superficial peroneal sensory responses are within normal limits. 2. Left peroneal motor response shows mildly reduced amplitude (1.5 mV) at the extensor digitorum brevis, and is normal at the tibialis anterior.  Bilateral tibial and right peroneal motor responses are within normal limits. 3. Bilateral tibial H reflex studies are within normal limits. 4. Despite good effort and tolerability, a variable patten of motor unit recruitment is seen in the left tibialis anterior and flexor digitorum longus muscles.  Motor unit configuration is within normal limits in all the tested muscles.    Impression: This is a normal study of the lower extremities.  In particular, there is no evidence of a sensorimotor polyneuropathy, lumbosacral radiculopathy, or a left peroneal mononeuropathy.  Of note, there is a variable pattern of motor unit activation in the left lower extremity, these findings are most likely due to central disorder of motor unit control.   ___________________________ Narda Amber, DO    Nerve Conduction Studies Anti Sensory Summary Table   Site NR Peak (ms) Norm Peak (ms) P-T Amp (V) Norm P-T Amp  Left Sup Peroneal Anti Sensory (Ant Lat Mall)  32C  12 cm    3.0 <4.6 16.7 >3  Right Sup Peroneal Anti Sensory (Ant Lat Mall)  32C  12 cm    2.7 <4.6 15.8 >3  Left Sural Anti Sensory (Lat  Mall)  32C  Calf    3.4 <4.6 16.1 >3  Right Sural Anti Sensory (Lat Mall)  32C  Calf    3.3 <4.6 14.7 >3   Motor Summary Table   Site NR Onset (ms) Norm Onset (ms) O-P Amp (mV) Norm O-P Amp Site1 Site2 Delta-0 (ms) Dist (cm) Vel (m/s) Norm Vel (m/s)  Left Peroneal Motor (Ext Dig Brev)  32C  Ankle    4.3 <6.0 1.5 >2.5 B Fib Ankle 6.9 37.0 54 >40  B Fib    11.2  1.5  Poplt B Fib 1.5 7.0 47 >40  Poplt    12.7  1.5         Right Peroneal Motor (Ext Dig Brev)  32C  Ankle    3.1 <6.0 3.7 >2.5 B Fib Ankle 7.5 37.0 49 >40  B Fib    10.6  3.5  Poplt B Fib 1.3 7.0 54 >40  Poplt    11.9  3.5         Left Peroneal TA Motor (Tib Ant)  32C  Fib Head    2.8 <4.5 3.5 >3 Poplit Fib Head 1.3 7.0 54 >40  Poplit    4.1  3.4         Left Tibial Motor (Abd Hall Brev)  32C  Ankle    4.8 <6.0 5.0 >4 Knee Ankle 7.6 40.0 53 >40  Knee    12.4  3.2         Right Tibial Motor (Abd Hall Brev)  32C  Ankle  3.6 <6.0 6.4 >4 Knee Ankle 8.1 39.0 48 >40  Knee    11.7  3.9          H Reflex Studies   NR H-Lat (ms) Lat Norm (ms) L-R H-Lat (ms)  Left Tibial (Gastroc)  32C     33.61 <35 0.00  Right Tibial (Gastroc)  32C     33.61 <35 0.00   EMG   Side Muscle Ins Act Fibs Psw Fasc Number Recrt Dur Dur. Amp Amp. Poly Poly. Comment  Right AntTibialis Nml Nml Nml Nml Nml Nml Nml Nml Nml Nml Nml Nml N/A  Right Gastroc Nml Nml Nml Nml Nml Nml Nml Nml Nml Nml Nml Nml N/A  Right Flex Dig Long Nml Nml Nml Nml Nml Nml Nml Nml Nml Nml Nml Nml N/A  Right RectFemoris Nml Nml Nml Nml Nml Nml Nml Nml Nml Nml Nml Nml N/A  Right GluteusMed Nml Nml Nml Nml Nml Nml Nml Nml Nml Nml Nml Nml N/A  Left AntTibialis Nml Nml Nml Nml 1- Mod-V Nml Nml Nml Nml Nml Nml N/A  Left Gastroc Nml Nml Nml Nml Nml Nml Nml Nml Nml Nml Nml Nml N/A  Left Flex Dig Long Nml Nml Nml Nml 1- Mod-V Nml Nml Nml Nml Nml Nml N/A  Left GluteusMed Nml Nml Nml Nml Nml Nml Nml Nml Nml Nml Nml Nml N/A  Left RectFemoris Nml Nml Nml Nml Nml Nml Nml Nml  Nml Nml Nml Nml N/A      Waveforms:

## 2019-09-01 ENCOUNTER — Inpatient Hospital Stay (HOSPITAL_COMMUNITY): Payer: Medicare Other

## 2019-09-01 DIAGNOSIS — R918 Other nonspecific abnormal finding of lung field: Secondary | ICD-10-CM | POA: Diagnosis not present

## 2019-09-01 DIAGNOSIS — R590 Localized enlarged lymph nodes: Secondary | ICD-10-CM | POA: Diagnosis present

## 2019-09-01 DIAGNOSIS — R0602 Shortness of breath: Secondary | ICD-10-CM | POA: Diagnosis not present

## 2019-09-01 DIAGNOSIS — E559 Vitamin D deficiency, unspecified: Secondary | ICD-10-CM | POA: Diagnosis present

## 2019-09-01 DIAGNOSIS — Z8249 Family history of ischemic heart disease and other diseases of the circulatory system: Secondary | ICD-10-CM | POA: Diagnosis not present

## 2019-09-01 DIAGNOSIS — F17200 Nicotine dependence, unspecified, uncomplicated: Secondary | ICD-10-CM | POA: Diagnosis not present

## 2019-09-01 DIAGNOSIS — Z20828 Contact with and (suspected) exposure to other viral communicable diseases: Secondary | ICD-10-CM | POA: Diagnosis present

## 2019-09-01 DIAGNOSIS — Z90710 Acquired absence of both cervix and uterus: Secondary | ICD-10-CM | POA: Diagnosis not present

## 2019-09-01 DIAGNOSIS — G629 Polyneuropathy, unspecified: Secondary | ICD-10-CM | POA: Diagnosis present

## 2019-09-01 DIAGNOSIS — N179 Acute kidney failure, unspecified: Secondary | ICD-10-CM | POA: Diagnosis present

## 2019-09-01 DIAGNOSIS — K769 Liver disease, unspecified: Secondary | ICD-10-CM | POA: Diagnosis not present

## 2019-09-01 DIAGNOSIS — J189 Pneumonia, unspecified organism: Secondary | ICD-10-CM | POA: Diagnosis present

## 2019-09-01 DIAGNOSIS — D649 Anemia, unspecified: Secondary | ICD-10-CM | POA: Diagnosis not present

## 2019-09-01 DIAGNOSIS — I313 Pericardial effusion (noninflammatory): Secondary | ICD-10-CM | POA: Diagnosis present

## 2019-09-01 DIAGNOSIS — I2699 Other pulmonary embolism without acute cor pulmonale: Secondary | ICD-10-CM | POA: Diagnosis not present

## 2019-09-01 DIAGNOSIS — Z841 Family history of disorders of kidney and ureter: Secondary | ICD-10-CM | POA: Diagnosis not present

## 2019-09-01 DIAGNOSIS — I129 Hypertensive chronic kidney disease with stage 1 through stage 4 chronic kidney disease, or unspecified chronic kidney disease: Secondary | ICD-10-CM | POA: Diagnosis present

## 2019-09-01 DIAGNOSIS — Z79899 Other long term (current) drug therapy: Secondary | ICD-10-CM | POA: Diagnosis not present

## 2019-09-01 DIAGNOSIS — D631 Anemia in chronic kidney disease: Secondary | ICD-10-CM | POA: Diagnosis present

## 2019-09-01 DIAGNOSIS — M899 Disorder of bone, unspecified: Secondary | ICD-10-CM | POA: Diagnosis present

## 2019-09-01 DIAGNOSIS — I2693 Single subsegmental pulmonary embolism without acute cor pulmonale: Secondary | ICD-10-CM

## 2019-09-01 DIAGNOSIS — F3340 Major depressive disorder, recurrent, in remission, unspecified: Secondary | ICD-10-CM | POA: Diagnosis present

## 2019-09-01 DIAGNOSIS — F17210 Nicotine dependence, cigarettes, uncomplicated: Secondary | ICD-10-CM | POA: Diagnosis present

## 2019-09-01 DIAGNOSIS — C3411 Malignant neoplasm of upper lobe, right bronchus or lung: Secondary | ICD-10-CM | POA: Diagnosis present

## 2019-09-01 DIAGNOSIS — K7689 Other specified diseases of liver: Secondary | ICD-10-CM | POA: Diagnosis not present

## 2019-09-01 DIAGNOSIS — G8929 Other chronic pain: Secondary | ICD-10-CM | POA: Diagnosis present

## 2019-09-01 DIAGNOSIS — C3490 Malignant neoplasm of unspecified part of unspecified bronchus or lung: Secondary | ICD-10-CM | POA: Diagnosis not present

## 2019-09-01 DIAGNOSIS — N183 Chronic kidney disease, stage 3 unspecified: Secondary | ICD-10-CM | POA: Diagnosis present

## 2019-09-01 DIAGNOSIS — R001 Bradycardia, unspecified: Secondary | ICD-10-CM | POA: Diagnosis not present

## 2019-09-01 DIAGNOSIS — C3491 Malignant neoplasm of unspecified part of right bronchus or lung: Secondary | ICD-10-CM | POA: Diagnosis not present

## 2019-09-01 DIAGNOSIS — I10 Essential (primary) hypertension: Secondary | ICD-10-CM | POA: Diagnosis not present

## 2019-09-01 DIAGNOSIS — M21372 Foot drop, left foot: Secondary | ICD-10-CM | POA: Diagnosis present

## 2019-09-01 DIAGNOSIS — I82403 Acute embolism and thrombosis of unspecified deep veins of lower extremity, bilateral: Secondary | ICD-10-CM | POA: Diagnosis present

## 2019-09-01 LAB — CBC WITH DIFFERENTIAL/PLATELET
Abs Immature Granulocytes: 0.04 10*3/uL (ref 0.00–0.07)
Basophils Absolute: 0 10*3/uL (ref 0.0–0.1)
Basophils Relative: 0 %
Eosinophils Absolute: 0.3 10*3/uL (ref 0.0–0.5)
Eosinophils Relative: 3 %
HCT: 30.1 % — ABNORMAL LOW (ref 36.0–46.0)
Hemoglobin: 9 g/dL — ABNORMAL LOW (ref 12.0–15.0)
Immature Granulocytes: 0 %
Lymphocytes Relative: 35 %
Lymphs Abs: 3.4 10*3/uL (ref 0.7–4.0)
MCH: 25.6 pg — ABNORMAL LOW (ref 26.0–34.0)
MCHC: 29.9 g/dL — ABNORMAL LOW (ref 30.0–36.0)
MCV: 85.8 fL (ref 80.0–100.0)
Monocytes Absolute: 0.6 10*3/uL (ref 0.1–1.0)
Monocytes Relative: 6 %
Neutro Abs: 5.3 10*3/uL (ref 1.7–7.7)
Neutrophils Relative %: 56 %
Platelets: 351 10*3/uL (ref 150–400)
RBC: 3.51 MIL/uL — ABNORMAL LOW (ref 3.87–5.11)
RDW: 18.9 % — ABNORMAL HIGH (ref 11.5–15.5)
WBC: 9.6 10*3/uL (ref 4.0–10.5)
nRBC: 0 % (ref 0.0–0.2)

## 2019-09-01 LAB — BASIC METABOLIC PANEL
Anion gap: 10 (ref 5–15)
BUN: 16 mg/dL (ref 8–23)
CO2: 22 mmol/L (ref 22–32)
Calcium: 8.7 mg/dL — ABNORMAL LOW (ref 8.9–10.3)
Chloride: 108 mmol/L (ref 98–111)
Creatinine, Ser: 1.27 mg/dL — ABNORMAL HIGH (ref 0.44–1.00)
GFR calc Af Amer: 52 mL/min — ABNORMAL LOW (ref >60)
GFR calc non Af Amer: 45 mL/min — ABNORMAL LOW (ref >60)
Glucose, Bld: 89 mg/dL (ref 70–99)
Potassium: 4.2 mmol/L (ref 3.5–5.1)
Sodium: 140 mmol/L (ref 135–145)

## 2019-09-01 LAB — HIV ANTIBODY (ROUTINE TESTING W REFLEX): HIV Screen 4th Generation wRfx: NONREACTIVE

## 2019-09-01 LAB — HEPARIN LEVEL (UNFRACTIONATED)
Heparin Unfractionated: 0.22 IU/mL — ABNORMAL LOW (ref 0.30–0.70)
Heparin Unfractionated: 0.44 IU/mL (ref 0.30–0.70)

## 2019-09-01 MED ORDER — ENSURE ENLIVE PO LIQD
237.0000 mL | Freq: Two times a day (BID) | ORAL | Status: DC
  Administered 2019-09-01 – 2019-09-04 (×5): 237 mL via ORAL

## 2019-09-01 MED ORDER — PRO-STAT SUGAR FREE PO LIQD
30.0000 mL | Freq: Every day | ORAL | Status: DC
  Administered 2019-09-01 – 2019-09-03 (×3): 30 mL via ORAL
  Filled 2019-09-01 (×3): qty 30

## 2019-09-01 MED ORDER — PHENYLEPHRINE HCL 0.25 % NA SOLN: 1.0000 | Freq: Four times a day (QID) | NASAL | Status: DC | PRN

## 2019-09-01 MED ORDER — LIDOCAINE HCL URETHRAL/MUCOSAL 2 % EX GEL
1.0000 "application " | Freq: Once | CUTANEOUS | Status: DC
  Filled 2019-09-01: qty 5

## 2019-09-01 MED ORDER — HEPARIN BOLUS VIA INFUSION
1500.0000 [IU] | Freq: Once | INTRAVENOUS | Status: AC
  Administered 2019-09-01: 1500 [IU] via INTRAVENOUS
  Filled 2019-09-01: qty 1500

## 2019-09-01 MED ORDER — HEPARIN (PORCINE) 25000 UT/250ML-% IV SOLN
1250.0000 [IU]/h | INTRAVENOUS | Status: AC
  Administered 2019-09-01: 1250 [IU]/h via INTRAVENOUS
  Filled 2019-09-01: qty 250

## 2019-09-01 MED ORDER — ADULT MULTIVITAMIN W/MINERALS CH
1.0000 | ORAL_TABLET | Freq: Every day | ORAL | Status: DC
  Administered 2019-09-01 – 2019-09-04 (×4): 1 via ORAL
  Filled 2019-09-01 (×4): qty 1

## 2019-09-01 MED ORDER — BOOST / RESOURCE BREEZE PO LIQD CUSTOM
1.0000 | ORAL | Status: DC
  Administered 2019-09-02 – 2019-09-04 (×3): 1 via ORAL

## 2019-09-01 NOTE — Progress Notes (Signed)
Initial Nutrition Assessment  RD working remotely.   DOCUMENTATION CODES:   Not applicable  INTERVENTION:  - will order Boost Breeze once/day, each supplement provides 250 kcal and 9 grams of protein. - will order Ensure Enlive BID, each supplement provides 350 kcal and 20po  grams of protein. - will order 30 mL Prostat BID, each supplement provides 100 kcal and 15 grams of protein. - will order daily multivitamin with minerals. - continue to encourage PO intakes.   NUTRITION DIAGNOSIS:   Inadequate oral intake related to decreased appetite, acute illness as evidenced by per patient/family report.  GOAL:   Patient will meet greater than or equal to 90% of their needs  MONITOR:   PO intake, Supplement acceptance, Labs, Weight trends  REASON FOR ASSESSMENT:   Malnutrition Screening Tool  ASSESSMENT:   63 y.o. female with medical history significant for neuropathy, depression, HTN, and tobacco abuse. She presented to the ED on 10/20 with slowly progressive dyspnea and cough. This worsened in the few days PTA to the point where she became SOB even with minimal exertion. She also reported ongoing pain in R upper back for several months. She continues to smoke 1/3 pack per day. CT chest was concerning for a large RUL mass thought to be consistent with mediastinal mass or adenopathy and encases R pulmonary arteries and a LLL pulmonary embolism.  Per flow sheet documentation, patient consumed 50% of breakfast this AM. Unable to reach patient via phone at this time.   Per chart review, current weight is 158 lb and weight on 8/28 was 162 lb. This indicates 4 lb weight loss (2.5% body weight) in the past ~2 months. Weight on 5/28 was 172 lb. This indicates 14 lb weight loss (8% body weight) in the past 5 months.  Per notes: - R lung mass and pulmonary embolism - question PNA - mild renal insufficiency - normocytic anemia - current smoker--nicotine patch provided and cessation  encouraged - liver lesions--seen on CT in the ED and non-urgent MRI recommended   Labs reviewed; creatinine: 1.27 mg/dl, Ca: 8.7 mg/dl, GFR: 45 ml/min.  Medications reviewed.      NUTRITION - FOCUSED PHYSICAL EXAM:  unable to complete at this time.   Diet Order:   Diet Order            Diet regular Room service appropriate? Yes; Fluid consistency: Thin  Diet effective now              EDUCATION NEEDS:   No education needs have been identified at this time  Skin:  Skin Assessment: Reviewed RN Assessment  Last BM:  10/19  Height:   Ht Readings from Last 1 Encounters:  08/31/19 5\' 4"  (1.626 m)    Weight:   Wt Readings from Last 1 Encounters:  08/31/19 71.8 kg    Ideal Body Weight:  54.5 kg  BMI:  Body mass index is 27.19 kg/m.  Estimated Nutritional Needs:   Kcal:  1795-2010 kcal  Protein:  75-90 grams  Fluid:  >/= 2 L/day     Jarome Matin, MS, RD, LDN, [facility] Inpatient Clinical Dietitian Pager # [phone] After hours/weekend pager # [phone]

## 2019-09-01 NOTE — Consult Note (Signed)
NAME:  Brianna Saunders, MRN:  [id number], DOB:  25-Nov-1955, LOS: 0 ADMISSION DATE:  08/31/2019, CONSULTATION DATE: 09/01/2019 REFERRING MD:  Tana Coast, CHIEF COMPLAINT: Abnormal CT chest  Brief History   63 year old smoker with hypertension.  Found to have irregular right upper lobe/right hilar mass with possible extension into the LA, small left-sided PE.  Admitted for anticoagulation and tissue  History of present illness   63 year old woman with a history of hypertension, depression, neuropathy.  She is a smoker (approximately 25 pack years).  Presents with several months of progressive dyspnea and a nonproductive cough, worse over the last several days.  No reported sick contacts.  CT-PA 10/21 reviewed, shows small left lower lobe PA pulmonary embolism, a large partially cavitary right upper lobe irregularly shaped mass that extends into the hilar region, encases the right upper lobe PA and appears to extend is a soft tissue lesion into the LA.  There is a small pericardial effusion.  She has been started on heparin, empiric azithromycin and ceftriaxone for possible CAP  Past Medical History   has a past medical history of Chicken pox and Hypertension.  Significant Hospital Events     Consults:    Procedures:    Significant Diagnostic Tests:  CT chest 10/21 >> small left lower lobe PA pulmonary embolism, a large partially cavitary right upper lobe irregularly shaped mass that extends into the hilar region, encases the right upper lobe PA and appears to extend is a soft tissue lesion into the LA.  There is a small pericardial effusion.  Micro Data:  [place] 10/20 >> negative Respiratory 10/20 >>   Antimicrobials:  Azithromycin 10/20 >> Ceftriaxone 10/20 >>   Interim history/subjective:    Objective   Blood pressure 131/82, pulse 60, temperature 98.7 F (37.1 C), temperature source Oral, resp. rate 16, height 5\' 4"  (1.626 m), weight 71.8 kg, SpO2 91 %.         Intake/Output Summary (Last 24 hours) at 09/01/2019 1310 Last data filed at 09/01/2019 0900 Gross per 24 hour  Intake 573.93 ml  Output 300 ml  Net 273.93 ml   Filed Weights   08/31/19 2212  Weight: 71.8 kg    Examination: General: Pleasant woman, sitting up at bedside, no distress HENT: Oropharynx clear, M4 airway Lungs: Clear bilaterally, no rhonchi, no wheezes  Cardiovascular: Regular, intact S1-S2, no murmur Abdomen: Soft, nondistended, positive bowel sounds Extremities: No significant edema Neuro: Awake, alert, nonfocal  Resolved Hospital Problem list     Assessment & Plan:  Complicated right upper lobe and right hilar mass that appears to invade the left atrium (versus left atrial clot?)  and impact the right upper lobe PA.  Consistent with primary lung cancer.  Coexisting small left-sided PE.  -Based on the small size of the pulmonary embolism and the need to quickly facilitate a tissue diagnosis of her right upper lobe mass, I would favor arranging bronchoscopy soon.  I will work on arranging possibly Friday 10/23 after she has completed around 36 hours anticoagulation-need to check with the respiratory department regarding scheduling. -If bronchoscopy not possible or not feasible then could consider biopsy of her liver lesions after they are better characterized by MRI -Agree with empiric antibiotic coverage for CAP given the associated airspace disease, possible postobstructive process  Tobacco abuse, suspected COPD -Clearly would benefit from cessation, will need to pursue counseling -Consider adding albuterol as needed   Labs   CBC: Recent Labs  Lab 08/31/19 1730 09/01/19 0756  WBC 11.4* 9.6  NEUTROABS 8.8* 5.3  HGB 9.1* 9.0*  HCT 31.0* 30.1*  MCV 85.6 85.8  PLT 347 161    Basic Metabolic Panel: Recent Labs  Lab 08/31/19 1745 09/01/19 0756  NA 141 140  K 4.7 4.2  CL 110 108  CO2 21* 22  GLUCOSE 114* 89  BUN 19 16  CREATININE 1.25* 1.27*   CALCIUM 8.8* 8.7*   GFR: Estimated Creatinine Clearance: 43.5 mL/min (A) (by C-G formula based on SCr of 1.27 mg/dL (H)). Recent Labs  Lab 08/31/19 1730 08/31/19 2245 09/01/19 0756  PROCALCITON  --  <0.10  --   WBC 11.4*  --  9.6    Liver Function Tests: Recent Labs  Lab 08/31/19 1730 08/31/19 1745  AST 15 14*  ALT 10 10  ALKPHOS 62 60  BILITOT 0.6 0.4  PROT 7.4 7.5  ALBUMIN 3.4* 3.3*   No results for input(s): LIPASE, AMYLASE in the last 168 hours. No results for input(s): AMMONIA in the last 168 hours.  ABG No results found for: PHART, PCO2ART, PO2ART, HCO3, TCO2, ACIDBASEDEF, O2SAT   Coagulation Profile: Recent Labs  Lab 08/31/19 2245  INR 1.1    Cardiac Enzymes: No results for input(s): CKTOTAL, CKMB, CKMBINDEX, TROPONINI in the last 168 hours.  HbA1C: No results found for: HGBA1C  CBG: No results for input(s): GLUCAP in the last 168 hours.  Review of Systems:     Past Medical History  She,  has a past medical history of Chicken pox and Hypertension.   Surgical History    Past Surgical History:  Procedure Laterality Date  . FRACTURE SURGERY    . PARTIAL HYSTERECTOMY    . SPINE SURGERY     lumbar spine- around 2014  . upper back surgery     states plate in neck and into right shoulder blade reportedly- around 2014     Social History   reports that she has been smoking cigarettes. She started smoking about 49 years ago. She has a 22.50 pack-year smoking history. She has never used smokeless tobacco. She reports that she does not drink alcohol or use drugs.   Family History   Her family history includes Early death in her sister; Heart attack in her maternal grandmother and mother; Hypertension in her father and mother; Kidney disease in her brother and father.   Allergies No Known Allergies   Home Medications  Prior to Admission medications   Medication Sig Start Date End Date Taking? Authorizing Provider  amLODipine (NORVASC) 10 MG  tablet Take 1 tablet (10 mg total) by mouth daily. 03/16/19  Yes Marin Olp, MD  baclofen (LIORESAL) 10 MG tablet TAKE 1 TABLET BY MOUTH THREE TIMES A DAY AS NEEDED FOR MUSCLE SPASMS Patient taking differently: Take 10 mg by mouth 3 (three) times daily.  03/16/19  Yes Marin Olp, MD  buPROPion (WELLBUTRIN XL) 150 MG 24 hr tablet Take 2 tablets (300 mg total) by mouth daily. Patient taking differently: Take 150 mg by mouth daily.  03/16/19  Yes Marin Olp, MD  citalopram (CELEXA) 40 MG tablet Take 1 tablet (40 mg total) by mouth daily. 03/16/19  Yes Marin Olp, MD  gabapentin (NEURONTIN) 300 MG capsule Take 3 capsules (900 mg total) by mouth 3 (three) times daily. 03/16/19  Yes Marin Olp, MD  lisinopril (ZESTRIL) 40 MG tablet Take 1 tablet (40 mg total) by mouth daily. 03/16/19  Yes Marin Olp, MD  metoprolol tartrate (  LOPRESSOR) 50 MG tablet Take 1 tablet (50 mg total) by mouth 2 (two) times daily. 03/16/19  Yes Marin Olp, MD  Vitamin D, Ergocalciferol, (DRISDOL) 1.25 MG (50000 UT) CAPS capsule TAKE 1 CAPSULE (50,000 UNITS TOTAL) BY MOUTH EVERY 7 (SEVEN) DAYS. 08/25/19  Yes Marin Olp, MD     Baltazar Apo, MD, PhD 09/01/2019, 2:13 PM  Pulmonary and Critical Care [phone] or if no answer [phone]

## 2019-09-01 NOTE — Progress Notes (Signed)
Bilateral lower extremity venous duplex has been completed. Preliminary results can be found in CV Proc through chart review.  Results were given to the patient's nurse, Baxter Flattery.  09/01/19 4:23 PM Carlos Levering RVT

## 2019-09-01 NOTE — Progress Notes (Addendum)
ANTICOAGULATION CONSULT NOTE  Pharmacy Consult for Heparin Indication: pulmonary embolus  No Known Allergies  Patient Measurements: Height: 5\' 4"  (162.6 cm) Weight: 158 lb 6.4 oz (71.8 kg) IBW/kg (Calculated) : 54.7 Heparin Dosing Weight: 69.4  Vital Signs: Temp: 98.7 F (37.1 C) (10/21 0655) Temp Source: Oral (10/21 0655) BP: 131/82 (10/21 0655) Pulse Rate: 60 (10/21 0655)  Labs: Recent Labs    08/31/19 1730 08/31/19 1745 08/31/19 2245 09/01/19 0756  HGB 9.1*  --   --  9.0*  HCT 31.0*  --   --  30.1*  PLT 347  --   --  351  APTT  --   --  35  --   LABPROT  --   --  13.8  --   INR  --   --  1.1  --   HEPARINUNFRC  --   --   --  0.22*  CREATININE  --  1.25*  --  1.27*  TROPONINIHS  --   --  4  --     Estimated Creatinine Clearance: 43.5 mL/min (A) (by C-G formula based on SCr of 1.27 mg/dL (H)).  Assessment: Pt is a 63YOF presenting w/ c/o SOB x 2 months. Pt found to have RUL mass w/ mediastinal mass or adenopathy, encasement of pulmonary arteries, pulmonary embolism, possible infection and suspected left atrial invasion. Pharmacy to dose heparin for PE.  Today, 09/01/2019 CBC: 10/20 - Hgb 9.1, low; Plts 347, WNL Heparin Level: 0800 level 0.22 - subtherapeutic, will increase accordingly Recheck heparin level in 6 hours No infusion related issues or bleeding noted by nurse  Goal of Therapy:  Heparin level 0.3-0.7 units/ml Monitor platelets by anticoagulation protocol: Yes  Plan:  Heparin bolus 1500 units IV x 1 Increase heparin drip to 1250 units/hr Daily CBC 6 hour HL    Aliah Eriksson 09/01/2019,9:17 AM

## 2019-09-01 NOTE — H&P (View-Only) (Signed)
NAME:  Brianna Saunders, MRN:  [id number], DOB:  15-Apr-1956, LOS: 0 ADMISSION DATE:  08/31/2019, CONSULTATION DATE: 09/01/2019 REFERRING MD:  Tana Coast, CHIEF COMPLAINT: Abnormal CT chest  Brief History   63 year old smoker with hypertension.  Found to have irregular right upper lobe/right hilar mass with possible extension into the LA, small left-sided PE.  Admitted for anticoagulation and tissue  History of present illness   63 year old woman with a history of hypertension, depression, neuropathy.  She is a smoker (approximately 25 pack years).  Presents with several months of progressive dyspnea and a nonproductive cough, worse over the last several days.  No reported sick contacts.  CT-PA 10/21 reviewed, shows small left lower lobe PA pulmonary embolism, a large partially cavitary right upper lobe irregularly shaped mass that extends into the hilar region, encases the right upper lobe PA and appears to extend is a soft tissue lesion into the LA.  There is a small pericardial effusion.  She has been started on heparin, empiric azithromycin and ceftriaxone for possible CAP  Past Medical History   has a past medical history of Chicken pox and Hypertension.  Significant Hospital Events     Consults:    Procedures:    Significant Diagnostic Tests:  CT chest 10/21 >> small left lower lobe PA pulmonary embolism, a large partially cavitary right upper lobe irregularly shaped mass that extends into the hilar region, encases the right upper lobe PA and appears to extend is a soft tissue lesion into the LA.  There is a small pericardial effusion.  Micro Data:  [place] 10/20 >> negative Respiratory 10/20 >>   Antimicrobials:  Azithromycin 10/20 >> Ceftriaxone 10/20 >>   Interim history/subjective:    Objective   Blood pressure 131/82, pulse 60, temperature 98.7 F (37.1 C), temperature source Oral, resp. rate 16, height 5\' 4"  (1.626 m), weight 71.8 kg, SpO2 91 %.         Intake/Output Summary (Last 24 hours) at 09/01/2019 1310 Last data filed at 09/01/2019 0900 Gross per 24 hour  Intake 573.93 ml  Output 300 ml  Net 273.93 ml   Filed Weights   08/31/19 2212  Weight: 71.8 kg    Examination: General: Pleasant woman, sitting up at bedside, no distress HENT: Oropharynx clear, M4 airway Lungs: Clear bilaterally, no rhonchi, no wheezes  Cardiovascular: Regular, intact S1-S2, no murmur Abdomen: Soft, nondistended, positive bowel sounds Extremities: No significant edema Neuro: Awake, alert, nonfocal  Resolved Hospital Problem list     Assessment & Plan:  Complicated right upper lobe and right hilar mass that appears to invade the left atrium (versus left atrial clot?)  and impact the right upper lobe PA.  Consistent with primary lung cancer.  Coexisting small left-sided PE.  -Based on the small size of the pulmonary embolism and the need to quickly facilitate a tissue diagnosis of her right upper lobe mass, I would favor arranging bronchoscopy soon.  I will work on arranging possibly Friday 10/23 after she has completed around 36 hours anticoagulation-need to check with the respiratory department regarding scheduling. -If bronchoscopy not possible or not feasible then could consider biopsy of her liver lesions after they are better characterized by MRI -Agree with empiric antibiotic coverage for CAP given the associated airspace disease, possible postobstructive process  Tobacco abuse, suspected COPD -Clearly would benefit from cessation, will need to pursue counseling -Consider adding albuterol as needed   Labs   CBC: Recent Labs  Lab 08/31/19 1730 09/01/19 0756  WBC 11.4* 9.6  NEUTROABS 8.8* 5.3  HGB 9.1* 9.0*  HCT 31.0* 30.1*  MCV 85.6 85.8  PLT 347 568    Basic Metabolic Panel: Recent Labs  Lab 08/31/19 1745 09/01/19 0756  NA 141 140  K 4.7 4.2  CL 110 108  CO2 21* 22  GLUCOSE 114* 89  BUN 19 16  CREATININE 1.25* 1.27*   CALCIUM 8.8* 8.7*   GFR: Estimated Creatinine Clearance: 43.5 mL/min (A) (by C-G formula based on SCr of 1.27 mg/dL (H)). Recent Labs  Lab 08/31/19 1730 08/31/19 2245 09/01/19 0756  PROCALCITON  --  <0.10  --   WBC 11.4*  --  9.6    Liver Function Tests: Recent Labs  Lab 08/31/19 1730 08/31/19 1745  AST 15 14*  ALT 10 10  ALKPHOS 62 60  BILITOT 0.6 0.4  PROT 7.4 7.5  ALBUMIN 3.4* 3.3*   No results for input(s): LIPASE, AMYLASE in the last 168 hours. No results for input(s): AMMONIA in the last 168 hours.  ABG No results found for: PHART, PCO2ART, PO2ART, HCO3, TCO2, ACIDBASEDEF, O2SAT   Coagulation Profile: Recent Labs  Lab 08/31/19 2245  INR 1.1    Cardiac Enzymes: No results for input(s): CKTOTAL, CKMB, CKMBINDEX, TROPONINI in the last 168 hours.  HbA1C: No results found for: HGBA1C  CBG: No results for input(s): GLUCAP in the last 168 hours.  Review of Systems:     Past Medical History  She,  has a past medical history of Chicken pox and Hypertension.   Surgical History    Past Surgical History:  Procedure Laterality Date  . FRACTURE SURGERY    . PARTIAL HYSTERECTOMY    . SPINE SURGERY     lumbar spine- around 2014  . upper back surgery     states plate in neck and into right shoulder blade reportedly- around 2014     Social History   reports that she has been smoking cigarettes. She started smoking about 49 years ago. She has a 22.50 pack-year smoking history. She has never used smokeless tobacco. She reports that she does not drink alcohol or use drugs.   Family History   Her family history includes Early death in her sister; Heart attack in her maternal grandmother and mother; Hypertension in her father and mother; Kidney disease in her brother and father.   Allergies No Known Allergies   Home Medications  Prior to Admission medications   Medication Sig Start Date End Date Taking? Authorizing Provider  amLODipine (NORVASC) 10 MG  tablet Take 1 tablet (10 mg total) by mouth daily. 03/16/19  Yes Marin Olp, MD  baclofen (LIORESAL) 10 MG tablet TAKE 1 TABLET BY MOUTH THREE TIMES A DAY AS NEEDED FOR MUSCLE SPASMS Patient taking differently: Take 10 mg by mouth 3 (three) times daily.  03/16/19  Yes Marin Olp, MD  buPROPion (WELLBUTRIN XL) 150 MG 24 hr tablet Take 2 tablets (300 mg total) by mouth daily. Patient taking differently: Take 150 mg by mouth daily.  03/16/19  Yes Marin Olp, MD  citalopram (CELEXA) 40 MG tablet Take 1 tablet (40 mg total) by mouth daily. 03/16/19  Yes Marin Olp, MD  gabapentin (NEURONTIN) 300 MG capsule Take 3 capsules (900 mg total) by mouth 3 (three) times daily. 03/16/19  Yes Marin Olp, MD  lisinopril (ZESTRIL) 40 MG tablet Take 1 tablet (40 mg total) by mouth daily. 03/16/19  Yes Marin Olp, MD  metoprolol tartrate (  LOPRESSOR) 50 MG tablet Take 1 tablet (50 mg total) by mouth 2 (two) times daily. 03/16/19  Yes Marin Olp, MD  Vitamin D, Ergocalciferol, (DRISDOL) 1.25 MG (50000 UT) CAPS capsule TAKE 1 CAPSULE (50,000 UNITS TOTAL) BY MOUTH EVERY 7 (SEVEN) DAYS. 08/25/19  Yes Marin Olp, MD     Baltazar Apo, MD, PhD 09/01/2019, 2:13 PM  Pulmonary and Critical Care [phone] or if no answer [phone]

## 2019-09-01 NOTE — Progress Notes (Signed)
[facility]                                                                              Patient Demographics  Brianna Saunders, is a 63 y.o. female, DOB - 08-18-1956, NTI:[id number]  Admit date - 08/31/2019   Admitting Physician Vianne Bulls, MD  Outpatient Primary MD for the patient is [name], MD  Outpatient specialists:   LOS - 0  days   Medical records reviewed and are as summarized below:    Chief Complaint  Patient presents with   Shortness of Breath       Brief summary   Patient is a 63 year old female with history of neuropathy, depression, hypertension, tobacco use presented with slowly progressive shortness of breath and coughing.  Patient reported months of insidiously worsening dyspnea and nonproductive cough, worsened over the past couple of days to the point that she was so short of breath with minimal exertion.  Patient continues to smoke roughly 1/3 pack/day. In ED, patient was saturating low 90s on room air, tachypneic, stable BP.  CT angiogram of the chest showed large right upper lobe mass that appeared contiguous with mediastinal mass or adenopathy, encases right pulmonary arteries, left lower lobe pulmonary embolism.  Patient was started on IV heparin. COVID-19 test negative  Assessment & Plan    Principal Problem:   Lung mass, right upper lobe, new diagnosis -Patient presented with slowly progressive dyspnea, coughing, found to have large right upper lung mass, with possible infiltrates, associated PE. -Patient was placed on IV heparin, IV Zithromax and Rocephin, placed on O2 as needed -Discussed with Dr. Lamonte Sakai, pulmonology, needs at least 36 hours of IV heparin and then attempted bronchoscopy for biopsy -If bronchoscopy is unsuccessful, will need MRI abdomen for ascertaining liver lesions  Active Problems:  Acute pulmonary embolism -Continue IV heparin drip - obtain Doppler ultrasound of the lower extremities, 2D  echocardiogram    Hypertension -BP at goal, hold lisinopril.  Continue Norvasc and metoprolol    Current smoker -Counseled on smoking cessation, continue nicotine patch    Normocytic anemia -H&H currently stable  Mild AKI -Creatinine 1.25 on admission, baseline 1.0 -Hold lisinopril    Liver lesion, incidentally seen on CTA chest -Obtain nonurgent MRI abdomen.  However if bronchoscopy unsuccessful, will obtain MRI abdomen if needed for biopsy lesions  Depression Continue Celexa, Wellbutrin  Code Status: *full  DVT Prophylaxis: IV heparin drip Family Communication: Discussed all imaging results, lab results, explained to the patient    Disposition Plan: Remains inpatient for work-up of right lung mass, acute PE  Time Spent in minutes 53mins   Procedures:  CT angiogram of the chest  Consultants:   Pulmonology  Antimicrobials:   Anti-infectives (From admission, onward)   Start     Dose/Rate Route Frequency Ordered Stop   08/31/19 2200  cefTRIAXone (ROCEPHIN) 2 g in sodium chloride 0.9 % 100 mL IVPB     2 g 200 mL/hr over 30 Minutes Intravenous Every 24 hours 08/31/19 2152 09/05/19 2159   08/31/19 2200  azithromycin (ZITHROMAX) 500 mg in sodium chloride 0.9 % 250 mL IVPB  500 mg 250 mL/hr over 60 Minutes Intravenous Every 24 hours 08/31/19 2152 09/05/19 2159          Medications  Scheduled Meds:  amLODipine  10 mg Oral Daily   buPROPion  150 mg Oral Daily   citalopram  40 mg Oral Daily   [START ON 09/02/2019] feeding supplement  1 Container Oral Q24H   feeding supplement (ENSURE ENLIVE)  237 mL Oral BID BM   feeding supplement (PRO-STAT SUGAR FREE 64)  30 mL Oral Daily   gabapentin  900 mg Oral TID   metoprolol tartrate  50 mg Oral BID   multivitamin with minerals  1 tablet Oral Daily   nicotine  7 mg Transdermal Daily   sodium chloride flush  3 mL Intravenous Q12H   sodium chloride flush  3 mL Intravenous Q12H   Continuous Infusions:   sodium chloride     azithromycin Stopped (09/01/19 0114)   cefTRIAXone (ROCEPHIN)  IV Stopped (09/01/19 0010)   heparin 1,250 Units/hr (09/01/19 0939)   PRN Meds:.sodium chloride, acetaminophen, baclofen, HYDROcodone-acetaminophen, morphine injection, ondansetron (ZOFRAN) IV, polyethylene glycol, sodium chloride flush      Subjective:   Kelleen Stolze was seen and examined today.  No chest pain.  No significant improvement in the shortness of breath. Patient denies dizziness, abdominal pain, N/V/D/C, new weakness, numbess, tingling. No acute events overnight.    Objective:   Vitals:   09/01/19 0310 09/01/19 0315 09/01/19 0655 09/01/19 1527  BP:   131/82 106/75  Pulse:   60 75  Resp:   16   Temp:   98.7 F (37.1 C)   TempSrc:   Oral   SpO2: (!) 86% 94% 91% 98%  Weight:      Height:        Intake/Output Summary (Last 24 hours) at 09/01/2019 1548 Last data filed at 09/01/2019 0900 Gross per 24 hour  Intake 573.93 ml  Output 300 ml  Net 273.93 ml     Wt Readings from Last 3 Encounters:  08/31/19 71.8 kg  07/09/19 73.9 kg  04/08/19 78 kg     Exam  General: Alert and oriented x 3, NAD  Eyes:   HEENT:  Atraumatic, normocephalic  Cardiovascular: S1 S2 auscultated, no murmurs, RRR  Respiratory: Decreased breath sound at the bases  Gastrointestinal: Soft, nontender, nondistended, + bowel sounds  Ext: no pedal edema bilaterally  Neuro: No new deficits  Musculoskeletal: No digital cyanosis, clubbing  Skin: No rashes  Psych: Normal affect and demeanor, alert and oriented x3    Data Reviewed:  I have personally reviewed following labs and imaging studies  Micro Results Recent Results (from the past 240 hour(s))  SARS Coronavirus 2 by RT PCR (hospital order, performed in [place] hospital lab) Nasopharyngeal Nasopharyngeal Swab     Status: None   Collection Time: 08/31/19  8:22 PM   Specimen: Nasopharyngeal Swab  Result Value Ref Range Status    SARS Coronavirus 2 NEGATIVE NEGATIVE Final    Comment: (NOTE) If result is NEGATIVE SARS-CoV-2 target nucleic acids are NOT DETECTED. The SARS-CoV-2 RNA is generally detectable in upper and lower  respiratory specimens during the acute phase of infection. The lowest  concentration of SARS-CoV-2 viral copies this assay can detect is 250  copies / mL. A negative result does not preclude SARS-CoV-2 infection  and should not be used as the sole basis for treatment or other  patient management decisions.  A negative result may occur with  improper  specimen collection / handling, submission of specimen other  than nasopharyngeal swab, presence of viral mutation(s) within the  areas targeted by this assay, and inadequate number of viral copies  (<250 copies / mL). A negative result must be combined with clinical  observations, patient history, and epidemiological information. If result is POSITIVE SARS-CoV-2 target nucleic acids are DETECTED. The SARS-CoV-2 RNA is generally detectable in upper and lower  respiratory specimens dur ing the acute phase of infection.  Positive  results are indicative of active infection with SARS-CoV-2.  Clinical  correlation with patient history and other diagnostic information is  necessary to determine patient infection status.  Positive results do  not rule out bacterial infection or co-infection with other viruses. If result is PRESUMPTIVE POSTIVE SARS-CoV-2 nucleic acids MAY BE PRESENT.   A presumptive positive result was obtained on the submitted specimen  and confirmed on repeat testing.  While 2019 novel coronavirus  (SARS-CoV-2) nucleic acids may be present in the submitted sample  additional confirmatory testing may be necessary for epidemiological  and / or clinical management purposes  to differentiate between  SARS-CoV-2 and other Sarbecovirus currently known to infect humans.  If clinically indicated additional testing with an alternate test    methodology [phone]) is advised. The SARS-CoV-2 RNA is generally  detectable in upper and lower respiratory sp ecimens during the acute  phase of infection. The expected result is Negative. Fact Sheet for Patients:  StrictlyIdeas.no Fact Sheet for Healthcare Providers: BankingDealers.co.za This test is not yet approved or cleared by the [place] FDA and has been authorized for detection and/or diagnosis of SARS-CoV-2 by FDA under an Emergency Use Authorization (EUA).  This EUA will remain in effect (meaning this test can be used) for the duration of the COVID-19 declaration under Section 564(b)(1) of the Act, 21 U.S.C. section 360bbb-3(b)(1), unless the authorization is terminated or revoked sooner. Performed at [facility], [place] [address], [place], [place] [address]     Radiology Reports Dg Chest 2 View  Result Date: 08/31/2019 CLINICAL DATA:  Chest pain, shortness of breath. EXAM: CHEST - 2 VIEW COMPARISON:  None. FINDINGS: The heart size and mediastinal contours are within normal limits. No pneumothorax or pleural effusion is noted. Left lung is clear. Right midlung and lower lobe opacities are noted concerning for atelectasis or possibly infiltrates. There is noted right perihilar rounded opacity concerning for possible neoplasm. The visualized skeletal structures are unremarkable. IMPRESSION: Right perihilar rounded opacity is noted concerning for possible neoplasm or malignancy. More peripheral opacities are noted concerning for inflammation or possible atelectasis. CT scan of the chest with intravenous contrast is recommended for further evaluation. Electronically Signed   By: Marijo Conception M.D.   On: 08/31/2019 16:55   Ct Chest W Contrast  Result Date: 08/31/2019 CLINICAL DATA:  Abnormal chest x-ray shortness of breath EXAM: CT CHEST WITH CONTRAST TECHNIQUE: Multidetector CT imaging of the chest was  performed during intravenous contrast administration. CONTRAST:  76mL OMNIPAQUE IOHEXOL 300 MG/ML  SOLN COMPARISON:  Chest x-ray 08/31/2019 FINDINGS: Cardiovascular: Nonaneurysmal aorta. Moderate aortic atherosclerosis. No dissection seen. Slightly enlarged pulmonary trunk up to 3.5 cm. Borderline cardiomegaly. Small pericardial effusion measuring up to 14 mm in thickness. 19 mm lobulated filling defect within the right side of the left atrium. Appears contiguous with soft tissue mass in the mediastinum. Coronary vascular calcification. Suspected small nonocclusive filling defect within subsegmental left lower lobe pulmonary artery, series 2, image number 103 and 104. Abduct truncation  of the descending right pulmonary artery with essentially no significant enhancement of right middle or lower lobe pulmonary vessels. Narrowing of right upper lobe pulmonary arterial vessels by mediastinal soft tissue mass. Mediastinum/Nodes: Midline trachea. Subcentimeter hypodense thyroid nodules. Coarse calcifications in the thyroid. Esophagus within normal limits. Enlarged precarinal node measuring 16 mm. Subcarinal node measuring 14 mm. Ill-defined soft tissue density within the right hilus, encasing right upper lobe pulmonary vessels. Lungs/Pleura: Mild emphysema. Partially cavitary mass within the right upper lobe measuring approximately 4.1 cm x 6.4 cm by 3.8 cm. This is contiguous with ill-defined soft tissue mass/adenopathy within the mediastinum 12 mm irregular nodule in the right upper lobe, series 5, image number 49. Ill-defined consolidations within the right upper lobe and the right middle lobe. Diffuse ground-glass density in the right lower lobe with small consolidations. No pleural effusion or pneumothorax. Upper Abdomen: Numerous hypodense liver lesions, some of which are consistent with cysts. Atrophic partially visualized right kidney. Musculoskeletal: Incompletely visualized age indeterminate fracture at the  anterior inferior endplate T12 IMPRESSION: 1. Large poorly defined partially cavitary mass in the right upper lobe measuring approximately 4.1 x 6.4 x 3.8 cm, contiguous with ill-defined mass and/or adenopathy within the mediastinum. 1.9 cm pedunculated filling defect within the right aspect of the left atrium, appears contiguous with mediastinal soft tissue density and is concerning for left atrial invasion. There is narrowing and partial encasement of right upper lobe pulmonary arteries. 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 2. Small nonocclusive thrombus within subsegmental left lower lobe pulmonary artery. 3. 12 mm irregular right upper lobe pulmonary nodule which is either infectious, inflammatory, or neoplastic. Multifocal ill-defined consolidations in the right upper and middle lobes with diffuse ground-glass density in the right lower lobe, findings could be secondary to pneumonia, with consideration given to associated pulmonary infarcts given occluded appearance of right middle and lower lobe pulmonary arteries. 4. Numerous hypodense liver lesions, some of which are consistent with cysts. Given findings in the chest, recommend correlation with nonemergent MRI. 5. Incompletely visualized age indeterminate fracture inferior endplate T12 6. Small pericardial effusion Critical Value/emergent results were called by telephone at the time of interpretation on 08/31/2019 at 7:54 pm to [facility] , who verbally acknowledged these results. Aortic Atherosclerosis (ICD10-I70.0) and Emphysema (ICD10-J43.9). Electronically Signed   By: Donavan Foil M.D.   On: 08/31/2019 19:54    Lab Data:  CBC: Recent Labs  Lab 08/31/19 1730  09/01/19 0756  WBC 11.4* 9.6  NEUTROABS 8.8* 5.3  HGB 9.1* 9.0*  HCT 31.0* 30.1*  MCV 85.6 85.8  PLT 347 474   Basic Metabolic Panel: Recent Labs  Lab 08/31/19 1745 09/01/19 0756  NA 141 140  K 4.7 4.2  CL 110 108  CO2 21* 22  GLUCOSE 114* 89  BUN 19 16  CREATININE 1.25* 1.27*  CALCIUM 8.8* 8.7*   GFR: Estimated Creatinine Clearance: 43.5 mL/min (A) (by C-G formula based on SCr of 1.27 mg/dL (H)). Liver Function Tests: Recent Labs  Lab 08/31/19 1730 08/31/19 1745  AST 15 14*  ALT 10 10  ALKPHOS 62 60  BILITOT 0.6 0.4  PROT 7.4 7.5  ALBUMIN 3.4* 3.3*   No results for input(s): LIPASE, AMYLASE in the last 168 hours. No results for input(s): AMMONIA  in the last 168 hours. Coagulation Profile: Recent Labs  Lab 08/31/19 2245  INR 1.1   Cardiac Enzymes: No results for input(s): CKTOTAL, CKMB, CKMBINDEX, TROPONINI in the last 168 hours. BNP (last 3 results) No results for input(s): PROBNP in the last 8760 hours. HbA1C: No results for input(s): HGBA1C in the last 72 hours. CBG: No results for input(s): GLUCAP in the last 168 hours. Lipid Profile: No results for input(s): CHOL, HDL, LDLCALC, TRIG, CHOLHDL, LDLDIRECT in the last 72 hours. Thyroid Function Tests: No results for input(s): TSH, T4TOTAL, FREET4, T3FREE, THYROIDAB in the last 72 hours. Anemia Panel: No results for input(s): VITAMINB12, FOLATE, FERRITIN, TIBC, IRON, RETICCTPCT in the last 72 hours. Urine analysis: No results found for: COLORURINE, APPEARANCEUR, LABSPEC, PHURINE, GLUCOSEU, HGBUR, BILIRUBINUR, KETONESUR, PROTEINUR, UROBILINOGEN, NITRITE, LEUKOCYTESUR   Dorethy Tomey M.D. [facility] 09/01/2019, 3:48 PM  Pager: [phone] Between 7am to 7pm - call Pager - [phone]  After 7pm go to [email or web address] - password [email or web address]  Call night coverage person covering after 7pm

## 2019-09-01 NOTE — Progress Notes (Signed)
ANTICOAGULATION CONSULT NOTE  Pharmacy Consult for Heparin Indication: pulmonary embolus  No Known Allergies  Patient Measurements: Height: 5\' 4"  (162.6 cm) Weight: 158 lb 6.4 oz (71.8 kg) IBW/kg (Calculated) : 54.7 Heparin Dosing Weight: 69.4  Vital Signs: Temp: 98.7 F (37.1 C) (10/21 0655) Temp Source: Oral (10/21 0655) BP: 106/75 (10/21 1527) Pulse Rate: 75 (10/21 1527)  Labs: Recent Labs    08/31/19 1730 08/31/19 1745 08/31/19 2245 09/01/19 0756 09/01/19 1537  HGB 9.1*  --   --  9.0*  --   HCT 31.0*  --   --  30.1*  --   PLT 347  --   --  351  --   APTT  --   --  35  --   --   LABPROT  --   --  13.8  --   --   INR  --   --  1.1  --   --   HEPARINUNFRC  --   --   --  0.22* 0.44  CREATININE  --  1.25*  --  1.27*  --   TROPONINIHS  --   --  4  --   --     Estimated Creatinine Clearance: 43.5 mL/min (A) (by C-G formula based on SCr of 1.27 mg/dL (H)).  Assessment: Pt is a 63YOF presenting w/ c/o SOB x 2 months. Pt found to have RUL mass w/ mediastinal mass or adenopathy, encasement of pulmonary arteries, pulmonary embolism, possible infection and suspected left atrial invasion. Pharmacy to dose heparin for PE.  Today, 09/01/2019 CBC: 10/20 - Hgb 9.1, low; Plts 347, WNL Heparin Level: up to 0.44 (therapeutic) after 1500 unit bolus and drip increased to 1250 units/hr No infusion related issues or bleeding noted by nurse Heparin to be turned off at 01 am tonight for 07 am bronch by CCM  Goal of Therapy:  Heparin level 0.3-0.7 units/ml Monitor platelets by anticoagulation protocol: Yes  Plan:  continue heparin drip at 1250 units/hr Heparin drip off at 01 am for bronch at 07 am by Dr Fara Olden F/u after bronch for plans to resume Daily CBC, no daily heparin level yet   Eudelia Bunch, Pharm.D [phone] 09/01/2019 4:43 PM

## 2019-09-01 NOTE — Plan of Care (Signed)
  Problem: Education: Goal: Knowledge of General Education information will improve Description: Including pain rating scale, medication(s)/side effects and non-pharmacologic comfort measures Outcome: Progressing   Problem: Health Behavior/Discharge Planning: Goal: Ability to manage health-related needs will improve Outcome: Progressing   Problem: Clinical Measurements: Goal: Ability to maintain clinical measurements within normal limits will improve Outcome: Progressing Goal: Will remain free from infection Outcome: Progressing Goal: Diagnostic test results will improve Outcome: Progressing Goal: Respiratory complications will improve Outcome: Progressing Goal: Cardiovascular complication will be avoided Outcome: Progressing   Problem: Nutrition: Goal: Adequate nutrition will be maintained Outcome: Progressing   Problem: Pain Managment: Goal: General experience of comfort will improve Outcome: Progressing   Problem: Safety: Goal: Ability to remain free from injury will improve Outcome: Progressing   Problem: Skin Integrity: Goal: Risk for impaired skin integrity will decrease Outcome: Progressing

## 2019-09-01 NOTE — Progress Notes (Signed)
Notified by CCMD that pt's O2 dropped to 86%. Reading verified with dynamap. O2 at 2L placed and O2 increased to 94%. Will continue to monitor.

## 2019-09-02 ENCOUNTER — Inpatient Hospital Stay (HOSPITAL_COMMUNITY): Payer: Medicare Other

## 2019-09-02 ENCOUNTER — Encounter (HOSPITAL_COMMUNITY)
Admission: EM | Disposition: A | Discharge status: Home / Self Care | Payer: Self-pay | Source: Home / Self Care | Attending: Internal Medicine

## 2019-09-02 ENCOUNTER — Ambulatory Visit (HOSPITAL_COMMUNITY): Payer: Medicare Other

## 2019-09-02 DIAGNOSIS — K769 Liver disease, unspecified: Secondary | ICD-10-CM

## 2019-09-02 DIAGNOSIS — D649 Anemia, unspecified: Secondary | ICD-10-CM

## 2019-09-02 DIAGNOSIS — R0602 Shortness of breath: Secondary | ICD-10-CM

## 2019-09-02 DIAGNOSIS — I2699 Other pulmonary embolism without acute cor pulmonale: Principal | ICD-10-CM

## 2019-09-02 HISTORY — PX: VIDEO BRONCHOSCOPY: SHX5072

## 2019-09-02 LAB — CBC
HCT: 28.9 % — ABNORMAL LOW (ref 36.0–46.0)
Hemoglobin: 8.7 g/dL — ABNORMAL LOW (ref 12.0–15.0)
MCH: 25.9 pg — ABNORMAL LOW (ref 26.0–34.0)
MCHC: 30.1 g/dL (ref 30.0–36.0)
MCV: 86 fL (ref 80.0–100.0)
Platelets: 328 10*3/uL (ref 150–400)
RBC: 3.36 MIL/uL — ABNORMAL LOW (ref 3.87–5.11)
RDW: 18.7 % — ABNORMAL HIGH (ref 11.5–15.5)
WBC: 8.3 10*3/uL (ref 4.0–10.5)
nRBC: 0 % (ref 0.0–0.2)

## 2019-09-02 LAB — ECHOCARDIOGRAM COMPLETE
Height: 64 in
Weight: 2534.41 oz

## 2019-09-02 LAB — STREP PNEUMONIAE URINARY ANTIGEN: Strep Pneumo Urinary Antigen: NEGATIVE

## 2019-09-02 LAB — HEPARIN LEVEL (UNFRACTIONATED): Heparin Unfractionated: <0.1 IU/mL — ABNORMAL LOW (ref 0.30–0.70)

## 2019-09-02 SURGERY — BRONCHOSCOPY, WITH FLUOROSCOPY
Anesthesia: Moderate Sedation | Laterality: Bilateral

## 2019-09-02 MED ORDER — MIDAZOLAM HCL (PF) 10 MG/2ML IJ SOLN
INTRAMUSCULAR | Status: DC | PRN
  Administered 2019-09-02: 2 mg via INTRAVENOUS
  Administered 2019-09-02: 1 mg via INTRAVENOUS

## 2019-09-02 MED ORDER — LIDOCAINE HCL URETHRAL/MUCOSAL 2 % EX GEL
CUTANEOUS | Status: DC | PRN
  Administered 2019-09-02: 1

## 2019-09-02 MED ORDER — PHENYLEPHRINE HCL 0.25 % NA SOLN
NASAL | Status: DC | PRN
  Administered 2019-09-02: 2 via NASAL

## 2019-09-02 MED ORDER — HEPARIN (PORCINE) 25000 UT/250ML-% IV SOLN
1250.0000 [IU]/h | INTRAVENOUS | Status: DC
  Administered 2019-09-02 – 2019-09-03 (×2): 1250 [IU]/h via INTRAVENOUS
  Filled 2019-09-02: qty 250

## 2019-09-02 MED ORDER — MIDAZOLAM HCL (PF) 5 MG/ML IJ SOLN
INTRAMUSCULAR | Status: AC
  Filled 2019-09-02: qty 2

## 2019-09-02 MED ORDER — SODIUM CHLORIDE 0.9 % IV SOLN: Freq: Once | INTRAVENOUS | Status: DC

## 2019-09-02 MED ORDER — FENTANYL CITRATE (PF) 100 MCG/2ML IJ SOLN
INTRAMUSCULAR | Status: AC
  Filled 2019-09-02: qty 4

## 2019-09-02 MED ORDER — FENTANYL CITRATE (PF) 100 MCG/2ML IJ SOLN
INTRAMUSCULAR | Status: DC | PRN
  Administered 2019-09-02: 25 ug via INTRAVENOUS
  Administered 2019-09-02: 50 ug via INTRAVENOUS

## 2019-09-02 MED ORDER — GADOBUTROL 1 MMOL/ML IV SOLN
7.0000 mL | Freq: Once | INTRAVENOUS | Status: AC | PRN
  Administered 2019-09-02: 7 mL via INTRAVENOUS

## 2019-09-02 MED ORDER — LIDOCAINE HCL 1 % IJ SOLN
INTRAMUSCULAR | Status: DC | PRN
  Administered 2019-09-02: 6 mL via RESPIRATORY_TRACT

## 2019-09-02 MED ORDER — AZITHROMYCIN 250 MG PO TABS
500.0000 mg | ORAL_TABLET | Freq: Every day | ORAL | Status: DC
  Administered 2019-09-02 – 2019-09-03 (×2): 500 mg via ORAL
  Filled 2019-09-02 (×2): qty 2

## 2019-09-02 NOTE — TOC Initial Note (Signed)
Transition of Care [facility]) - Initial/Assessment Note    Patient Details  Name: Brianna Saunders MRN: [id number] Date of Birth: 10/24/1956  [facility] of [facility]) CM/SW Contact:    Dessa Phi, RN Phone Number: 09/02/2019, 2:32 PM  Clinical Narrative:  Referral for benefit check xarelto-await response.                 Expected Discharge Plan: Home/Self Care Barriers to Discharge: Continued Medical Work up   Patient Goals and CMS Choice        Expected Discharge Plan and Services Expected Discharge Plan: Home/Self Care   Discharge Planning Services: CM Consult, Medication Assistance   Living arrangements for the past 2 months: Single Family Home                                      Prior Living Arrangements/Services Living arrangements for the past 2 months: Single Family Home Lives with:: Adult Children Patient language and need for interpreter reviewed:: Yes Do you feel safe going back to the place where you live?: Yes      Need for Family Participation in Patient Care: No (Comment) Care giver support system in place?: Yes (comment)   Criminal Activity/Legal Involvement Pertinent to Current Situation/Hospitalization: No - Comment as needed  Activities of Daily Living Home Assistive Devices/Equipment: Eyeglasses, Environmental consultant (specify type)(four wheel walker) ADL Screening (condition at time of admission) Patient's cognitive ability adequate to safely complete daily activities?: Yes Is the patient deaf or have difficulty hearing?: No Does the patient have difficulty seeing, even when wearing glasses/contacts?: No Does the patient have difficulty concentrating, remembering, or making decisions?: No Patient able to express need for assistance with ADLs?: Yes Does the patient have difficulty dressing or bathing?: No Independently performs ADLs?: Yes (appropriate for developmental age) Does the patient have difficulty walking or climbing stairs?:  Yes Weakness of Legs: Both Weakness of Arms/Hands: None  Permission Sought/Granted Permission sought to share information with : Case Manager Permission granted to share information with : Yes, Verbal Permission Granted  Share Information with NAME: [facility] [phone]     Permission granted to share info w Relationship: dtr Katrina [phone]     Emotional Assessment Appearance:: Appears stated age Attitude/Demeanor/Rapport: Gracious Affect (typically observed): Accepting Orientation: : Oriented to Self, Oriented to Place, Oriented to  Time Alcohol / Substance Use: Not Applicable Psych Involvement: No (comment)  Admission diagnosis:  Malignant neoplasm of right lung, unspecified part of lung (HCC) [C34.91] Acute pulmonary embolism without acute cor pulmonale, unspecified pulmonary embolism type (HCC) [I26.99] Patient Active Problem List   Diagnosis Date Noted  . Lung mass 08/31/2019  . Pulmonary embolism ([place]) 08/31/2019  . Normocytic anemia 08/31/2019  . Mild renal insufficiency 08/31/2019  . Liver lesion 08/31/2019  . Left foot drop 07/09/2019  . Vitamin D deficiency 04/08/2019  . Cervical spinal stenosis 08/27/2018  . Difficulty walking 01/28/2018  . Current smoker 12/18/2017  . Hypertension 11/27/2017  . Chronic pain 11/27/2017  . 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

## 2019-09-02 NOTE — Progress Notes (Signed)
PHARMACIST - PHYSICIAN COMMUNICATION DR:   [place] CONCERNING: Antibiotic IV to Oral Route Change Policy  RECOMMENDATION: This patient is receiving azithromycin by the intravenous route.  Based on criteria approved by the Pharmacy and Therapeutics Committee, the antibiotic(s) is/are being converted to the equivalent oral dose form(s).   DESCRIPTION: These criteria include:  Patient being treated for a respiratory tract infection, urinary tract infection, cellulitis or clostridium difficile associated diarrhea if on metronidazole  The patient is not neutropenic and does not exhibit a GI malabsorption state  The patient is eating (either orally or via tube) and/or has been taking other orally administered medications for a least 24 hours  The patient is improving clinically and has a Tmax < 100.5  If you have questions about this conversion, please contact the Pharmacy Department  []   [phone] )  [facility] []   [phone] )  [facility] []   [phone] )  [facility] []   [phone] )  [facility] [x]   [phone] )  [facility]

## 2019-09-02 NOTE — Progress Notes (Signed)
Held metropolol due to HR being 44. MD made aware.

## 2019-09-02 NOTE — Plan of Care (Signed)

## 2019-09-02 NOTE — Progress Notes (Signed)
[facility]                                                                              Patient Demographics  Brianna Saunders, is a 63 y.o. female, DOB - 07/06/1956, ZOX:[id number]  Admit date - 08/31/2019   Admitting Physician Vianne Bulls, MD  Outpatient Primary MD for the patient is [name], MD  Outpatient specialists:   LOS - 1  days   Medical records reviewed and are as summarized below:    Chief Complaint  Patient presents with   Shortness of Breath       Brief summary   Patient is a 63 year old female with history of neuropathy, depression, hypertension, tobacco use presented with slowly progressive shortness of breath and coughing.  Patient reported months of insidiously worsening dyspnea and nonproductive cough, worsened over the past couple of days to the point that she was so short of breath with minimal exertion.  Patient continues to smoke roughly 1/3 pack/day. In ED, patient was saturating low 90s on room air, tachypneic, stable BP.  CT angiogram of the chest showed large right upper lobe mass that appeared contiguous with mediastinal mass or adenopathy, encases right pulmonary arteries, left lower lobe pulmonary embolism.  Patient was started on IV heparin. COVID-19 test negative  Assessment & Plan    Principal Problem:   Lung mass, right upper lobe, new diagnosis -Patient presented with slowly progressive dyspnea, coughing, found to have large right upper lung mass, with possible infiltrates, associated PE. -Patient was placed on IV heparin, IV Zithromax and Rocephin, placed on O2 as needed - Appreciate Pulmonology assistance, s/p bronch and biopsies by Dr Lamonte Sakai toda -Oncology consulted, discussed with Dr. Maylon Peppers, recommended MRI abdomen triple phase to assess liver lesions given new lung mass, MRI brain to rule out any brain mets.  Will follow patient for further recommendations.  Active Problems:  Acute pulmonary embolism with  bilateral DVTs -Restart heparin drip today, if tolerates and no acute issues, H&H remained stable, will place on Xarelto versus Eliquis pending insurance co-pays -Doppler ultrasound of the lower extremities showed bilateral DVTs -2D echo showed EF of 6065%, normal LVEF, impaired relaxation pattern of LV diastolic filling.    Hypertension -BP stable, continue to hold lisinopril.  Continue Norvasc, metoprolol    Current smoker -Counseled on smoking cessation, continue nicotine patch    Normocytic anemia -H&H currently stable  Mild AKI -Creatinine 1.25 on admission, baseline 1.0 -Recheck bmet    Liver lesion, incidentally seen on CTA chest -Obtaining MRI of the abdomen, triple phase to rule out liver lesions given new lung mass  Depression Continue Celexa, Wellbutrin  Code Status: *full  DVT Prophylaxis: IV heparin drip Family Communication: Discussed all imaging results, lab results, explained to the patient.  Asked the patient about family communication, she stated that she will tell her daughter herself.   Disposition Plan: Remains inpatient for work-up of right lung mass, acute PE  Time Spent in minutes 64mins   Procedures:  CT angiogram of the chest  Consultants:   Pulmonology  Antimicrobials:   Anti-infectives (From admission, onward)   Start  Dose/Rate Route Frequency Ordered Stop   09/02/19 2200  azithromycin (ZITHROMAX) tablet 500 mg     500 mg Oral Daily at bedtime 09/02/19 1057 09/05/19 2159   08/31/19 2200  cefTRIAXone (ROCEPHIN) 2 g in sodium chloride 0.9 % 100 mL IVPB     2 g 200 mL/hr over 30 Minutes Intravenous Every 24 hours 08/31/19 2152 09/05/19 2159   08/31/19 2200  azithromycin (ZITHROMAX) 500 mg in sodium chloride 0.9 % 250 mL IVPB  Status:  Discontinued     500 mg 250 mL/hr over 60 Minutes Intravenous Every 24 hours 08/31/19 2152 09/02/19 1057         Medications  Scheduled Meds:  amLODipine  10 mg Oral Daily   azithromycin  500 mg  Oral QHS   buPROPion  150 mg Oral Daily   citalopram  40 mg Oral Daily   feeding supplement  1 Container Oral Q24H   feeding supplement (ENSURE ENLIVE)  237 mL Oral BID BM   feeding supplement (PRO-STAT SUGAR FREE 64)  30 mL Oral Daily   gabapentin  900 mg Oral TID   metoprolol tartrate  50 mg Oral BID   multivitamin with minerals  1 tablet Oral Daily   nicotine  7 mg Transdermal Daily   sodium chloride flush  3 mL Intravenous Q12H   sodium chloride flush  3 mL Intravenous Q12H   Continuous Infusions:  sodium chloride     sodium chloride     cefTRIAXone (ROCEPHIN)  IV 2 g (09/01/19 2306)   heparin 1,250 Units/hr (09/02/19 1351)   PRN Meds:.sodium chloride, acetaminophen, baclofen, HYDROcodone-acetaminophen, morphine injection, ondansetron (ZOFRAN) IV, polyethylene glycol, sodium chloride flush      Subjective:   Brianna Saunders was seen and examined today.  Denies any chest pain however still has shortness of breath and coughing.   Patient denies dizziness, abdominal pain, N/V/D/C, new weakness, numbess, tingling. No acute events overnight.    Objective:   Vitals:   09/02/19 0810 09/02/19 0825 09/02/19 0830 09/02/19 1000  BP: (!) 134/54 104/69 102/72 108/70  Pulse:    (!) 44  Resp: 19 18 18    Temp:    98 F (36.7 C)  TempSrc:    Oral  SpO2: 100% 97% 95% 93%  Weight:      Height:        Intake/Output Summary (Last 24 hours) at 09/02/2019 1409 Last data filed at 09/02/2019 0600 Gross per 24 hour  Intake 881.08 ml  Output --  Net 881.08 ml     Wt Readings from Last 3 Encounters:  09/02/19 71.9 kg  07/09/19 73.9 kg  04/08/19 78 kg    Physical Exam  General: Alert and oriented x 3, NAD  Eyes:   HEENT:  Atraumatic, normocephalic  Cardiovascular: S1 S2 clear, RRR. No pedal edema b/l  Respiratory: Decreased breath sound at the base  Gastrointestinal: Soft, nontender, nondistended, NBS  Ext: no pedal edema bilaterally  Neuro: no new  deficits  Musculoskeletal: No cyanosis, clubbing  Skin: No rashes  Psych: Normal affect and demeanor, alert and oriented x3     Data Reviewed:  I have personally reviewed following labs and imaging studies  Micro Results Recent Results (from the past 240 hour(s))  SARS Coronavirus 2 by RT PCR (hospital order, performed in [facility] hospital lab) Nasopharyngeal Nasopharyngeal Swab     Status: None   Collection Time: 08/31/19  8:22 PM   Specimen: Nasopharyngeal Swab  Result Value Ref Range  Status   SARS Coronavirus 2 NEGATIVE NEGATIVE Final    Comment: (NOTE) If result is NEGATIVE SARS-CoV-2 target nucleic acids are NOT DETECTED. The SARS-CoV-2 RNA is generally detectable in upper and lower  respiratory specimens during the acute phase of infection. The lowest  concentration of SARS-CoV-2 viral copies this assay can detect is 250  copies / mL. A negative result does not preclude SARS-CoV-2 infection  and should not be used as the sole basis for treatment or other  patient management decisions.  A negative result may occur with  improper specimen collection / handling, submission of specimen other  than nasopharyngeal swab, presence of viral mutation(s) within the  areas targeted by this assay, and inadequate number of viral copies  (<250 copies / mL). A negative result must be combined with clinical  observations, patient history, and epidemiological information. If result is POSITIVE SARS-CoV-2 target nucleic acids are DETECTED. The SARS-CoV-2 RNA is generally detectable in upper and lower  respiratory specimens dur ing the acute phase of infection.  Positive  results are indicative of active infection with SARS-CoV-2.  Clinical  correlation with patient history and other diagnostic information is  necessary to determine patient infection status.  Positive results do  not rule out bacterial infection or co-infection with other viruses. If result is PRESUMPTIVE  POSTIVE SARS-CoV-2 nucleic acids MAY BE PRESENT.   A presumptive positive result was obtained on the submitted specimen  and confirmed on repeat testing.  While 2019 novel coronavirus  (SARS-CoV-2) nucleic acids may be present in the submitted sample  additional confirmatory testing may be necessary for epidemiological  and / or clinical management purposes  to differentiate between  SARS-CoV-2 and other Sarbecovirus currently known to infect humans.  If clinically indicated additional testing with an alternate test  methodology [phone]) is advised. The SARS-CoV-2 RNA is generally  detectable in upper and lower respiratory sp ecimens during the acute  phase of infection. The expected result is Negative. Fact Sheet for Patients:  StrictlyIdeas.no Fact Sheet for Healthcare Providers: BankingDealers.co.za This test is not yet approved or cleared by the [place] FDA and has been authorized for detection and/or diagnosis of SARS-CoV-2 by FDA under an Emergency Use Authorization (EUA).  This EUA will remain in effect (meaning this test can be used) for the duration of the COVID-19 declaration under Section 564(b)(1) of the Act, 21 U.S.C. section 360bbb-3(b)(1), unless the authorization is terminated or revoked sooner. Performed at [facility], [place] [address] [place], [place] [address]     Radiology Reports Dg Chest 2 View  Result Date: 08/31/2019 CLINICAL DATA:  Chest pain, shortness of breath. EXAM: CHEST - 2 VIEW COMPARISON:  None. FINDINGS: The heart size and mediastinal contours are within normal limits. No pneumothorax or pleural effusion is noted. Left lung is clear. Right midlung and lower lobe opacities are noted concerning for atelectasis or possibly infiltrates. There is noted right perihilar rounded opacity concerning for possible neoplasm. The visualized skeletal structures are unremarkable. IMPRESSION:  Right perihilar rounded opacity is noted concerning for possible neoplasm or malignancy. More peripheral opacities are noted concerning for inflammation or possible atelectasis. CT scan of the chest with intravenous contrast is recommended for further evaluation. Electronically Signed   By: Marijo Conception M.D.   On: 08/31/2019 16:55   Ct Chest W Contrast  Result Date: 08/31/2019 CLINICAL DATA:  Abnormal chest x-ray shortness of breath EXAM: CT CHEST WITH CONTRAST TECHNIQUE: Multidetector CT imaging of the chest  was performed during intravenous contrast administration. CONTRAST:  34mL OMNIPAQUE IOHEXOL 300 MG/ML  SOLN COMPARISON:  Chest x-ray 08/31/2019 FINDINGS: Cardiovascular: Nonaneurysmal aorta. Moderate aortic atherosclerosis. No dissection seen. Slightly enlarged pulmonary trunk up to 3.5 cm. Borderline cardiomegaly. Small pericardial effusion measuring up to 14 mm in thickness. 19 mm lobulated filling defect within the right side of the left atrium. Appears contiguous with soft tissue mass in the mediastinum. Coronary vascular calcification. Suspected small nonocclusive filling defect within subsegmental left lower lobe pulmonary artery, series 2, image number 103 and 104. Abduct truncation of the descending right pulmonary artery with essentially no significant enhancement of right middle or lower lobe pulmonary vessels. Narrowing of right upper lobe pulmonary arterial vessels by mediastinal soft tissue mass. Mediastinum/Nodes: Midline trachea. Subcentimeter hypodense thyroid nodules. Coarse calcifications in the thyroid. Esophagus within normal limits. Enlarged precarinal node measuring 16 mm. Subcarinal node measuring 14 mm. Ill-defined soft tissue density within the right hilus, encasing right upper lobe pulmonary vessels. Lungs/Pleura: Mild emphysema. Partially cavitary mass within the right upper lobe measuring approximately 4.1 cm x 6.4 cm by 3.8 cm. This is contiguous with ill-defined soft tissue  mass/adenopathy within the mediastinum 12 mm irregular nodule in the right upper lobe, series 5, image number 49. Ill-defined consolidations within the right upper lobe and the right middle lobe. Diffuse ground-glass density in the right lower lobe with small consolidations. No pleural effusion or pneumothorax. Upper Abdomen: Numerous hypodense liver lesions, some of which are consistent with cysts. Atrophic partially visualized right kidney. Musculoskeletal: Incompletely visualized age indeterminate fracture at the anterior inferior endplate T12 IMPRESSION: 1. Large poorly defined partially cavitary mass in the right upper lobe measuring approximately 4.1 x 6.4 x 3.8 cm, contiguous with ill-defined mass and/or adenopathy within the mediastinum. 1.9 cm pedunculated filling defect within the right aspect of the left atrium, appears contiguous with mediastinal soft tissue density and is concerning for left atrial invasion. There is narrowing and partial encasement of right upper lobe pulmonary arteries. 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 2. Small nonocclusive thrombus within subsegmental left lower lobe pulmonary artery. 3. 12 mm irregular right upper lobe pulmonary nodule which is either infectious, inflammatory, or neoplastic. Multifocal ill-defined consolidations in the right upper and middle lobes with diffuse ground-glass density in the right lower lobe, findings could be secondary to pneumonia, with consideration given to associated pulmonary infarcts given occluded appearance of right middle and lower lobe pulmonary arteries. 4. Numerous hypodense liver lesions, some of which are consistent with cysts. Given findings in the chest, recommend  correlation with nonemergent MRI. 5. Incompletely visualized age indeterminate fracture inferior endplate T12 6. Small pericardial effusion Critical Value/emergent results were called by telephone at the time of interpretation on 08/31/2019 at 7:54 pm to providerELLIOTT [facility] , who verbally acknowledged these results. Aortic Atherosclerosis (ICD10-I70.0) and Emphysema (ICD10-J43.9). Electronically Signed   By: Donavan Foil M.D.   On: 08/31/2019 19:54   Dg Chest Port 1 View  Result Date: 09/02/2019 CLINICAL DATA:  Status post bronchoscopy EXAM: PORTABLE CHEST 1 VIEW COMPARISON:  08/31/2019 FINDINGS: Cardiac shadow is stable. Persistent mass lesion in the right upper lobe is noted adjacent to the right hilum with hilar adenopathy identified. No post bronchoscopy pneumothorax is seen. No other focal abnormality is  seen. IMPRESSION: Stable appearing right lung mass with hilar adenopathy. No post bronchoscopy pneumothorax is seen. Electronically Signed   By: [name] Catalina M.D.   On: 09/02/2019 08:40   Vas [place] Lower Extremity Venous (dvt)  Result Date: 09/01/2019  Lower Venous Study Indications: Pulmonary embolism.  Risk Factors: None identified. Limitations: Poor ultrasound/tissue interface and patient positioning, patient movement. Comparison Study: No prior studies. Performing Technologist: Oliver Hum RVT  Examination Guidelines: A complete evaluation includes B-mode imaging, spectral Doppler, color Doppler, and power Doppler as needed of all accessible portions of each vessel. Bilateral testing is considered an integral part of a complete examination. Limited examinations for reoccurring indications may be performed as noted.  +---------+---------------+---------+-----------+----------+--------------+  RIGHT     Compressibility Phasicity Spontaneity Properties Thrombus Aging  +---------+---------------+---------+-----------+----------+--------------+  CFV       Full            Yes       Yes                                     +---------+---------------+---------+-----------+----------+--------------+  SFJ       Full                                                             +---------+---------------+---------+-----------+----------+--------------+  FV Prox   Full                                                             +---------+---------------+---------+-----------+----------+--------------+  FV Mid    Full                                                             +---------+---------------+---------+-----------+----------+--------------+  FV Distal Full                                                             +---------+---------------+---------+-----------+----------+--------------+  PFV       Full                                                             +---------+---------------+---------+-----------+----------+--------------+  POP       Full            Yes       Yes                                    +---------+---------------+---------+-----------+----------+--------------+  PTV       Partial                                          Acute           +---------+---------------+---------+-----------+----------+--------------+  PERO      Full                                                             +---------+---------------+---------+-----------+----------+--------------+   +---------+---------------+---------+-----------+----------+--------------+  LEFT      Compressibility Phasicity Spontaneity Properties Thrombus Aging  +---------+---------------+---------+-----------+----------+--------------+  CFV       Full            Yes       Yes                                    +---------+---------------+---------+-----------+----------+--------------+  SFJ       Full                                                             +---------+---------------+---------+-----------+----------+--------------+  FV Prox   Full                                                              +---------+---------------+---------+-----------+----------+--------------+  FV Mid    Full                                                             +---------+---------------+---------+-----------+----------+--------------+  FV Distal Full                                                             +---------+---------------+---------+-----------+----------+--------------+  PFV       Full                                                             +---------+---------------+---------+-----------+----------+--------------+  POP       Partial         Yes       Yes                    Acute           +---------+---------------+---------+-----------+----------+--------------+  PTV       Full                                                             +---------+---------------+---------+-----------+----------+--------------+  PERO      Full                                                             +---------+---------------+---------+-----------+----------+--------------+  Gastroc   Full                                                             +---------+---------------+---------+-----------+----------+--------------+  SSV       Full                                                             +---------+---------------+---------+-----------+----------+--------------+     Summary: Right: Findings consistent with acute deep vein thrombosis involving the right posterior tibial veins. No cystic structure found in the popliteal fossa. Left: Findings consistent with acute deep vein thrombosis involving the left popliteal vein. No cystic structure found in the popliteal fossa.  *See table(s) above for measurements and observations.    Preliminary    Dg C-arm Bronchoscopy  Result Date: 09/02/2019 C-ARM BRONCHOSCOPY: Fluoroscopy was utilized by the requesting physician.  No radiographic interpretation.    Lab Data:  CBC: Recent Labs  Lab 08/31/19 1730 09/01/19 0756 09/02/19 0527  WBC 11.4* 9.6 8.3    NEUTROABS 8.8* 5.3  --   HGB 9.1* 9.0* 8.7*  HCT 31.0* 30.1* 28.9*  MCV 85.6 85.8 86.0  PLT 347 351 235   Basic Metabolic Panel: Recent Labs  Lab 08/31/19 1745 09/01/19 0756  NA 141 140  K 4.7 4.2  CL 110 108  CO2 21* 22  GLUCOSE 114* 89  BUN 19 16  CREATININE 1.25* 1.27*  CALCIUM 8.8* 8.7*   GFR: Estimated Creatinine Clearance: 43.5 mL/min (A) (by C-G formula based on SCr of 1.27 mg/dL (H)). Liver Function Tests: Recent Labs  Lab 08/31/19 1730 08/31/19 1745  AST 15 14*  ALT 10 10  ALKPHOS 62 60  BILITOT 0.6 0.4  PROT 7.4 7.5  ALBUMIN 3.4* 3.3*   No results for input(s): LIPASE, AMYLASE in the last 168 hours. No results for input(s): AMMONIA in the last 168 hours. Coagulation Profile: Recent Labs  Lab 08/31/19 2245  INR 1.1   Cardiac Enzymes: No results for input(s): CKTOTAL, CKMB, CKMBINDEX, TROPONINI in the last 168 hours. BNP (last 3 results) No results for input(s): PROBNP in the last 8760 hours. HbA1C: No results for input(s): HGBA1C in the last 72 hours. CBG: No results for input(s): GLUCAP in the last 168 hours. Lipid Profile: No results for input(s):  CHOL, HDL, LDLCALC, TRIG, CHOLHDL, LDLDIRECT in the last 72 hours. Thyroid Function Tests: No results for input(s): TSH, T4TOTAL, FREET4, T3FREE, THYROIDAB in the last 72 hours. Anemia Panel: No results for input(s): VITAMINB12, FOLATE, FERRITIN, TIBC, IRON, RETICCTPCT in the last 72 hours. Urine analysis: No results found for: COLORURINE, APPEARANCEUR, LABSPEC, PHURINE, GLUCOSEU, HGBUR, BILIRUBINUR, KETONESUR, PROTEINUR, UROBILINOGEN, NITRITE, LEUKOCYTESUR   Bernarr Longsworth M.D. [facility] 09/02/2019, 2:09 PM  Pager: [phone] Between 7am to 7pm - call Pager - [phone]  After 7pm go to [email or web address] - password [email or web address]  Call night coverage person covering after 7pm

## 2019-09-02 NOTE — Interval H&P Note (Signed)
PCCM Interval Note  Pt presents for FOB to evaluate R mid lung mass.  No new issues reported.  Heparin was stopped at 1:00 as planned.   Vitals:   09/02/19 0704 09/02/19 0705 09/02/19 0710 09/02/19 0715  BP:  110/83 123/76 121/70  Pulse:      Resp:  12 13 14   Temp:      TempSrc:      SpO2: 100% 97% 100% 100%  Weight:      Height:      comfortable  Lungs clear Heart regular with few periods of irregularity > telemetry with some sinus pauses, no evidence AV block No LE edema  PLan >>> FOB to inspect RLL airways All questions answered, risks and benefits discussed Plan to proceed under conscious sedation.   Baltazar Apo, MD, PhD 09/02/2019, 7:39 AM [facility] [phone] or if no answer [phone]

## 2019-09-02 NOTE — Progress Notes (Signed)
Video bronchoscopy performed.    Intervention bronchial biopsy. Intervention bronchial brushing.    Patient tolerated well.  No complications noted.

## 2019-09-02 NOTE — Progress Notes (Signed)
Pt is back from MRI.

## 2019-09-02 NOTE — Consult Note (Signed)
[place] NOTE  Patient Care Team: Marin Olp, MD as PCP - General (Family Medicine)  HEME/ONC OVERVIEW: 1. Suspected lung cancer   -08/2019: large RUL mass with filling defect in the left atrium (likely left atrial invasion), partial encasement of RUL pulmonary arteries, and hilar, precarinal and subcarinal adenopathy; likely tumor thrombus in the right lung and PTE in the left lung; numerous liver lesions)  ASSESSMENT & PLAN:   Suspected lung cancer   -I reviewed the patient's records in detail, including hospitalization notes, lab studies, and imaging results -I also independently reviewed the radiologic images of recent CT chest, and agree with the findings as documented -In summary, patient presented to [facility] ER for progressive shortness of breath for at least one month.  CTA chest in the ER showed a large RUL mass with a filling defect in the left atrium, likely representing left atrial invasion.  There was partial encasement of the RUL pulmonary arteries, as well as hilar, precarinal, and subcarinal adenopathy.  There was also a likely tumor thrombus in the right lung, as well as nonocclusive PTE in the left lung.  Finally, CT noted numerous liver lesions, some of which likely represent cysts, but malignancy could not be ruled out.  Doppler of the lower extremity showed bilateral DVT.  She underwent bronchoscopy with biopsy of the RUL mass on 09/02/2019, but the pathology is still pending. -I reviewed the imaging results in detail with the patient -In light of patient's extensive tobacco use, this is very concerning for primary lung malignancy. -If malignancy is confirmed, this represents at least cT4N2 disease, which is unresectable. -I recommend obtaining MRI brain with contrast to rule out brain metastases, as well as MRI abdomen without and with contrast to further assessed the liver lesions -Pending the staging studies above, if she has evidence of  metastatic disease, she will also need PD-L1 and molecular studies to determine her treatment options  Acute LLL PTE and bilateral DVT -Patient is currently on heparin drip without significant bleeding or bruising -No evidence of RV strain on CTA chest or echocardiogram -Once the patient completes all staging studies, and there is no further intervention planned, she may be transitioned to an oral anticoagulant  Tobacco abuse -Patient smokes 1 pack every 2 to 3 days for the least past 40 years -I counseled the patient on the importance of smoking cessation, especially in the setting of suspected lung cancer -Patient expressed understanding, and is planning to quit smoking  Normocytic anemia -Most likely multifactorial, hemodilution, anemia of chronic disease, and CKD -Hgb 8.7 today, relatively stable, but lower than admission -Patient denies any symptoms of bleeding -Continue daily CBC with PRN transfusion to keep hemoglobin > 7   Oncology is available as needed. If the patient is discharged over the weekend, we will reach out to the patient to coordinate outpatient follow-up.   [name], MD 09/02/2019 5:32 PM   PURPOSE OF CONSULTATION:  Suspected lung cancer   HISTORY OF PRESENTING ILLNESS:  Brianna Saunders is a 63 year old female with history of hypertension who presented to [facility] ER for progressive shortness of breath and was found with a large right upper lobe mass.  Oncology was consulted for evaluation of suspected lung cancer.  Patient reports that starting about a month ago, she began to notice mild exertional dyspnea, which became progressively worse over time, limiting her ADLs at home.  She smokes 1 pack at least every 2 to 3 days for the past  30 years.  She spoke with her PCP regarding the symptoms, who instructed her to go to the emergency department for further evaluation.  In the ER, CTA chest showed a large right upper lobe mass with suspicion for left atrial invasion, thoracic  adenopathy, tumor thrombus in the right lung and left lower lobe PTE.  She was started on heparin drip, and admitted to the hospitalist service for further management.  REVIEW OF SYSTEMS:   Constitutional: ( - ) fevers, ( - )  chills , ( - ) night sweats Eyes: ( - ) blurriness of vision, ( - ) double vision, ( - ) watery eyes Ears, nose, mouth, throat, and face: ( - ) mucositis, ( - ) sore throat Respiratory: ( - ) cough, ( + ) dyspnea, ( - ) wheezes Cardiovascular: ( - ) palpitation, ( - ) chest discomfort, ( - ) lower extremity swelling Gastrointestinal:  ( - ) nausea, ( - ) heartburn, ( - ) change in bowel habits Skin: ( - ) abnormal skin rashes Lymphatics: ( - ) new lymphadenopathy, ( - ) easy bruising Neurological: ( - ) numbness, ( - ) tingling, ( - ) new weaknesses Behavioral/Psych: ( - ) mood change, ( - ) new changes  All other systems were reviewed with the patient and are negative.  I have reviewed her chart and materials related to her cancer extensively and collaborated history with the patient. Summary of oncologic history is as follows: Oncology History   No history exists.    MEDICAL HISTORY:  Past Medical History:  Diagnosis Date  . Chicken pox   . Hypertension     SURGICAL HISTORY: Past Surgical History:  Procedure Laterality Date  . FRACTURE SURGERY    . PARTIAL HYSTERECTOMY    . SPINE SURGERY     lumbar spine- around 2014  . upper back surgery     states plate in neck and into right shoulder blade reportedly- around 2014    SOCIAL HISTORY: Social History   Socioeconomic History  . Marital status: Single    Spouse name: Not on file  . Number of children: Not on file  . Years of education: Not on file  . Highest education level: Not on file  Occupational History  . Not on file  Social Needs  . Financial resource strain: Not on file  . Food insecurity    Worry: Not on file    Inability: Not on file  . Transportation needs    Medical: Not on file     Non-medical: Not on file  Tobacco Use  . Smoking status: Current Every Day Smoker    Packs/day: 0.50    Years: 45.00    Pack years: 22.50    Types: Cigarettes    Start date: 11/11/1969  . Smokeless tobacco: Never Used  . Tobacco comment: 2 cigarettes a day  Substance and Sexual Activity  . Alcohol use: No    Frequency: Never  . Drug use: No  . Sexual activity: Never  Lifestyle  . Physical activity    Days per week: Not on file    Minutes per session: Not on file  . Stress: Not on file  Relationships  . Social Herbalist on phone: Not on file    Gets together: Not on file    Attends religious service: Not on file    Active member of club or organization: Not on file    Attends meetings of clubs  or organizations: Not on file    Relationship status: Not on file  . Intimate partner violence    Fear of current or ex partner: Not on file    Emotionally abused: Not on file    Physically abused: Not on file    Forced sexual activity: Not on file  Other Topics Concern  . Not on file  Social History Narrative   2LIves with her son and granddaughter- now 66 (often with patient). Also has 1 cat.    Moved to [place] from [place]- slightly closer to family.    Does cleaning/cooking      Disabled due to worsening back/surgeries. 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 Wear and tear from job.    12th grade education      Hobbies: solitaire on laptop, enjoys doing puzzles on the computer    FAMILY HISTORY: Family History  Problem Relation Age of Onset  . Hypertension Mother   . Heart attack Mother        75  . Hypertension Father   . Kidney disease Father        dialysis  . Kidney disease Brother        dialysis  . Heart attack Maternal Grandmother   . Early death Sister        died at 53 months    ALLERGIES:  has No Known Allergies.  MEDICATIONS:  Current Facility-Administered Medications  Medication Dose Route Frequency Provider Last Rate  Last Dose  . 0.9 %  sodium chloride infusion  250 mL Intravenous PRN Collene Gobble, MD      . 0.9 %  sodium chloride infusion   Intravenous Once Collene Gobble, MD      . acetaminophen (TYLENOL) tablet 650 mg  650 mg Oral Q6H PRN Collene Gobble, MD      . amLODipine (NORVASC) tablet 10 mg  10 mg Oral Daily Collene Gobble, MD   10 mg at 09/02/19 1049  . azithromycin (ZITHROMAX) tablet 500 mg  500 mg Oral QHS King, [employer], Student-PharmD      . baclofen (LIORESAL) tablet 10 mg  10 mg Oral TID PRN Collene Gobble, MD   10 mg at 08/31/19 2327  . buPROPion (WELLBUTRIN XL) 24 hr tablet 150 mg  150 mg Oral Daily Collene Gobble, MD   150 mg at 09/02/19 1048  . cefTRIAXone (ROCEPHIN) 2 g in sodium chloride 0.9 % 100 mL IVPB  2 g Intravenous Q24H Collene Gobble, MD 200 mL/hr at 09/01/19 2306 2 g at 09/01/19 2306  . citalopram (CELEXA) tablet 40 mg  40 mg Oral Daily Collene Gobble, MD   40 mg at 09/02/19 1049  . feeding supplement (BOOST / RESOURCE BREEZE) liquid 1 Container  1 Container Oral Q24H Collene Gobble, MD   1 Container at 09/02/19 1347  . feeding supplement (ENSURE ENLIVE) (ENSURE ENLIVE) liquid 237 mL  237 mL Oral BID BM Collene Gobble, MD   237 mL at 09/02/19 1435  . feeding supplement (PRO-STAT SUGAR FREE 64) liquid 30 mL  30 mL Oral Daily Collene Gobble, MD   30 mL at 09/02/19 1711  . gabapentin (NEURONTIN) capsule 900 mg  900 mg Oral TID Collene Gobble, MD   900 mg at 09/02/19 1711  . heparin ADULT infusion 100 units/mL (25000 units/237m sodium chloride 0.45%)  1,250 Units/hr Intravenous Continuous BLeodis SiasT, RPH 12.5 mL/hr at 09/02/19 1351 1,250 Units/hr at  09/02/19 1351  . HYDROcodone-acetaminophen (NORCO/VICODIN) 5-325 MG per tablet 1-2 tablet  1-2 tablet Oral Q4H PRN Collene Gobble, MD      . metoprolol tartrate (LOPRESSOR) tablet 50 mg  50 mg Oral BID Collene Gobble, MD   50 mg at 09/01/19 1011  . morphine 4 MG/ML injection 4 mg  4 mg Intravenous Q4H PRN Collene Gobble, MD      . multivitamin with minerals tablet 1 tablet  1 tablet Oral Daily Collene Gobble, MD   1 tablet at 09/02/19 1047  . nicotine (NICODERM CQ - dosed in mg/24 hr) patch 7 mg  7 mg Transdermal Daily Collene Gobble, MD   7 mg at 09/02/19 1050  . ondansetron (ZOFRAN) injection 4 mg  4 mg Intravenous Q6H PRN Collene Gobble, MD      . polyethylene glycol (MIRALAX / GLYCOLAX) packet 17 g  17 g Oral Daily PRN Collene Gobble, MD      . sodium chloride flush (NS) 0.9 % injection 3 mL  3 mL Intravenous Q12H Collene Gobble, MD   3 mL at 09/02/19 1351  . sodium chloride flush (NS) 0.9 % injection 3 mL  3 mL Intravenous Q12H Collene Gobble, MD   3 mL at 09/02/19 1351  . sodium chloride flush (NS) 0.9 % injection 3 mL  3 mL Intravenous PRN Collene Gobble, MD        PHYSICAL EXAMINATION: ECOG PERFORMANCE STATUS: 1 - Symptomatic but completely ambulatory  Vitals:   09/02/19 1000 09/02/19 1400  BP: 108/70 110/75  Pulse: (!) 44 (!) 55  Resp:  16  Temp: 98 F (36.7 C) 98.7 F (37.1 C)  SpO2: 93% 95%   Filed Weights   08/31/19 2212 09/02/19 0702  Weight: 158 lb 6.4 oz (71.8 kg) 158 lb 6.4 oz (71.9 kg)    GENERAL: alert, no distress and comfortable SKIN: skin color, texture, turgor are normal, no rashes or significant lesions EYES: conjunctiva are pink and non-injected, sclera clear OROPHARYNX: no exudate, no erythema; lips, buccal mucosa, and tongue normal  NECK: supple, non-tender LUNGS: decreased air movement in the right lung, left lung clear to auscultation  HEART: regular rate & rhythm, no murmurs, no lower extremity edema ABDOMEN: soft, non-tender, non-distended, normal bowel sounds Musculoskeletal: no cyanosis of digits and no clubbing  PSYCH: alert & oriented x 3, fluent speech  LABORATORY DATA:  I have reviewed the data as listed Lab Results  Component Value Date   WBC 8.3 09/02/2019   HGB 8.7 (L) 09/02/2019   HCT 28.9 (L) 09/02/2019   MCV 86.0 09/02/2019   PLT 328  09/02/2019   Lab Results  Component Value Date   NA 140 09/01/2019   K 4.2 09/01/2019   CL 108 09/01/2019   CO2 22 09/01/2019    RADIOGRAPHIC STUDIES: I have personally reviewed the radiological images as listed and agreed with the findings in the report. Dg Chest 2 View  Result Date: 08/31/2019 CLINICAL DATA:  Chest pain, shortness of breath. EXAM: CHEST - 2 VIEW COMPARISON:  None. FINDINGS: The heart size and mediastinal contours are within normal limits. No pneumothorax or pleural effusion is noted. Left lung is clear. Right midlung and lower lobe opacities are noted concerning for atelectasis or possibly infiltrates. There is noted right perihilar rounded opacity concerning for possible neoplasm. The visualized skeletal structures are unremarkable. IMPRESSION: Right perihilar rounded opacity is noted concerning for possible neoplasm or  malignancy. More peripheral opacities are noted concerning for inflammation or possible atelectasis. CT scan of the chest with intravenous contrast is recommended for further evaluation. Electronically Signed   By: Marijo Conception M.D.   On: 08/31/2019 16:55   Ct Chest W Contrast  Result Date: 08/31/2019 CLINICAL DATA:  Abnormal chest x-ray shortness of breath EXAM: CT CHEST WITH CONTRAST TECHNIQUE: Multidetector CT imaging of the chest was performed during intravenous contrast administration. CONTRAST:  65m OMNIPAQUE IOHEXOL 300 MG/ML  SOLN COMPARISON:  Chest x-ray 08/31/2019 FINDINGS: Cardiovascular: Nonaneurysmal aorta. Moderate aortic atherosclerosis. No dissection seen. Slightly enlarged pulmonary trunk up to 3.5 cm. Borderline cardiomegaly. Small pericardial effusion measuring up to 14 mm in thickness. 19 mm lobulated filling defect within the right side of the left atrium. Appears contiguous with soft tissue mass in the mediastinum. Coronary vascular calcification. Suspected small nonocclusive filling defect within subsegmental left lower lobe pulmonary  artery, series 2, image number 103 and 104. Abduct truncation of the descending right pulmonary artery with essentially no significant enhancement of right middle or lower lobe pulmonary vessels. Narrowing of right upper lobe pulmonary arterial vessels by mediastinal soft tissue mass. Mediastinum/Nodes: Midline trachea. Subcentimeter hypodense thyroid nodules. Coarse calcifications in the thyroid. Esophagus within normal limits. Enlarged precarinal node measuring 16 mm. Subcarinal node measuring 14 mm. Ill-defined soft tissue density within the right hilus, encasing right upper lobe pulmonary vessels. Lungs/Pleura: Mild emphysema. Partially cavitary mass within the right upper lobe measuring approximately 4.1 cm x 6.4 cm by 3.8 cm. This is contiguous with ill-defined soft tissue mass/adenopathy within the mediastinum 12 mm irregular nodule in the right upper lobe, series 5, image number 49. Ill-defined consolidations within the right upper lobe and the right middle lobe. Diffuse ground-glass density in the right lower lobe with small consolidations. No pleural effusion or pneumothorax. Upper Abdomen: Numerous hypodense liver lesions, some of which are consistent with cysts. Atrophic partially visualized right kidney. Musculoskeletal: Incompletely visualized age indeterminate fracture at the anterior inferior endplate T12 IMPRESSION: 1. Large poorly defined partially cavitary mass in the right upper lobe measuring approximately 4.1 x 6.4 x 3.8 cm, contiguous with ill-defined mass and/or adenopathy within the mediastinum. 1.9 cm pedunculated filling defect within the right aspect of the left atrium, appears contiguous with mediastinal soft tissue density and is concerning for left atrial invasion. There is narrowing and partial encasement of right upper lobe pulmonary arteries. In addition there is truncation of the right pulmonary artery with no significant enhancement of right descending pulmonary artery, right  middle or lower lobe pulmonary arterial vessels; this is presumed secondary to tumor occlusion with probable thrombi within right middle and lower lobe pulmonary vessels. 2. Small nonocclusive thrombus within subsegmental left lower lobe pulmonary artery. 3. 12 mm irregular right upper lobe pulmonary nodule which is either infectious, inflammatory, or neoplastic. Multifocal ill-defined consolidations in the right upper and middle lobes with diffuse ground-glass density in the right lower lobe, findings could be secondary to pneumonia, with consideration given to associated pulmonary infarcts given occluded appearance of right middle and lower lobe pulmonary arteries. 4. Numerous hypodense liver lesions, some of which are consistent with cysts. Given findings in the chest, recommend correlation with nonemergent MRI. 5. Incompletely visualized age indeterminate fracture inferior endplate T12 6. Small pericardial effusion Critical Value/emergent results were called by telephone at the time of interpretation on 08/31/2019 at 7:54 pm to providerELLIOTT [facility], who verbally acknowledged these results. Aortic Atherosclerosis (ICD10-I70.0) and Emphysema (ICD10-J43.9). Electronically Signed  By: Donavan Foil M.D.   On: 08/31/2019 19:54   Dg Chest Port 1 View  Result Date: 09/02/2019 CLINICAL DATA:  Status post bronchoscopy EXAM: PORTABLE CHEST 1 VIEW COMPARISON:  08/31/2019 FINDINGS: Cardiac shadow is stable. Persistent mass lesion in the right upper lobe is noted adjacent to the right hilum with hilar adenopathy identified. No post bronchoscopy pneumothorax is seen. No other focal abnormality is seen. IMPRESSION: Stable appearing right lung mass with hilar adenopathy. No post bronchoscopy pneumothorax is seen. Electronically Signed   By: [name] Catalina M.D.   On: 09/02/2019 08:40   Vas [place] Lower Extremity Venous (dvt)  Result Date: 09/02/2019  Lower Venous Study Indications: Pulmonary embolism.  Risk Factors: None  identified. Limitations: Poor ultrasound/tissue interface and patient positioning, patient movement. Comparison Study: No prior studies. Performing Technologist: Oliver Hum RVT  Examination Guidelines: A complete evaluation includes B-mode imaging, spectral Doppler, color Doppler, and power Doppler as needed of all accessible portions of each vessel. Bilateral testing is considered an integral part of a complete examination. Limited examinations for reoccurring indications may be performed as noted.  +---------+---------------+---------+-----------+----------+--------------+ RIGHT    CompressibilityPhasicitySpontaneityPropertiesThrombus Aging +---------+---------------+---------+-----------+----------+--------------+ CFV      Full           Yes      Yes                                 +---------+---------------+---------+-----------+----------+--------------+ SFJ      Full                                                        +---------+---------------+---------+-----------+----------+--------------+ FV Prox  Full                                                        +---------+---------------+---------+-----------+----------+--------------+ FV Mid   Full                                                        +---------+---------------+---------+-----------+----------+--------------+ FV DistalFull                                                        +---------+---------------+---------+-----------+----------+--------------+ PFV      Full                                                        +---------+---------------+---------+-----------+----------+--------------+ POP      Full           Yes      Yes                                 +---------+---------------+---------+-----------+----------+--------------+  PTV      Partial                                      Acute           +---------+---------------+---------+-----------+----------+--------------+ PERO     Full                                                        +---------+---------------+---------+-----------+----------+--------------+   +---------+---------------+---------+-----------+----------+--------------+ LEFT     CompressibilityPhasicitySpontaneityPropertiesThrombus Aging +---------+---------------+---------+-----------+----------+--------------+ CFV      Full           Yes      Yes                                 +---------+---------------+---------+-----------+----------+--------------+ SFJ      Full                                                        +---------+---------------+---------+-----------+----------+--------------+ FV Prox  Full                                                        +---------+---------------+---------+-----------+----------+--------------+ FV Mid   Full                                                        +---------+---------------+---------+-----------+----------+--------------+ FV DistalFull                                                        +---------+---------------+---------+-----------+----------+--------------+ PFV      Full                                                        +---------+---------------+---------+-----------+----------+--------------+ POP      Partial        Yes      Yes                  Acute          +---------+---------------+---------+-----------+----------+--------------+ PTV      Full                                                        +---------+---------------+---------+-----------+----------+--------------+  PERO     Full                                                        +---------+---------------+---------+-----------+----------+--------------+ Gastroc  Full                                                         +---------+---------------+---------+-----------+----------+--------------+ SSV      Full                                                        +---------+---------------+---------+-----------+----------+--------------+     Summary: Right: Findings consistent with acute deep vein thrombosis involving the right posterior tibial veins. No cystic structure found in the popliteal fossa. Left: Findings consistent with acute deep vein thrombosis involving the left popliteal vein. No cystic structure found in the popliteal fossa.  *See table(s) above for measurements and observations. Electronically signed by Servando Snare MD on 09/02/2019 at 2:17:01 PM.    Final    Dg C-arm Bronchoscopy  Result Date: 09/02/2019 C-ARM BRONCHOSCOPY: Fluoroscopy was utilized by the requesting physician.  No radiographic interpretation.    PATHOLOGY: I have reviewed the pathology reports as documented in the oncologist history.

## 2019-09-02 NOTE — Progress Notes (Signed)
Pt is on her way for MRI (head)

## 2019-09-02 NOTE — Progress Notes (Signed)
  Echocardiogram 2D Echocardiogram has been performed.  Brianna Saunders 09/02/2019, 1:21 PM

## 2019-09-02 NOTE — TOC Benefit Eligibility Note (Signed)
Transition of Care [facility]) Benefit Eligibility Note    Patient Details  Name: Brianna Saunders MRN: [id number] Date of Birth: 27-Nov-1955   Medication/Dose: Xarelto 16m po 2x day for 30days  Covered?: Yes  Tier: 3 Drug  Prescription Coverage Preferred Pharmacy: local [facility]  Spoke with Person/Company/Phone Number:: Automated System / [employer] [phone] Co-Pay: $18.00  Prior Approval: No  Deductible: Met  Additional Notes: Generic not available    FKerin SalenPhone Number: 09/02/2019, 2:54 PM

## 2019-09-02 NOTE — Progress Notes (Signed)
ANTICOAGULATION CONSULT NOTE  Pharmacy Consult for Heparin Indication: pulmonary embolus  No Known Allergies  Patient Measurements: Height: 5\' 4"  (162.6 cm) Weight: 158 lb 6.4 oz (71.9 kg) IBW/kg (Calculated) : 54.7 Heparin Dosing Weight: 69.4  Vital Signs: Temp: 99 F (37.2 C) (10/22 0702) Temp Source: Oral (10/22 0702) BP: 102/72 (10/22 0830) Pulse Rate: 63 (10/22 0702)  Labs: Recent Labs    08/31/19 1730 08/31/19 1745 08/31/19 2245 09/01/19 0756 09/01/19 1537 09/02/19 0527  HGB 9.1*  --   --  9.0*  --  8.7*  HCT 31.0*  --   --  30.1*  --  28.9*  PLT 347  --   --  351  --  328  APTT  --   --  35  --   --   --   LABPROT  --   --  13.8  --   --   --   INR  --   --  1.1  --   --   --   HEPARINUNFRC  --   --   --  0.22* 0.44  --   CREATININE  --  1.25*  --  1.27*  --   --   TROPONINIHS  --   --  4  --   --   --     Estimated Creatinine Clearance: 43.5 mL/min (A) (by C-G formula based on SCr of 1.27 mg/dL (H)).  Assessment: Pt is a 63YOF presenting w/ c/o SOB x 2 months. Pt found to have RUL mass w/ mediastinal mass or adenopathy, encasement of pulmonary arteries, pulmonary embolism, possible infection and suspected left atrial invasion. Pharmacy to dose heparin for PE.  Today, 09/02/2019 Heparin drip turned off at 0125 am for bronchoscopy S/p bronch - heparin to be resumed at 1400 per Dr Lamonte Sakai Hg 8.7 - stable, PLTC WNL,  No bleeding reported  Goal of Therapy:  Heparin level 0.3-0.7 units/ml Monitor platelets by anticoagulation protocol: Yes  Plan:  resume heparin drip at 1250 units/hr at 1400 today per Dr Lamonte Sakai 8 hr heparin level at 2200 tonight Daily CBC, no daily heparin level yet   Eudelia Bunch, Pharm.D [phone] 09/02/2019 10:13 AM

## 2019-09-02 NOTE — Op Note (Signed)
[facility] Cardiopulmonary Patient Name: Brianna Saunders Procedure Date: 09/02/2019 MRN: [id number] Attending MD: Collene Gobble , MD Date of Birth: May 15, 1956 CSN: [id number] Age: 63 Admit Type: Inpatient Ethnicity: Unknown Procedure:            Bronchoscopy Indications:          Right upper lobe mass Providers:            Collene Gobble, MD, Phillis Knack RRT, RCP, Kelby Fam RRT,RCP Referring MD:          Medicines:            Midazolam 3 mg mg IV, Fentanyl 75 mcg IV Complications:        No immediate complications Estimated Blood Loss: Estimated blood loss was minimal. Procedure:      Pre-Anesthesia Assessment:      - A History and Physical has been performed. Patient meds and allergies       have been reviewed. The risks and benefits of the procedure and the       sedation options and risks were discussed with the patient. All       questions were answered and informed consent was obtained. Patient       identification and proposed procedure were verified prior to the       procedure by the physician in the procedure room. Mental Status       Examination: normal. Airway Examination: normal oropharyngeal airway.       Respiratory Examination: clear to auscultation in the left lung and poor       air movement in the right lung. CV Examination: normal. Prior       Anticoagulants: The patient has taken heparin, last dose was 0100 on am       of procedure. ASA Grade Assessment: II - A patient with mild systemic       disease. After reviewing the risks and benefits, the patient was deemed       in satisfactory condition to undergo the procedure. The anesthesia plan       was to use moderate sedation / analgesia (conscious sedation).       Immediately prior to administration of medications, the patient was       re-assessed for adequacy to receive sedatives. The heart rate,       respiratory rate, oxygen saturations, blood pressure,  adequacy of       pulmonary ventilation, and response to care were monitored throughout       the procedure. The physical status of the patient was re-assessed after       the procedure.      After obtaining informed consent, the bronchoscope was passed under       direct vision. Throughout the procedure, the patient's blood pressure,       pulse, and oxygen saturations were monitored continuously. the BF-H190       ([id number]) Olympus Bronchoscope was introduced through the right nostril       and advanced to the tracheobronchial tree. The procedure was       accomplished without difficulty. The patient tolerated the procedure       fairly well. The total duration of the procedure was 25 minutes. Total       fluoroscopy time was 55 seconds. Findings:  The nasopharynx/oropharynx appears normal. The larynx appears normal.       The vocal cords appear normal. The subglottic space is normal. The       trachea is of normal caliber. The carina is sharp. The tracheobronchial       tree of the left lung was examined to at least the first subsegmental       level. Bronchial mucosa and anatomy in the left lung are normal; there       are no endobronchial lesions, and no secretions.      Right Lung Abnormalities: Areas of inflamed mucosa were found in the       right mainstem bronchus and in the anterior segment of the right upper       lobe (B3). Severe thickening of the mucosa was found in the anterior       segment of the right upper lobe (B3). Neovascular mucosa was found in       the anterior segment of the right upper lobe (B3). Narrowing was found       in the anterior segment of the right upper lobe (B3). The lesion has a       malignant appearance. The airway lumen is about 75% occluded. The lesion       was not traversed. Fluoroscopy guided transbronchial brushings were       obtained in the anterior segment of the right upper lobe with a cytology       brush and sent for routine  cytology. Three samples were obtained.       Transbronchial brushing technique was selected because the sampling site       was not accessible using standard endoscopic (bronchoscopic) techniques.       Transbronchial biopsies were performed in the anterior segment of the       right upper lobe using forceps and sent for histopathology examination.       The procedure was guided by fluoroscopy. Transbronchial biopsy technique       was selected because the sampling site was not accessible using standard       endoscopic (bronchoscopic) techniques. One biopsy pass was performed.       One biopsy sample was obtained. Impression:      - Right upper lobe mass      - The airway examination of the left lung was normal.      - Mucosal inflammation was visualized in the right mainstem bronchus and       in the anterior segment of the right upper lobe (B3).      - Thickening of the mucosa was found in the anterior segment of the       right upper lobe (B3).      - Neovascular mucosa was found in the anterior segment of the right       upper lobe (B3).      - A narrowing was found in the anterior segment of the right upper lobe       (B3). The lesion has a malignant appearance.      - Transbronchial brushings were obtained in the RUL.      - Transbronchial lung biopsies were performed in the RUL. Moderate Sedation:      Moderate (conscious) sedation was personally administered by the       pulmonologist. The following parameters were monitored: oxygen       saturation, heart rate, blood pressure, respiratory rate, EKG, adequacy  of pulmonary ventilation, and response to care. Total physician       intraservice time was 27 minutes. Recommendation:      - Await biopsy and brushing results.      - Ok to restart heparin drip at 14:00 on 09/02/2019, follow for any       evidence bleeding Procedure Code(s):      --- Professional ---      [phone], Bronchoscopy, rigid or flexible, including  fluoroscopic guidance,       when performed; with transbronchial lung biopsy(s), single lobe      31623, Bronchoscopy, rigid or flexible, including fluoroscopic guidance,       when performed; with brushing or protected brushings      99152, Moderate sedation services provided by the same physician or       other qualified health care professional performing the diagnostic or       therapeutic service that the sedation supports, requiring the presence       of an independent trained observer to assist in the monitoring of the       patient's level of consciousness and physiological status; initial 15       minutes of intraservice time, patient age 60 years or older      [phone], Moderate sedation; each additional 15 minutes intraservice time Diagnosis Code(s):      --- Professional ---      R91.8, Other nonspecific abnormal finding of lung field      J40, Bronchitis, not specified as acute or chronic      J18.9, Pneumonia, unspecified organism      J98.4, Other disorders of lung CPT copyright 2019 [employer]. All rights reserved. The codes documented in this report are preliminary and upon coder review may  be revised to meet current compliance requirements. Collene Gobble, MD Collene Gobble, MD 09/02/2019 8:48:51 AM Number of Addenda: 0 Scope In: 5:20:80 AM Scope Out: 8:04:58 AM

## 2019-09-03 ENCOUNTER — Inpatient Hospital Stay (HOSPITAL_COMMUNITY): Payer: Medicare Other

## 2019-09-03 ENCOUNTER — Encounter (HOSPITAL_COMMUNITY): Payer: Self-pay | Admitting: Emergency Medicine

## 2019-09-03 LAB — CBC
HCT: 33.7 % — ABNORMAL LOW (ref 36.0–46.0)
Hemoglobin: 10.1 g/dL — ABNORMAL LOW (ref 12.0–15.0)
MCH: 25.6 pg — ABNORMAL LOW (ref 26.0–34.0)
MCHC: 30 g/dL (ref 30.0–36.0)
MCV: 85.5 fL (ref 80.0–100.0)
Platelets: 362 10*3/uL (ref 150–400)
RBC: 3.94 MIL/uL (ref 3.87–5.11)
RDW: 18.4 % — ABNORMAL HIGH (ref 11.5–15.5)
WBC: 9.3 10*3/uL (ref 4.0–10.5)
nRBC: 0 % (ref 0.0–0.2)

## 2019-09-03 LAB — HEPARIN LEVEL (UNFRACTIONATED)
Heparin Unfractionated: 0.16 IU/mL — ABNORMAL LOW (ref 0.30–0.70)
Heparin Unfractionated: 0.33 IU/mL (ref 0.30–0.70)

## 2019-09-03 LAB — SURGICAL PATHOLOGY

## 2019-09-03 LAB — CYTOLOGY - NON PAP

## 2019-09-03 MED ORDER — HEPARIN (PORCINE) 25000 UT/250ML-% IV SOLN: 1500.0000 [IU]/h | INTRAVENOUS | Status: DC

## 2019-09-03 MED ORDER — HEPARIN (PORCINE) 25000 UT/250ML-% IV SOLN
1450.0000 [IU]/h | INTRAVENOUS | Status: DC
  Administered 2019-09-03: 1450 [IU]/h via INTRAVENOUS
  Filled 2019-09-03: qty 250

## 2019-09-03 MED ORDER — HEPARIN BOLUS VIA INFUSION
2000.0000 [IU] | Freq: Once | INTRAVENOUS | Status: AC
  Administered 2019-09-03: 2000 [IU] via INTRAVENOUS
  Filled 2019-09-03: qty 2000

## 2019-09-03 MED ORDER — GADOBUTROL 1 MMOL/ML IV SOLN
7.0000 mL | Freq: Once | INTRAVENOUS | Status: AC | PRN
  Administered 2019-09-03: 18:00:00 7 mL via INTRAVENOUS

## 2019-09-03 MED ORDER — HYDROCODONE-HOMATROPINE 5-1.5 MG/5ML PO SYRP: 5.0000 mL | ORAL_SOLUTION | Freq: Four times a day (QID) | ORAL | Status: DC | PRN

## 2019-09-03 MED ORDER — BENZONATATE 100 MG PO CAPS
100.0000 mg | ORAL_CAPSULE | Freq: Three times a day (TID) | ORAL | Status: DC
  Administered 2019-09-03 – 2019-09-04 (×4): 100 mg via ORAL
  Filled 2019-09-03 (×4): qty 1

## 2019-09-03 NOTE — Progress Notes (Signed)
NAME:  Brianna Saunders, MRN:  [id number], DOB:  1956-07-18, LOS: 2 ADMISSION DATE:  08/31/2019, CONSULTATION DATE: 09/01/2019 REFERRING MD:  Tana Coast, CHIEF COMPLAINT: Abnormal CT chest  Brief History   63 year old smoker with hypertension.  Found to have irregular right upper lobe/right hilar mass with possible extension into the LA, small left-sided PE.  Admitted for anticoagulation and tissue  History of present illness   63 year old woman with a history of hypertension, depression, neuropathy.  She is a smoker (approximately 25 pack years).  Presents with several months of progressive dyspnea and a nonproductive cough, worse over the last several days.  No reported sick contacts.  CT-PA 10/21 reviewed, shows small left lower lobe PA pulmonary embolism, a large partially cavitary right upper lobe irregularly shaped mass that extends into the hilar region, encases the right upper lobe PA and appears to extend is a soft tissue lesion into the LA.  There is a small pericardial effusion.  She has been started on heparin, empiric azithromycin and ceftriaxone for possible CAP  Past Medical History   has a past medical history of Chicken pox and Hypertension.  Significant Hospital Events     Consults:    Procedures:    Significant Diagnostic Tests:  CT chest 10/21 >> small left lower lobe PA pulmonary embolism, a large partially cavitary right upper lobe irregularly shaped mass that extends into the hilar region, encases the right upper lobe PA and appears to extend is a soft tissue lesion into the LA.  There is a small pericardial effusion.  Bronchoscopy with transbronchial brushings 10/22 >> squamous cell lung cancer  Micro Data:  Assumption 10/20 >> negative Respiratory 10/20 >>   Antimicrobials:  Azithromycin 10/20 >> Ceftriaxone 10/20 >>   Interim history/subjective:  No difficulty post bronchoscopy on 10/22, no significant bleeding Heparin reinitiated  Objective   Blood  pressure 99/66, pulse (!) 59, temperature 98.2 F (36.8 C), temperature source Oral, resp. rate 15, height '5\' 4"'$  (1.626 m), weight 71.9 kg, SpO2 94 %.        Intake/Output Summary (Last 24 hours) at 09/03/2019 1409 Last data filed at 09/03/2019 0600 Gross per 24 hour  Intake 868.49 ml  Output 800 ml  Net 68.49 ml   Filed Weights   08/31/19 2212 09/02/19 0702  Weight: 71.8 kg 71.9 kg    Examination: General: Lying in bed, comfortable HENT: Oropharynx clear, M4 airway Lungs: Clear bilaterally Cardiovascular: Regular, no murmur Abdomen: Soft, nondistended, positive bowel sounds Extremities: No edema Neuro: Awake, alert, appropriate, moves all extremities  Resolved Hospital Problem list     Assessment & Plan:  Complicated right upper lobe and right hilar mass that appears to invade the left atrium (versus left atrial clot?)  and impact the right upper lobe PA.  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  She has a coexisting small left-sided PE.  -Appreciate oncology evaluation, next steps in therapy as per Dr Maylon Peppers.  I was only able to create slides from her right upper lobe mass via transbronchial brushings, unclear as to whether there will be enough tissue for PDL-1 testing.  May need to discuss, consider repeat biopsy if not enough tissue available to send. -Need MRI abdomen at some point as part of the staging evaluation to visualize hepatic lesions -MRI brain was negative  Postobstructive versus community-acquired pneumonia -Agree with current antibiotic choice  Tobacco abuse, suspected COPD -Needs continued tobacco cessation -Follow-up in our office to obtain pulmonary function  testing, assess her status after the initiation of treatment for her lung cancer.  Appointment made with Eric Form on 11/18 at 14:00 -Consider adding albuterol as needed   Labs   CBC: Recent Labs  Lab 08/31/19 1730 09/01/19 0756 09/02/19 0527 09/03/19 0810   WBC 11.4* 9.6 8.3 9.3  NEUTROABS 8.8* 5.3  --   --   HGB 9.1* 9.0* 8.7* 10.1*  HCT 31.0* 30.1* 28.9* 33.7*  MCV 85.6 85.8 86.0 85.5  PLT 347 351 328 396    Basic Metabolic Panel: Recent Labs  Lab 08/31/19 1745 09/01/19 0756  NA 141 140  K 4.7 4.2  CL 110 108  CO2 21* 22  GLUCOSE 114* 89  BUN 19 16  CREATININE 1.25* 1.27*  CALCIUM 8.8* 8.7*   GFR: Estimated Creatinine Clearance: 43.5 mL/min (A) (by C-G formula based on SCr of 1.27 mg/dL (H)). Recent Labs  Lab 08/31/19 1730 08/31/19 2245 09/01/19 0756 09/02/19 0527 09/03/19 0810  PROCALCITON  --  <0.10  --   --   --   WBC 11.4*  --  9.6 8.3 9.3    Liver Function Tests: Recent Labs  Lab 08/31/19 1730 08/31/19 1745  AST 15 14*  ALT 10 10  ALKPHOS 62 60  BILITOT 0.6 0.4  PROT 7.4 7.5  ALBUMIN 3.4* 3.3*   No results for input(s): LIPASE, AMYLASE in the last 168 hours. No results for input(s): AMMONIA in the last 168 hours.  ABG No results found for: PHART, PCO2ART, PO2ART, HCO3, TCO2, ACIDBASEDEF, O2SAT   Coagulation Profile: Recent Labs  Lab 08/31/19 2245  INR 1.1     Baltazar Apo, MD, PhD 09/03/2019, 2:09 PM [facility] [phone] or if no answer [phone]

## 2019-09-03 NOTE — Progress Notes (Signed)
Brief Pharmacy Note:   70 y/oF on IV heparin for PE and bilateral DVT.    PM heparin level = 0.33 units/mL, now therapeutic but on lower end of goal range.  CBC: Hgb improved to 10.1, Pltc WNL  No bleeding or infusion issues noted per nursing  RN reports heparin infusion was not turned off during MRI today    Plan:  Increase heparin infusion to 1500 units/hr  Heparin level 6 hours after rate change  Daily CBC, heparin level  Monitor closely for s/sx of bleeding   Lindell Spar, PharmD, BCPS Clinical Pharmacist  09/03/2019 7:50 PM

## 2019-09-03 NOTE — Progress Notes (Signed)
ANTICOAGULATION CONSULT NOTE  Pharmacy Consult for Heparin Indication: pulmonary embolus  No Known Allergies  Patient Measurements: Height: 5\' 4"  (162.6 cm) Weight: 158 lb 6.4 oz (71.9 kg) IBW/kg (Calculated) : 54.7 Heparin Dosing Weight: 69.4  Vital Signs: Temp: 98.9 F (37.2 C) (10/23 0542) Temp Source: Oral (10/23 0542) BP: 133/79 (10/23 0542) Pulse Rate: 66 (10/23 0542)  Labs: Recent Labs    08/31/19 1745 08/31/19 2245  09/01/19 0756 09/01/19 1537 09/02/19 0527 09/02/19 2201 09/03/19 0810  HGB  --   --   --  9.0*  --  8.7*  --  10.1*  HCT  --   --   --  30.1*  --  28.9*  --  33.7*  PLT  --   --   --  351  --  328  --  362  APTT  --  35  --   --   --   --   --   --   LABPROT  --  13.8  --   --   --   --   --   --   INR  --  1.1  --   --   --   --   --   --   HEPARINUNFRC  --   --    < > 0.22* 0.44  --  <0.10* 0.16*  CREATININE 1.25*  --   --  1.27*  --   --   --   --   TROPONINIHS  --  4  --   --   --   --   --   --    < > = values in this interval not displayed.    Estimated Creatinine Clearance: 43.5 mL/min (A) (by C-G formula based on SCr of 1.27 mg/dL (H)).  Assessment: Pt is a 63YOF presenting w/ c/o SOB x 2 months. Pt found to have RUL mass w/ mediastinal mass or adenopathy, encasement of pulmonary arteries, pulmonary embolism, possible infection and suspected left atrial invasion. Pharmacy to dose heparin for PE.  Today, 09/03/2019 Hg 10.1 low but trending up;, Plts WNL and steady Heparin Level 0.16 - subtherapeutic No bleeding noted  Goal of Therapy:  Heparin level 0.3-0.7 units/ml Monitor platelets by anticoagulation protocol: Yes  Plan:  Heparin bolus 2000 units IV x 1 Increase heparin rate to 1450 units/hr 6 hour heparin level Daily CBC, no daily heparin level yet   Orbie Pyo, PharmD Candidate 09/03/2019 8:53 AM

## 2019-09-03 NOTE — Care Management Important Message (Signed)
Important Message  Patient Details IM Letter given to Dessa Phi RN to present to the Patient Name: Brianna Saunders MRN: [id number] Date of Birth: [birth year]   Medicare Important Message Given:  Yes     Kerin Salen 09/03/2019, 11:15 AM

## 2019-09-03 NOTE — Progress Notes (Signed)
ANTICOAGULATION CONSULT NOTE - Follow Up Consult  Pharmacy Consult for Heparin Indication: pulmonary embolus  No Known Allergies  Patient Measurements: Height: 5\' 4"  (162.6 cm) Weight: 158 lb 6.4 oz (71.9 kg) IBW/kg (Calculated) : 54.7 Heparin Dosing Weight:   Vital Signs: Temp: 98.6 F (37 C) (10/22 2147) Temp Source: Oral (10/22 2147) BP: 133/80 (10/22 2147) Pulse Rate: 91 (10/22 2147)  Labs: Recent Labs    08/31/19 1730 08/31/19 1745 08/31/19 2245 09/01/19 0756 09/01/19 1537 09/02/19 0527 09/02/19 2201  HGB 9.1*  --   --  9.0*  --  8.7*  --   HCT 31.0*  --   --  30.1*  --  28.9*  --   PLT 347  --   --  351  --  328  --   APTT  --   --  35  --   --   --   --   LABPROT  --   --  13.8  --   --   --   --   INR  --   --  1.1  --   --   --   --   HEPARINUNFRC  --   --   --  0.22* 0.44  --  <0.10*  CREATININE  --  1.25*  --  1.27*  --   --   --   TROPONINIHS  --   --  4  --   --   --   --     Estimated Creatinine Clearance: 43.5 mL/min (A) (by C-G formula based on SCr of 1.27 mg/dL (H)).   Medications:  Infusions:  . sodium chloride    . sodium chloride    . cefTRIAXone (ROCEPHIN)  IV 2 g (09/02/19 2207)  . heparin 1,250 Units/hr (09/02/19 1351)    Assessment: Patient with low heparin level.  RN noted patient went to MRI and heparin was turned off ~40-8mins just before level drawn.  No other heparin issues noted.  Goal of Therapy:  Heparin level 0.3-0.7 units/ml Monitor platelets by anticoagulation protocol: Yes   Plan:  Continue heparin drip at current rate Recheck level at 0800  Tyler Deis, [place] 09/03/2019,4:24 AM

## 2019-09-03 NOTE — Plan of Care (Signed)
  Problem: Health Behavior/Discharge Planning: Goal: Ability to manage health-related needs will improve Outcome: Progressing   Problem: Clinical Measurements: Goal: Ability to maintain clinical measurements within normal limits will improve Outcome: Progressing   Problem: Activity: Goal: Risk for activity intolerance will decrease Outcome: Progressing   

## 2019-09-03 NOTE — Progress Notes (Addendum)
PCCM interval note  Pathology and cytology still pending at this time.  We will keep watch for these results. Appreciate Dr. Lorette Ang consultation and input.  Staging work-up outlined and pending.  I will keep watch for cytology results.  If she is discharged prior to that report then need to arrange quick Oncology outpatient follow-up to review results and plan next steps.  Baltazar Apo, MD, PhD 09/03/2019, 10:45 AM [facility] [phone] or if no answer [phone]   Addendum: Notified by pathology that the patient's brushings are consistent with squamous cell carcinoma.  I will discuss with the patient. Question whether she may benefit from repeat bx at some point if there is not enough tissue to send molecular studies, PDL-1 eval.   Baltazar Apo, MD, PhD 09/03/2019, 11:07 AM [facility] [phone] or if no answer [phone]

## 2019-09-03 NOTE — Progress Notes (Addendum)
[facility]                                                                              Patient Demographics  Brianna Saunders, is a 63 y.o. female, DOB - 10/04/1956, YKD:[id number]  Admit date - 08/31/2019   Admitting Physician Vianne Bulls, MD  Outpatient Primary MD for the patient is [name], MD  Outpatient specialists:   LOS - 2  days   Medical records reviewed and are as summarized below:    Chief Complaint  Patient presents with   Shortness of Breath       Brief summary   Patient is a 63 year old female with history of neuropathy, depression, hypertension, tobacco use presented with slowly progressive shortness of breath and coughing.  Patient reported months of insidiously worsening dyspnea and nonproductive cough, worsened over the past couple of days to the point that she was so short of breath with minimal exertion.  Patient continues to smoke roughly 1/3 pack/day. In ED, patient was saturating low 90s on room air, tachypneic, stable BP.  CT angiogram of the chest showed large right upper lobe mass that appeared contiguous with mediastinal mass or adenopathy, encases right pulmonary arteries, left lower lobe pulmonary embolism.  Patient was started on IV heparin. COVID-19 test negative  Assessment & Plan    Principal Problem:   Lung mass, right upper lobe, squamous cell carcinoma, new diagnosis -Patient presented with slowly progressive dyspnea, coughing, found to have large right upper lung mass, with possible infiltrates, associated PE. -Patient was placed on IV heparin, IV Zithromax and Rocephin, placed on O2 as needed - Appreciate Pulmonology assistance, s/p bronch and biopsies by Dr Lamonte Sakai brushings consistent with squamous cell carcinoma -Appreciate oncology recommendations, MRI brain showed no brain mets, MRI abdomen pending today for staging work-up -Ordered Tessalon Perles and Hycodan for dry persistent cough.  Active  Problems:  Acute pulmonary embolism with bilateral DVTs -Continue heparin drip for now, will start Xarelto in a.m. if no procedures are needed.  Awaiting oncology recommendations. -Doppler ultrasound of the lower extremities showed bilateral DVTs -2D echo showed EF of 60-65%, normal LVEF, impaired relaxation pattern of LV diastolic filling.    Hypertension -BP stable, continue to hold lisinopril, continue Norvasc, metoprolol    Current smoker -Counseled on smoking cessation, continue nicotine patch    Normocytic anemia -H&H currently stable  Mild AKI on CKD stage III -Creatinine 1.25 on admission, baseline 1.0 -Creatinine close to baseline    Liver lesion, incidentally seen on CTA chest -Obtaining MRI of the abdomen, triple phase to rule out liver lesions given new lung mass  Depression Continue Celexa, Wellbutrin  Code Status: *full  DVT Prophylaxis: IV heparin drip Family Communication: Discussed all imaging results, lab results, explained to the patient and daughter on the phone.  Disposition Plan: Remains inpatient for work-up of right lung mass, acute PE  Time Spent in minutes 25 minutes  Procedures:  CT angiogram of the chest Bronchoscopy with biopsy on 10/22 MRI brain  Consultants:   Pulmonology Oncology  Antimicrobials:   Anti-infectives (From admission, onward)   Start  Dose/Rate Route Frequency Ordered Stop   09/02/19 2200  azithromycin (ZITHROMAX) tablet 500 mg     500 mg Oral Daily at bedtime 09/02/19 1057 09/05/19 2159   08/31/19 2200  cefTRIAXone (ROCEPHIN) 2 g in sodium chloride 0.9 % 100 mL IVPB     2 g 200 mL/hr over 30 Minutes Intravenous Every 24 hours 08/31/19 2152 09/05/19 2159   08/31/19 2200  azithromycin (ZITHROMAX) 500 mg in sodium chloride 0.9 % 250 mL IVPB  Status:  Discontinued     500 mg 250 mL/hr over 60 Minutes Intravenous Every 24 hours 08/31/19 2152 09/02/19 1057         Medications  Scheduled Meds:  amLODipine  10  mg Oral Daily   azithromycin  500 mg Oral QHS   benzonatate  100 mg Oral TID   buPROPion  150 mg Oral Daily   citalopram  40 mg Oral Daily   feeding supplement  1 Container Oral Q24H   feeding supplement (ENSURE ENLIVE)  237 mL Oral BID BM   feeding supplement (PRO-STAT SUGAR FREE 64)  30 mL Oral Daily   gabapentin  900 mg Oral TID   metoprolol tartrate  50 mg Oral BID   multivitamin with minerals  1 tablet Oral Daily   nicotine  7 mg Transdermal Daily   sodium chloride flush  3 mL Intravenous Q12H   sodium chloride flush  3 mL Intravenous Q12H   Continuous Infusions:  sodium chloride     cefTRIAXone (ROCEPHIN)  IV Stopped (09/02/19 2305)   heparin 1,450 Units/hr (09/03/19 1012)   PRN Meds:.sodium chloride, acetaminophen, baclofen, HYDROcodone-acetaminophen, HYDROcodone-homatropine, morphine injection, ondansetron (ZOFRAN) IV, polyethylene glycol, sodium chloride flush      Subjective:   Jlyn Cerros was seen and examined today.  Complaining of dry persistent cough, bothering her.  Shortness of breath is improving.  No fevers.  Patient denies dizziness, abdominal pain, N/V/D/C, new weakness, numbess, tingling. No acute events overnight.    Objective:   Vitals:   09/02/19 2147 09/03/19 0542 09/03/19 1249 09/03/19 1251  BP: 133/80 133/79  99/66  Pulse: 91 66  (!) 59  Resp: 19 19  15   Temp: 98.6 F (37 C) 98.9 F (37.2 C) 98.2 F (36.8 C)   TempSrc: Oral Oral Oral   SpO2: (!) 86% 92%  94%  Weight:      Height:        Intake/Output Summary (Last 24 hours) at 09/03/2019 1419 Last data filed at 09/03/2019 0600 Gross per 24 hour  Intake 868.49 ml  Output 800 ml  Net 68.49 ml     Wt Readings from Last 3 Encounters:  09/02/19 71.9 kg  07/09/19 73.9 kg  04/08/19 78 kg   Physical Exam  General: Alert and oriented x 3, NAD  Eyes:   HEENT:    Cardiovascular: S1 S2 clear,  RRR. No pedal edema b/l  Respiratory: CTAB, no wheezing, rales or  rhonchi  Gastrointestinal: Soft, nontender, nondistended, NBS  Ext: no pedal edema bilaterally  Neuro: no new deficits  Musculoskeletal: No cyanosis, clubbing  Skin: No rashes  Psych: Normal affect and demeanor, alert and oriented x3    Data Reviewed:  I have personally reviewed following labs and imaging studies  Micro Results Recent Results (from the past 240 hour(s))  SARS Coronavirus 2 by RT PCR (hospital order, performed in [place] hospital lab) Nasopharyngeal Nasopharyngeal Swab     Status: None   Collection Time: 08/31/19  8:22 PM  Specimen: Nasopharyngeal Swab  Result Value Ref Range Status   SARS Coronavirus 2 NEGATIVE NEGATIVE Final    Comment: (NOTE) If result is NEGATIVE SARS-CoV-2 target nucleic acids are NOT DETECTED. The SARS-CoV-2 RNA is generally detectable in upper and lower  respiratory specimens during the acute phase of infection. The lowest  concentration of SARS-CoV-2 viral copies this assay can detect is 250  copies / mL. A negative result does not preclude SARS-CoV-2 infection  and should not be used as the sole basis for treatment or other  patient management decisions.  A negative result may occur with  improper specimen collection / handling, submission of specimen other  than nasopharyngeal swab, presence of viral mutation(s) within the  areas targeted by this assay, and inadequate number of viral copies  (<250 copies / mL). A negative result must be combined with clinical  observations, patient history, and epidemiological information. If result is POSITIVE SARS-CoV-2 target nucleic acids are DETECTED. The SARS-CoV-2 RNA is generally detectable in upper and lower  respiratory specimens dur ing the acute phase of infection.  Positive  results are indicative of active infection with SARS-CoV-2.  Clinical  correlation with patient history and other diagnostic information is  necessary to determine patient infection status.  Positive results  do  not rule out bacterial infection or co-infection with other viruses. If result is PRESUMPTIVE POSTIVE SARS-CoV-2 nucleic acids MAY BE PRESENT.   A presumptive positive result was obtained on the submitted specimen  and confirmed on repeat testing.  While 2019 novel coronavirus  (SARS-CoV-2) nucleic acids may be present in the submitted sample  additional confirmatory testing may be necessary for epidemiological  and / or clinical management purposes  to differentiate between  SARS-CoV-2 and other Sarbecovirus currently known to infect humans.  If clinically indicated additional testing with an alternate test  methodology [id number]) is advised. The SARS-CoV-2 RNA is generally  detectable in upper and lower respiratory sp ecimens during the acute  phase of infection. The expected result is Negative. Fact Sheet for Patients:  StrictlyIdeas.no Fact Sheet for Healthcare Providers: BankingDealers.co.za This test is not yet approved or cleared by the [place] FDA and has been authorized for detection and/or diagnosis of SARS-CoV-2 by FDA under an Emergency Use Authorization (EUA).  This EUA will remain in effect (meaning this test can be used) for the duration of the COVID-19 declaration under Section 564(b)(1) of the Act, 21 U.S.C. section 360bbb-3(b)(1), unless the authorization is terminated or revoked sooner. Performed at [facility], [place] [address], [place], [place] [address]     Radiology Reports Dg Chest 2 View  Result Date: 08/31/2019 CLINICAL DATA:  Chest pain, shortness of breath. EXAM: CHEST - 2 VIEW COMPARISON:  None. FINDINGS: The heart size and mediastinal contours are within normal limits. No pneumothorax or pleural effusion is noted. Left lung is clear. Right midlung and lower lobe opacities are noted concerning for atelectasis or possibly infiltrates. There is noted right perihilar rounded opacity  concerning for possible neoplasm. The visualized skeletal structures are unremarkable. IMPRESSION: Right perihilar rounded opacity is noted concerning for possible neoplasm or malignancy. More peripheral opacities are noted concerning for inflammation or possible atelectasis. CT scan of the chest with intravenous contrast is recommended for further evaluation. Electronically Signed   By: Marijo Conception M.D.   On: 08/31/2019 16:55   Ct Chest W Contrast  Result Date: 08/31/2019 CLINICAL DATA:  Abnormal chest x-ray shortness of breath EXAM: CT CHEST WITH  CONTRAST TECHNIQUE: Multidetector CT imaging of the chest was performed during intravenous contrast administration. CONTRAST:  73mL OMNIPAQUE IOHEXOL 300 MG/ML  SOLN COMPARISON:  Chest x-ray 08/31/2019 FINDINGS: Cardiovascular: Nonaneurysmal aorta. Moderate aortic atherosclerosis. No dissection seen. Slightly enlarged pulmonary trunk up to 3.5 cm. Borderline cardiomegaly. Small pericardial effusion measuring up to 14 mm in thickness. 19 mm lobulated filling defect within the right side of the left atrium. Appears contiguous with soft tissue mass in the mediastinum. Coronary vascular calcification. Suspected small nonocclusive filling defect within subsegmental left lower lobe pulmonary artery, series 2, image number 103 and 104. Abduct truncation of the descending right pulmonary artery with essentially no significant enhancement of right middle or lower lobe pulmonary vessels. Narrowing of right upper lobe pulmonary arterial vessels by mediastinal soft tissue mass. Mediastinum/Nodes: Midline trachea. Subcentimeter hypodense thyroid nodules. Coarse calcifications in the thyroid. Esophagus within normal limits. Enlarged precarinal node measuring 16 mm. Subcarinal node measuring 14 mm. Ill-defined soft tissue density within the right hilus, encasing right upper lobe pulmonary vessels. Lungs/Pleura: Mild emphysema. Partially cavitary mass within the right upper lobe  measuring approximately 4.1 cm x 6.4 cm by 3.8 cm. This is contiguous with ill-defined soft tissue mass/adenopathy within the mediastinum 12 mm irregular nodule in the right upper lobe, series 5, image number 49. Ill-defined consolidations within the right upper lobe and the right middle lobe. Diffuse ground-glass density in the right lower lobe with small consolidations. No pleural effusion or pneumothorax. Upper Abdomen: Numerous hypodense liver lesions, some of which are consistent with cysts. Atrophic partially visualized right kidney. Musculoskeletal: Incompletely visualized age indeterminate fracture at the anterior inferior endplate T12 IMPRESSION: 1. Large poorly defined partially cavitary mass in the right upper lobe measuring approximately 4.1 x 6.4 x 3.8 cm, contiguous with ill-defined mass and/or adenopathy within the mediastinum. 1.9 cm pedunculated filling defect within the right aspect of the left atrium, appears contiguous with mediastinal soft tissue density and is concerning for left atrial invasion. There is narrowing and partial encasement of right upper lobe pulmonary arteries. 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 2. Small nonocclusive thrombus within subsegmental left lower lobe pulmonary artery. 3. 12 mm irregular right upper lobe pulmonary nodule which is either infectious, inflammatory, or neoplastic. Multifocal ill-defined consolidations in the right upper and middle lobes with diffuse ground-glass density in the right lower lobe, findings could be secondary to pneumonia, with consideration given to associated pulmonary infarcts given occluded appearance of right middle and lower lobe pulmonary arteries. 4. Numerous  hypodense liver lesions, some of which are consistent with cysts. Given findings in the chest, recommend correlation with nonemergent MRI. 5. Incompletely visualized age indeterminate fracture inferior endplate T12 6. Small pericardial effusion Critical Value/emergent results were called by telephone at the time of interpretation on 08/31/2019 at 7:54 pm to providerELLIOTT [facility] , who verbally acknowledged these results. Aortic Atherosclerosis (ICD10-I70.0) and Emphysema (ICD10-J43.9). Electronically Signed   By: Donavan Foil M.D.   On: 08/31/2019 19:54   Mr Jeri Cos RW Contrast  Result Date: 09/02/2019 CLINICAL DATA:  Small cell lung cancer staging EXAM: MRI HEAD WITHOUT AND WITH CONTRAST TECHNIQUE: Multiplanar, multiecho pulse sequences of the brain and surrounding structures were obtained without and with intravenous contrast. CONTRAST:  24mL GADAVIST GADOBUTROL 1 MMOL/ML IV  SOLN COMPARISON:  None. FINDINGS: Brain: No acute infarction, hemorrhage, hydrocephalus, extra-axial collection or mass lesion. Few small white matter hyperintensities bilaterally likely due to chronic ischemia. No enhancing metastatic deposits in the brain. Leptomeningeal enhancement normal. Vascular: Normal arterial flow voids. 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 Skull and upper cervical spine: Negative Sinuses/Orbits: Negative Other: 6 x 18 mm cyst right temporal subcutaneous tissues. No enhancement. Probable benign dermal cyst. IMPRESSION: Negative for metastatic disease to the brain.  No acute abnormality. Electronically Signed   By: Franchot Gallo M.D.   On: 09/02/2019 21:43   Dg Chest Port 1 View  Result Date: 09/02/2019 CLINICAL DATA:  Status post bronchoscopy EXAM: PORTABLE CHEST 1 VIEW COMPARISON:  08/31/2019 FINDINGS: Cardiac shadow is stable. Persistent mass lesion in the right upper lobe is noted adjacent to the right hilum with hilar adenopathy identified.  No post bronchoscopy pneumothorax is seen. No other focal abnormality is seen. IMPRESSION: Stable appearing right lung mass with hilar adenopathy. No post bronchoscopy pneumothorax is seen. Electronically Signed   By: [name] Catalina M.D.   On: 09/02/2019 08:40   Vas [place] Lower Extremity Venous (dvt)  Result Date: 09/02/2019  Lower Venous Study Indications: Pulmonary embolism.  Risk Factors: None identified. Limitations: Poor ultrasound/tissue interface and patient positioning, patient movement. Comparison Study: No prior studies. Performing Technologist: Oliver Hum RVT  Examination Guidelines: A complete evaluation includes B-mode imaging, spectral Doppler, color Doppler, and power Doppler as needed of all accessible portions of each vessel. Bilateral testing is considered an integral part of a complete examination. Limited examinations for reoccurring indications may be performed as noted.  +---------+---------------+---------+-----------+----------+--------------+  RIGHT     Compressibility Phasicity Spontaneity Properties Thrombus Aging  +---------+---------------+---------+-----------+----------+--------------+  CFV       Full            Yes       Yes                                    +---------+---------------+---------+-----------+----------+--------------+  SFJ       Full                                                             +---------+---------------+---------+-----------+----------+--------------+  FV Prox   Full                                                             +---------+---------------+---------+-----------+----------+--------------+  FV Mid    Full                                                             +---------+---------------+---------+-----------+----------+--------------+  FV Distal Full                                                             +---------+---------------+---------+-----------+----------+--------------+  PFV       Full                                                              +---------+---------------+---------+-----------+----------+--------------+  POP       Full            Yes       Yes                                    +---------+---------------+---------+-----------+----------+--------------+  PTV       Partial                                          Acute           +---------+---------------+---------+-----------+----------+--------------+  PERO      Full                                                             +---------+---------------+---------+-----------+----------+--------------+   +---------+---------------+---------+-----------+----------+--------------+  LEFT      Compressibility Phasicity Spontaneity Properties Thrombus Aging  +---------+---------------+---------+-----------+----------+--------------+  CFV       Full            Yes       Yes                                    +---------+---------------+---------+-----------+----------+--------------+  SFJ       Full                                                             +---------+---------------+---------+-----------+----------+--------------+  FV Prox   Full                                                             +---------+---------------+---------+-----------+----------+--------------+  FV Mid    Full                                                             +---------+---------------+---------+-----------+----------+--------------+  FV Distal Full                                                             +---------+---------------+---------+-----------+----------+--------------+  PFV       Full                                                             +---------+---------------+---------+-----------+----------+--------------+  POP       Partial         Yes       Yes                    Acute           +---------+---------------+---------+-----------+----------+--------------+  PTV       Full                                                              +---------+---------------+---------+-----------+----------+--------------+  PERO      Full                                                             +---------+---------------+---------+-----------+----------+--------------+  Gastroc   Full                                                             +---------+---------------+---------+-----------+----------+--------------+  SSV       Full                                                             +---------+---------------+---------+-----------+----------+--------------+     Summary: Right: Findings consistent with acute deep vein thrombosis involving the right posterior tibial veins. No cystic structure found in the popliteal fossa. Left: Findings consistent with acute deep vein thrombosis involving the left popliteal vein. No cystic structure found in the popliteal fossa.  *See table(s) above for measurements and observations. Electronically signed by Servando Snare MD on 09/02/2019 at 2:17:01 PM.    Final    Dg C-arm Bronchoscopy  Result Date: 09/02/2019 C-ARM BRONCHOSCOPY: Fluoroscopy was utilized by the requesting physician.  No radiographic interpretation.    Lab Data:  CBC: Recent Labs  Lab 08/31/19 1730 09/01/19 0756 09/02/19 0527 09/03/19 0810  WBC 11.4* 9.6 8.3 9.3  NEUTROABS 8.8* 5.3  --   --   HGB 9.1* 9.0* 8.7* 10.1*  HCT 31.0* 30.1* 28.9* 33.7*  MCV 85.6 85.8 86.0 85.5  PLT 347 351 328 469   Basic Metabolic Panel: Recent Labs  Lab 08/31/19 1745 09/01/19 0756  NA 141 140  K 4.7 4.2  CL 110 108  CO2 21* 22  GLUCOSE 114* 89  BUN 19 16  CREATININE 1.25* 1.27*  CALCIUM 8.8* 8.7*  GFR: Estimated Creatinine Clearance: 43.5 mL/min (A) (by C-G formula based on SCr of 1.27 mg/dL (H)). Liver Function Tests: Recent Labs  Lab 08/31/19 1730 08/31/19 1745  AST 15 14*  ALT 10 10  ALKPHOS 62 60  BILITOT 0.6 0.4  PROT 7.4 7.5  ALBUMIN 3.4* 3.3*   No results for input(s): LIPASE, AMYLASE in the last 168 hours. No  results for input(s): AMMONIA in the last 168 hours. Coagulation Profile: Recent Labs  Lab 08/31/19 2245  INR 1.1   Cardiac Enzymes: No results for input(s): CKTOTAL, CKMB, CKMBINDEX, TROPONINI in the last 168 hours. BNP (last 3 results) No results for input(s): PROBNP in the last 8760 hours. HbA1C: No results for input(s): HGBA1C in the last 72 hours. CBG: No results for input(s): GLUCAP in the last 168 hours. Lipid Profile: No results for input(s): CHOL, HDL, LDLCALC, TRIG, CHOLHDL, LDLDIRECT in the last 72 hours. Thyroid Function Tests: No results for input(s): TSH, T4TOTAL, FREET4, T3FREE, THYROIDAB in the last 72 hours. Anemia Panel: No results for input(s): VITAMINB12, FOLATE, FERRITIN, TIBC, IRON, RETICCTPCT in the last 72 hours. Urine analysis: No results found for: COLORURINE, APPEARANCEUR, LABSPEC, PHURINE, GLUCOSEU, HGBUR, BILIRUBINUR, KETONESUR, PROTEINUR, UROBILINOGEN, NITRITE, LEUKOCYTESUR   Edin Skarda M.D. [facility] 09/03/2019, 2:19 PM  Pager: [phone] Between 7am to 7pm - call Pager - [phone]  After 7pm go to [email or web address] - password [email or web address]  Call night coverage person covering after 7pm

## 2019-09-04 DIAGNOSIS — C3491 Malignant neoplasm of unspecified part of right bronchus or lung: Secondary | ICD-10-CM

## 2019-09-04 LAB — BASIC METABOLIC PANEL
Anion gap: 9 (ref 5–15)
BUN: 25 mg/dL — ABNORMAL HIGH (ref 8–23)
CO2: 23 mmol/L (ref 22–32)
Calcium: 9.1 mg/dL (ref 8.9–10.3)
Chloride: 107 mmol/L (ref 98–111)
Creatinine, Ser: 1.23 mg/dL — ABNORMAL HIGH (ref 0.44–1.00)
GFR calc Af Amer: 54 mL/min — ABNORMAL LOW (ref >60)
GFR calc non Af Amer: 46 mL/min — ABNORMAL LOW (ref >60)
Glucose, Bld: 105 mg/dL — ABNORMAL HIGH (ref 70–99)
Potassium: 4.3 mmol/L (ref 3.5–5.1)
Sodium: 139 mmol/L (ref 135–145)

## 2019-09-04 LAB — CBC
HCT: 31.3 % — ABNORMAL LOW (ref 36.0–46.0)
Hemoglobin: 9.3 g/dL — ABNORMAL LOW (ref 12.0–15.0)
MCH: 25.2 pg — ABNORMAL LOW (ref 26.0–34.0)
MCHC: 29.7 g/dL — ABNORMAL LOW (ref 30.0–36.0)
MCV: 84.8 fL (ref 80.0–100.0)
Platelets: 352 10*3/uL (ref 150–400)
RBC: 3.69 MIL/uL — ABNORMAL LOW (ref 3.87–5.11)
RDW: 18.4 % — ABNORMAL HIGH (ref 11.5–15.5)
WBC: 8.9 10*3/uL (ref 4.0–10.5)
nRBC: 0 % (ref 0.0–0.2)

## 2019-09-04 LAB — HEPARIN LEVEL (UNFRACTIONATED): Heparin Unfractionated: 0.48 IU/mL (ref 0.30–0.70)

## 2019-09-04 MED ORDER — BUPROPION HCL ER (XL) 150 MG PO TB24: 150.0000 mg | ORAL_TABLET | Freq: Every day | ORAL | Status: DC

## 2019-09-04 MED ORDER — CEFPODOXIME PROXETIL 100 MG PO TABS: 100.0000 mg | ORAL_TABLET | Freq: Two times a day (BID) | ORAL | 0 refills | Status: AC

## 2019-09-04 MED ORDER — RIVAROXABAN 20 MG PO TABS: 20.0000 mg | ORAL_TABLET | Freq: Every day | ORAL | Status: DC

## 2019-09-04 MED ORDER — GABAPENTIN 300 MG PO CAPS: 600.0000 mg | ORAL_CAPSULE | Freq: Three times a day (TID) | ORAL | Status: DC

## 2019-09-04 MED ORDER — NICOTINE 14 MG/24HR TD PT24: 14.0000 mg | MEDICATED_PATCH | Freq: Every day | TRANSDERMAL | 1 refills | Status: DC

## 2019-09-04 MED ORDER — RIVAROXABAN (XARELTO) VTE STARTER PACK (15 & 20 MG): ORAL_TABLET | ORAL | 0 refills | Status: DC

## 2019-09-04 MED ORDER — HYDROCODONE-HOMATROPINE 5-1.5 MG/5ML PO SYRP: 5.0000 mL | ORAL_SOLUTION | Freq: Three times a day (TID) | ORAL | 0 refills | Status: DC | PRN

## 2019-09-04 MED ORDER — AZITHROMYCIN 500 MG PO TABS: 500.0000 mg | ORAL_TABLET | Freq: Every day | ORAL | 0 refills | Status: AC

## 2019-09-04 MED ORDER — GABAPENTIN 300 MG PO CAPS: 600.0000 mg | ORAL_CAPSULE | Freq: Three times a day (TID) | ORAL | 1 refills | Status: DC

## 2019-09-04 MED ORDER — TRAMADOL HCL 50 MG PO TABS: 50.0000 mg | ORAL_TABLET | Freq: Three times a day (TID) | ORAL | 0 refills | Status: DC | PRN

## 2019-09-04 MED ORDER — RIVAROXABAN 20 MG PO TABS: 20.0000 mg | ORAL_TABLET | Freq: Every day | ORAL | 4 refills | Status: DC

## 2019-09-04 MED ORDER — BENZONATATE 100 MG PO CAPS: 100.0000 mg | ORAL_CAPSULE | Freq: Three times a day (TID) | ORAL | 0 refills | Status: DC | PRN

## 2019-09-04 MED ORDER — RIVAROXABAN 15 MG PO TABS
15.0000 mg | ORAL_TABLET | Freq: Two times a day (BID) | ORAL | Status: DC
  Administered 2019-09-04: 15 mg via ORAL
  Filled 2019-09-04: qty 1

## 2019-09-04 NOTE — Progress Notes (Signed)
ANTICOAGULATION CONSULT NOTE  Pharmacy Consult for Heparin>>Xarelto Indication: pulmonary embolus  No Known Allergies  Patient Measurements: Height: 5\' 4"  (162.6 cm) Weight: 158 lb 6.4 oz (71.9 kg) IBW/kg (Calculated) : 54.7 Heparin Dosing Weight: 69.4  Vital Signs: Temp: 97.5 F (36.4 C) (10/24 0942) Temp Source: Oral (10/24 0942) BP: 107/67 (10/24 0942) Pulse Rate: 70 (10/24 0942)  Labs: Recent Labs    09/02/19 0527  09/03/19 0810 09/03/19 1823 09/04/19 0240  HGB 8.7*  --  10.1*  --  9.3*  HCT 28.9*  --  33.7*  --  31.3*  PLT 328  --  362  --  352  HEPARINUNFRC  --    < > 0.16* 0.33 0.48  CREATININE  --   --   --   --  1.23*   < > = values in this interval not displayed.    Estimated Creatinine Clearance: 44.9 mL/min (A) (by C-G formula based on SCr of 1.23 mg/dL (H)).  Assessment: Pt is a 63YOF presenting w/ c/o SOB x 2 months. Pt found to have RUL mass w/ mediastinal mass or adenopathy, encasement of pulmonary arteries, pulmonary embolism, possible infection and suspected left atrial invasion. Pharmacy to dose heparin for PE.  Today, 09/04/2019  Heparin level therapeutic at 0.48 on 1500 units/hr CBC stable, no bleeding reported, SCr 1.23 CrCl > 30 ml/min To transition to Xarelto today   Goal of Therapy:  Heparin level 0.3-0.7 units/ml Monitor platelets by anticoagulation protocol: Yes  Plan:  DC heparin drip Xarelto 15 mg po BID with food x 21 days then Xarelto 15 mg qsupper DC heparin labs Will provide education prior to DC Xarelto no longer provides 30 day free card  Will provide pt info sheet: to register for [employer]. Text "VOUCHER" to [phone] or call [phone] for help M-F 8-8.  Eudelia Bunch, Pharm.D [id number] 09/04/2019 9:58 AM

## 2019-09-04 NOTE — Progress Notes (Signed)
ANTICOAGULATION CONSULT NOTE - Follow Up Consult  Pharmacy Consult for Heparin Indication: pulmonary embolus and DVT  No Known Allergies  Patient Measurements: Height: 5\' 4"  (162.6 cm) Weight: 158 lb 6.4 oz (71.9 kg) IBW/kg (Calculated) : 54.7 Heparin Dosing Weight:   Vital Signs: Temp: 98.5 F (36.9 C) (10/23 2118) BP: 116/86 (10/23 2118) Pulse Rate: 69 (10/23 2118)  Labs: Recent Labs    09/01/19 0756  09/02/19 0527  09/03/19 0810 09/03/19 1823 09/04/19 0240  HGB 9.0*  --  8.7*  --  10.1*  --  9.3*  HCT 30.1*  --  28.9*  --  33.7*  --  31.3*  PLT 351  --  328  --  362  --  352  HEPARINUNFRC 0.22*   < >  --    < > 0.16* 0.33 0.48  CREATININE 1.27*  --   --   --   --   --  1.23*   < > = values in this interval not displayed.    Estimated Creatinine Clearance: 44.9 mL/min (A) (by C-G formula based on SCr of 1.23 mg/dL (H)).   Medications:  Infusions:  . cefTRIAXone (ROCEPHIN)  IV Stopped (09/03/19 2200)  . heparin 1,500 Units/hr (09/03/19 1950)    Assessment: Patient with heparin level at goal.  No heparin issues noted.  Goal of Therapy:  Heparin level 0.3-0.7 units/ml Monitor platelets by anticoagulation protocol: Yes   Plan:  Continue heparin drip at current rate Recheck level at [address] [place] 09/04/2019,4:14 AM

## 2019-09-04 NOTE — Discharge Summary (Signed)
Physician Discharge Summary   Patient ID: Brianna Saunders MRN: [id number] DOB/AGE: 1955/11/29 63 y.o.  Admit date: 08/31/2019 Discharge date: 09/04/2019  Primary Care Physician:  Marin Olp, MD   Recommendations for Outpatient Follow-up:  1. Follow up with PCP in 1-2 weeks 2. Patient was started on Xarelto 15 mg p.o. twice daily for 21 days, then decrease to 15 mg daily 3. Needs PET/CT and further management by pulmonology and oncology. 4. Pulmonology appointment scheduled, sent message to Dr. Maylon Peppers for arranging oncology follow-up.  Home Health: None Equipment/Devices:   Discharge Condition: stable  CODE STATUS: FULL  Diet recommendation: Heart healthy diet   Discharge Diagnoses:    . Right upper lobe lung mass . Acute pulmonary embolism . Hypertension . Acute bilateral DVTs . Current smoker . Normocytic anemia . Depression Mild AKI on CKD stage III Enhancing L3 vertebral and median right iliac bone lesion   Consults: Pulmonology, Dr. Lamonte Sakai Oncology, Dr. Maylon Peppers   Allergies:  No Known Allergies   DISCHARGE MEDICATIONS: Allergies as of 09/04/2019   No Known Allergies     Medication List    STOP taking these medications   lisinopril 40 MG tablet Commonly known as: ZESTRIL     TAKE these medications   amLODipine 10 MG tablet Commonly known as: NORVASC Take 1 tablet (10 mg total) by mouth daily.   azithromycin 500 MG tablet Commonly known as: ZITHROMAX Take 1 tablet (500 mg total) by mouth daily for 5 days.   baclofen 10 MG tablet Commonly known as: LIORESAL TAKE 1 TABLET BY MOUTH THREE TIMES A DAY AS NEEDED FOR MUSCLE SPASMS What changed:   how much to take  how to take this  when to take this  additional instructions   benzonatate 100 MG capsule Commonly known as: TESSALON Take 1 capsule (100 mg total) by mouth 3 (three) times daily as needed for cough.   buPROPion 150 MG 24 hr tablet Commonly known as: WELLBUTRIN XL Take  1 tablet (150 mg total) by mouth daily.   cefpodoxime 100 MG tablet Commonly known as: VANTIN Take 1 tablet (100 mg total) by mouth 2 (two) times daily for 5 days.   citalopram 40 MG tablet Commonly known as: CELEXA Take 1 tablet (40 mg total) by mouth daily.   gabapentin 300 MG capsule Commonly known as: NEURONTIN Take 2 capsules (600 mg total) by mouth 3 (three) times daily. What changed: how much to take   HYDROcodone-homatropine 5-1.5 MG/5ML syrup Commonly known as: HYCODAN Take 5 mLs by mouth every 8 (eight) hours as needed for cough.   metoprolol tartrate 50 MG tablet Commonly known as: LOPRESSOR Take 1 tablet (50 mg total) by mouth 2 (two) times daily.   nicotine 14 mg/24hr patch Commonly known as: NICODERM CQ - dosed in mg/24 hours Place 1 patch (14 mg total) onto the skin daily. Start taking on: September 05, 2019   Rivaroxaban 15 & 20 MG Tbpk Follow package directions: Take one 15mg  tablet by mouth twice a day. On day 22 (09/25/19), switch to one 20mg  tablet once a day. Take with food.   rivaroxaban 20 MG Tabs tablet Commonly known as: XARELTO Take 1 tablet (20 mg total) by mouth daily with supper. Please start this after you have completed the starter pack. Start taking on: September 25, 2019   traMADol 50 MG tablet Commonly known as: Ultram Take 1 tablet (50 mg total) by mouth every 8 (eight) hours as needed for moderate pain  or severe pain.   Vitamin D (Ergocalciferol) 1.25 MG (50000 UT) Caps capsule Commonly known as: DRISDOL TAKE 1 CAPSULE (50,000 UNITS TOTAL) BY MOUTH EVERY 7 (SEVEN) DAYS.        Brief H and P: For complete details please refer to admission H and P, but in brief Patient is a 63 year old female with history of neuropathy, depression, hypertension, tobacco use presented with slowly progressive shortness of breath and coughing.  Patient reported months of insidiously worsening dyspnea and nonproductive cough, worsened over the past couple of  days to the point that she was so short of breath with minimal exertion.  Patient continues to smoke roughly 1/3 pack/day. In ED, patient was saturating low 90s on room air, tachypneic, stable BP.  CT angiogram of the chest showed large right upper lobe mass that appeared contiguous with mediastinal mass or adenopathy, encases right pulmonary arteries, left lower lobe pulmonary embolism.  Patient was started on IV heparin. COVID-19 test negative   Hospital Course:   Lung mass, right upper lobe, squamous cell carcinoma, new diagnosis -Patient presented with slowly progressive dyspnea, coughing, found to have large right upper lung mass, with possible infiltrates, associated PE. -Patient was placed on empiric IV antibiotics, IV heparin drip -Pulmonology was consulted, s/p bronch and biopsies by Dr Lamonte Sakai, per note, cytology from 10/22 bronchoscopy consistent with squamous cell carcinoma. -Oncology was consulted, seen by Dr. Maylon Peppers -Per recommendations, MRI brain showed no brain mets, MRI abdomen showed enhancing L3 vertebral and medial right iliac T2 hypointense bone lesions concerning for possible bone mets, PET/CT recommended.  Numerous T2 hyperintense lesions throughout the liver without enhancement possibly benign liver cysts, will be better staged on PET/CT -Continue Tessalon Perles and Hycodan for dry persistent cough.    Acute pulmonary embolism with bilateral DVTs -Patient was placed on IV heparin drip -Doppler ultrasound of the lower extremities showed bilateral DVTs -2D echo showed EF of 60-65%, normal LVEF, impaired relaxation pattern of LV diastolic filling. -Transition to oral Xarelto after discussion with the patient    Hypertension -BP soft, continue Norvasc, metoprolol, hold lisinopril.      Current smoker -Counseled on smoking cessation, continue nicotine patch    Normocytic anemia -H&H stable  Mild AKI on CKD stage III -Creatinine 1.25 on admission, baseline  1.0 -Creatinine close to baseline    Liver lesion, incidentally seen on CTA chest -MRI of the abdomen showed numerous lesion through the liver without enhancement possibly benign however recommended PET/CT for better staging   Depression Continue Celexa, Wellbutrin  The management and plan was discussed with patient's daughter in detail and agreed.  Day of Discharge S: No acute complaints, cough better.  No acute chest pain.  BP 102/60 (BP Location: Left Arm)   Pulse 71   Temp 98.1 F (36.7 C) (Oral)   Resp 19   Ht 5\' 4"  (1.626 m)   Wt 71.9 kg   SpO2 95%   BMI 27.19 kg/m   Physical Exam: General: Alert and awake oriented x3 not in any acute distress. HEENT: anicteric sclera, pupils reactive to light and accommodation CVS: S1-S2 clear no murmur rubs or gallops Chest: Decreased breath sounds at the bases  Abdomen: soft nontender, nondistended, normal bowel sounds Extremities: no cyanosis, clubbing or edema noted bilaterally Neuro: Cranial nerves II-XII intact, no focal neurological deficits   The results of significant diagnostics from this hospitalization (including imaging, microbiology, ancillary and laboratory) are listed below for reference.      Procedures/Studies:  Dg Chest 2 View  Result Date: 08/31/2019 CLINICAL DATA:  Chest pain, shortness of breath. EXAM: CHEST - 2 VIEW COMPARISON:  None. FINDINGS: The heart size and mediastinal contours are within normal limits. No pneumothorax or pleural effusion is noted. Left lung is clear. Right midlung and lower lobe opacities are noted concerning for atelectasis or possibly infiltrates. There is noted right perihilar rounded opacity concerning for possible neoplasm. The visualized skeletal structures are unremarkable. IMPRESSION: Right perihilar rounded opacity is noted concerning for possible neoplasm or malignancy. More peripheral opacities are noted concerning for inflammation or possible atelectasis. CT scan of the  chest with intravenous contrast is recommended for further evaluation. Electronically Signed   By: Marijo Conception M.D.   On: 08/31/2019 16:55   Ct Chest W Contrast  Result Date: 08/31/2019 CLINICAL DATA:  Abnormal chest x-ray shortness of breath EXAM: CT CHEST WITH CONTRAST TECHNIQUE: Multidetector CT imaging of the chest was performed during intravenous contrast administration. CONTRAST:  65mL OMNIPAQUE IOHEXOL 300 MG/ML  SOLN COMPARISON:  Chest x-ray 08/31/2019 FINDINGS: Cardiovascular: Nonaneurysmal aorta. Moderate aortic atherosclerosis. No dissection seen. Slightly enlarged pulmonary trunk up to 3.5 cm. Borderline cardiomegaly. Small pericardial effusion measuring up to 14 mm in thickness. 19 mm lobulated filling defect within the right side of the left atrium. Appears contiguous with soft tissue mass in the mediastinum. Coronary vascular calcification. Suspected small nonocclusive filling defect within subsegmental left lower lobe pulmonary artery, series 2, image number 103 and 104. Abduct truncation of the descending right pulmonary artery with essentially no significant enhancement of right middle or lower lobe pulmonary vessels. Narrowing of right upper lobe pulmonary arterial vessels by mediastinal soft tissue mass. Mediastinum/Nodes: Midline trachea. Subcentimeter hypodense thyroid nodules. Coarse calcifications in the thyroid. Esophagus within normal limits. Enlarged precarinal node measuring 16 mm. Subcarinal node measuring 14 mm. Ill-defined soft tissue density within the right hilus, encasing right upper lobe pulmonary vessels. Lungs/Pleura: Mild emphysema. Partially cavitary mass within the right upper lobe measuring approximately 4.1 cm x 6.4 cm by 3.8 cm. This is contiguous with ill-defined soft tissue mass/adenopathy within the mediastinum 12 mm irregular nodule in the right upper lobe, series 5, image number 49. Ill-defined consolidations within the right upper lobe and the right middle  lobe. Diffuse ground-glass density in the right lower lobe with small consolidations. No pleural effusion or pneumothorax. Upper Abdomen: Numerous hypodense liver lesions, some of which are consistent with cysts. Atrophic partially visualized right kidney. Musculoskeletal: Incompletely visualized age indeterminate fracture at the anterior inferior endplate T12 IMPRESSION: 1. Large poorly defined partially cavitary mass in the right upper lobe measuring approximately 4.1 x 6.4 x 3.8 cm, contiguous with ill-defined mass and/or adenopathy within the mediastinum. 1.9 cm pedunculated filling defect within the right aspect of the left atrium, appears contiguous with mediastinal soft tissue density and is concerning for left atrial invasion. There is narrowing and partial encasement of right upper lobe pulmonary arteries. 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 2. Small nonocclusive thrombus within subsegmental left lower lobe pulmonary artery. 3. 12 mm irregular right upper lobe pulmonary nodule which is either infectious, inflammatory, or neoplastic. Multifocal ill-defined consolidations in the right upper and middle lobes with diffuse ground-glass density in the right lower lobe, findings could be secondary to  pneumonia, with consideration given to associated pulmonary infarcts given occluded appearance of right middle and lower lobe pulmonary arteries. 4. Numerous hypodense liver lesions, some of which are consistent with cysts. Given findings in the chest, recommend correlation with nonemergent MRI. 5. Incompletely visualized age indeterminate fracture inferior endplate T12 6. Small pericardial effusion Critical Value/emergent results were called by  telephone at the time of interpretation on 08/31/2019 at 7:54 pm to providerELLIOTT [facility] , who verbally acknowledged these results. Aortic Atherosclerosis (ICD10-I70.0) and Emphysema (ICD10-J43.9). Electronically Signed   By: Donavan Foil M.D.   On: 08/31/2019 19:54   Mr Jeri Cos XB Contrast  Result Date: 09/02/2019 CLINICAL DATA:  Small cell lung cancer staging EXAM: MRI HEAD WITHOUT AND WITH CONTRAST TECHNIQUE: Multiplanar, multiecho pulse sequences of the brain and surrounding structures were obtained without and with intravenous contrast. CONTRAST:  62mL GADAVIST GADOBUTROL 1 MMOL/ML IV SOLN COMPARISON:  None. FINDINGS: Brain: No acute infarction, hemorrhage, hydrocephalus, extra-axial collection or mass lesion. Few small white matter hyperintensities bilaterally likely due to chronic ischemia. No enhancing metastatic deposits in the brain. Leptomeningeal enhancement normal. Vascular: Normal arterial flow voids. 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 Skull and upper cervical spine: Negative Sinuses/Orbits: Negative Other: 6 x 18 mm cyst right temporal subcutaneous tissues. No enhancement. Probable benign dermal cyst. IMPRESSION: Negative for metastatic disease to the brain.  No acute abnormality. Electronically Signed   By: Franchot Gallo M.D.   On: 09/02/2019 21:43   Mr Abdomen W JY Contrast  Result Date: 09/03/2019 CLINICAL DATA:  Inpatient. New bronchoscopic diagnosis of right upper lobe squamous cell lung cancer. Indeterminate liver lesions on recent chest CT. EXAM: MRI ABDOMEN WITHOUT AND WITH CONTRAST TECHNIQUE: Multiplanar multisequence MR imaging of the abdomen was performed both before and after the administration of intravenous contrast. CONTRAST:  64mL GADAVIST GADOBUTROL 1 MMOL/ML IV SOLN COMPARISON:  08/31/2019 chest CT. FINDINGS: Images are severely motion degraded, significantly limiting assessment. Lower chest: Hazy patchy  right lung base opacity, correlating with patchy ground-glass opacity and consolidation at the right lung base on recent chest CT. Hepatobiliary: Normal liver size and configuration. No hepatic steatosis. Numerous homogeneous circumscribed T2 hyperintense lesions scattered throughout the liver, without definite enhancement on the severely motion degraded postcontrast sequences. Gallbladder appears contracted and is poorly evaluated due to motion degradation. No biliary ductal dilatation. Common bile duct diameter 3 mm. No evidence of choledocholithiasis. Pancreas: No pancreatic mass or duct dilation.  No pancreas divisum. Spleen: Normal size. No mass. Adrenals/Urinary Tract: No discrete adrenal lesions. Asymmetric moderate right renal atrophy. No hydronephrosis. No suspicious renal masses. Subcentimeter simple renal cyst in the anterior interpolar left kidney. Stomach/Bowel: Normal non-distended stomach. Visualized small and large bowel is normal caliber, with no bowel wall thickening. Vascular/Lymphatic: Atherosclerotic nonaneurysmal abdominal aorta. Patent portal, splenic, hepatic and renal veins. No pathologically enlarged lymph nodes in the abdomen. Other: No abdominal ascites or focal fluid collection. Musculoskeletal: There is a small enhancing 1.1 cm T2 hypointense inferior L3 vertebral lesion (series 6/image 18). There is a similar enhancing 1.7 cm T2 hypointense medial right iliac bone lesion (series 6/image 11). IMPRESSION: 1. Severely motion degraded MRI, substantially limiting evaluation. 2. Enhancing L3 vertebral and medial right iliac T2 hypointense bone lesions, concerning for bone metastases. PET-CT strongly recommended on a short term outpatient basis for further characterization. 3. Numerous T2 hyperintense lesions scattered throughout the liver, without definite enhancement on the severely motion degraded postcontrast sequences, presumably benign liver  cysts. The liver can be better staged on  PET-CT. Electronically Signed   By: Ilona Sorrel M.D.   On: 09/03/2019 20:06   Dg Chest Port 1 View  Result Date: 09/02/2019 CLINICAL DATA:  Status post bronchoscopy EXAM: PORTABLE CHEST 1 VIEW COMPARISON:  08/31/2019 FINDINGS: Cardiac shadow is stable. Persistent mass lesion in the right upper lobe is noted adjacent to the right hilum with hilar adenopathy identified. No post bronchoscopy pneumothorax is seen. No other focal abnormality is seen. IMPRESSION: Stable appearing right lung mass with hilar adenopathy. No post bronchoscopy pneumothorax is seen. Electronically Signed   By: [name] Catalina M.D.   On: 09/02/2019 08:40   Vas [place] Lower Extremity Venous (dvt)  Result Date: 09/02/2019  Lower Venous Study Indications: Pulmonary embolism.  Risk Factors: None identified. Limitations: Poor ultrasound/tissue interface and patient positioning, patient movement. Comparison Study: No prior studies. Performing Technologist: Oliver Hum RVT  Examination Guidelines: A complete evaluation includes B-mode imaging, spectral Doppler, color Doppler, and power Doppler as needed of all accessible portions of each vessel. Bilateral testing is considered an integral part of a complete examination. Limited examinations for reoccurring indications may be performed as noted.  +---------+---------------+---------+-----------+----------+--------------+ RIGHT    CompressibilityPhasicitySpontaneityPropertiesThrombus Aging +---------+---------------+---------+-----------+----------+--------------+ CFV      Full           Yes      Yes                                 +---------+---------------+---------+-----------+----------+--------------+ SFJ      Full                                                        +---------+---------------+---------+-----------+----------+--------------+ FV Prox  Full                                                         +---------+---------------+---------+-----------+----------+--------------+ FV Mid   Full                                                        +---------+---------------+---------+-----------+----------+--------------+ FV DistalFull                                                        +---------+---------------+---------+-----------+----------+--------------+ PFV      Full                                                        +---------+---------------+---------+-----------+----------+--------------+ POP      Full           Yes      Yes                                 +---------+---------------+---------+-----------+----------+--------------+  PTV      Partial                                      Acute          +---------+---------------+---------+-----------+----------+--------------+ PERO     Full                                                        +---------+---------------+---------+-----------+----------+--------------+   +---------+---------------+---------+-----------+----------+--------------+ LEFT     CompressibilityPhasicitySpontaneityPropertiesThrombus Aging +---------+---------------+---------+-----------+----------+--------------+ CFV      Full           Yes      Yes                                 +---------+---------------+---------+-----------+----------+--------------+ SFJ      Full                                                        +---------+---------------+---------+-----------+----------+--------------+ FV Prox  Full                                                        +---------+---------------+---------+-----------+----------+--------------+ FV Mid   Full                                                        +---------+---------------+---------+-----------+----------+--------------+ FV DistalFull                                                         +---------+---------------+---------+-----------+----------+--------------+ PFV      Full                                                        +---------+---------------+---------+-----------+----------+--------------+ POP      Partial        Yes      Yes                  Acute          +---------+---------------+---------+-----------+----------+--------------+ PTV      Full                                                        +---------+---------------+---------+-----------+----------+--------------+  PERO     Full                                                        +---------+---------------+---------+-----------+----------+--------------+ Gastroc  Full                                                        +---------+---------------+---------+-----------+----------+--------------+ SSV      Full                                                        +---------+---------------+---------+-----------+----------+--------------+     Summary: Right: Findings consistent with acute deep vein thrombosis involving the right posterior tibial veins. No cystic structure found in the popliteal fossa. Left: Findings consistent with acute deep vein thrombosis involving the left popliteal vein. No cystic structure found in the popliteal fossa.  *See table(s) above for measurements and observations. Electronically signed by Servando Snare MD on 09/02/2019 at 2:17:01 PM.    Final    Dg C-arm Bronchoscopy  Result Date: 09/02/2019 C-ARM BRONCHOSCOPY: Fluoroscopy was utilized by the requesting physician.  No radiographic interpretation.       LAB RESULTS: Basic Metabolic Panel: Recent Labs  Lab 09/01/19 0756 09/04/19 0240  NA 140 139  K 4.2 4.3  CL 108 107  CO2 22 23  GLUCOSE 89 105*  BUN 16 25*  CREATININE 1.27* 1.23*  CALCIUM 8.7* 9.1   Liver Function Tests: Recent Labs  Lab 08/31/19 1730 08/31/19 1745  AST 15 14*  ALT 10 10  ALKPHOS 62 60  BILITOT 0.6  0.4  PROT 7.4 7.5  ALBUMIN 3.4* 3.3*   No results for input(s): LIPASE, AMYLASE in the last 168 hours. No results for input(s): AMMONIA in the last 168 hours. CBC: Recent Labs  Lab 09/01/19 0756  09/03/19 0810 09/04/19 0240  WBC 9.6   < > 9.3 8.9  NEUTROABS 5.3  --   --   --   HGB 9.0*   < > 10.1* 9.3*  HCT 30.1*   < > 33.7* 31.3*  MCV 85.8   < > 85.5 84.8  PLT 351   < > 362 352   < > = values in this interval not displayed.   Cardiac Enzymes: No results for input(s): CKTOTAL, CKMB, CKMBINDEX, TROPONINI in the last 168 hours. BNP: Invalid input(s): POCBNP CBG: No results for input(s): GLUCAP in the last 168 hours.    Disposition and Follow-up: Discharge Instructions    Diet - low sodium heart healthy   Complete by: As directed    Discharge instructions   Complete by: As directed    Please take xarelto 15mg  twice a day (starter pack) with food. On Day 22 (09/25/19), please start 20mg  daily with supper.   Increase activity slowly   Complete by: As directed        DISPOSITION: Home   DISCHARGE FOLLOW-UP Follow-up Information    [name], NP Follow up on 09/29/2019.   Specialty:  Pulmonary Disease Why: 2:00 pm Contact information: [address] [place] [address] [phone]        [name], MD. Call.   Specialties: Hematology, Oncology Why: please call on Monday to arrange or confirm appointment. Contact information: [place] [address] [phone]            Time coordinating discharge:  35 minutes  Signed:   Estill Cotta M.D. [facility] 09/04/2019, 12:17 PM

## 2019-09-04 NOTE — Discharge Instructions (Signed)
Information on my medicine - XARELTO (rivaroxaban)  This medication education was reviewed with me or my healthcare representative as part of my discharge preparation.  WHY WAS XARELTO PRESCRIBED FOR YOU? Xarelto was prescribed to treat blood clots that may have been found in the veins of your legs (deep vein thrombosis) or in your lungs (pulmonary embolism) and to reduce the risk of them occurring again.  What do you need to know about Xarelto? The starting dose is one 15 mg tablet taken TWICE daily with food for the FIRST 21 DAYS then on 11/14 OR after you have taken all of the 15 mg tablets  the dose is changed to one 20 mg tablet taken ONCE A DAY with your evening meal.  DO NOT stop taking Xarelto without talking to the health care provider who prescribed the medication.  Refill your prescription for 20 mg tablets before you run out.  After discharge, you should have regular check-up appointments with your healthcare provider that is prescribing your Xarelto.  In the future your dose may need to be changed if your kidney function changes by a significant amount.  What do you do if you miss a dose? If you are taking Xarelto TWICE DAILY and you miss a dose, take it as soon as you remember. You may take two 15 mg tablets (total 30 mg) at the same time then resume your regularly scheduled 15 mg twice daily the next day.  If you are taking Xarelto ONCE DAILY and you miss a dose, take it as soon as you remember on the same day then continue your regularly scheduled once daily regimen the next day. Do not take two doses of Xarelto at the same time.   Important Safety Information Xarelto is a blood thinner medicine that can cause bleeding. You should call your healthcare provider right away if you experience any of the following: ? Bleeding from an injury or your nose that does not stop. ? Unusual colored urine (red or dark brown) or unusual colored stools (red or black). ? Unusual  bruising for unknown reasons. ? A serious fall or if you hit your head (even if there is no bleeding).  Some medicines may interact with Xarelto and might increase your risk of bleeding while on Xarelto. To help avoid this, consult your healthcare provider or pharmacist prior to using any new prescription or non-prescription medications, including herbals, vitamins, non-steroidal anti-inflammatory drugs (NSAIDs) and supplements.  This website has more information on Xarelto: [email or web address]

## 2019-09-05 LAB — LEGIONELLA PNEUMOPHILA SEROGP 1 UR AG: L. pneumophila Serogp 1 Ur Ag: NEGATIVE

## 2019-09-07 ENCOUNTER — Encounter: Payer: Self-pay | Admitting: *Deleted

## 2019-09-07 ENCOUNTER — Telehealth: Payer: Self-pay

## 2019-09-07 NOTE — Telephone Encounter (Signed)
Thanks for letting her know we are available to help.  I can certainly understand her desire given what she is going through

## 2019-09-07 NOTE — Telephone Encounter (Signed)
  Transition Care Management Follow-up Telephone Call  Date of discharge and from where: [facility] 09/04/19  How have you been since you were released from the hospital? stable  Any questions or concerns? No   Items Reviewed:  Did the pt receive and understand the discharge instructions provided? Yes   Medications obtained and verified? Yes   Any new allergies since your discharge? No   Dietary orders reviewed? Yes  Do you have support at home? Yes   Other (ie: DME, Home Health, etc) none   Functional Questionnaire: (I = Independent and D = Dependent) ADL's: I  Bathing/Dressing- I   Meal Prep- I  Eating- I  Maintaining continence- I  Transferring/Ambulation- I  Managing Meds- I    Follow up appointments reviewed:    PCP Hospital f/u appt confirmed? Patient would like to hold on scheduling at this time; would like to see specialists first    [facility] f/u appt confirmed? Yes  Scheduled to see Oncology on 09/10/19    Are transportation arrangements needed? No   If their condition worsens, is the pt aware to call  their PCP or go to the ED? Yes  Was the patient provided with contact information for the PCP's office or ED? Yes  Was the pt encouraged to call back with questions or concerns? Yes

## 2019-09-08 ENCOUNTER — Other Ambulatory Visit: Payer: Medicare Other

## 2019-09-08 ENCOUNTER — Inpatient Hospital Stay: Payer: Medicare Other | Admitting: Hematology

## 2019-09-08 NOTE — Progress Notes (Signed)
[place] OFFICE PROGRESS NOTE  Patient Care Team: Marin Olp, MD as PCP - General (Family Medicine) Kassie Mends, RN as [place] [facility]  HEME/ONC OVERVIEW: 1. 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 Hilar, precarinal and subcarinal adenopathy.  Likely numerous benign liver cysts, suspicious L3 and right iliac bone lesions on MRI abdomen, concerning for mets   No intra-cranial abnormalities on MRI brain   Bronch w/ bx of the RUL mass, brushing positive for squamous cell carcinoma   2. 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  However, it also demonstrated suspicious L3 and right iliac bone lesions, concerning for metastatic disease.  MRI brain was negative for metastasis.  -I reviewed the MRI results in detail with the patient -Given the questionable bony lesions on MRI, I have ordered PET scan for further evaluation -I have also requested molecular studies ([place]us) and PD-L1 to assess for any targetable mutations; as EBUS only obtained limited specimen, we will prioritize PD-L1 on the biopsy, and use peripheral blood for NGS if there is insufficient tissue for further testing  -Pending the PET scan results, we will determine the next step   Acute LLL  PTE and bilateral DVT -Currently on Eliquis; patient denies any abnormal bleeding or bruising -Goal of anticoagulation is lifelong -Continue Xarelto 271mdaily   Normocytic anemia -Most likely multifactorial, hemodilution, anemia of chronic disease, and CKD -Hgb 9.2 today, slightly lower than recent hospitalization  -Patient denies any symptoms of bleeding -I have ordered iron panel, including soluble transferrin -We will monitor it for now   Diarrhea -New onset; 2-3x/day for ~1 week -As it started while she was hospitalized, I have ordered C. Diff stool study to rule out infection -I counseled the patient on the importance of maintaining adequate hydration   Tobacco abuse -Patient smoked 1 pack every 2 to 3 days for the least past 40 years; quit after recent hospitalization -I counseled the patient on the importance of abstinence from all tobacco products, especially in the setting of lung cancer -Patient expressed understanding, and agreed to the plan  Cough -Likely due to lung cancer -Patient recently completed a course of cefpodoxime  -No recurrent symptoms of infection -I have prescribed PRN Robitussin and Tessalon pearls for cough  Orders Placed This Encounter  Procedures  . C difficile quick screen w PCR reflex    Standing Status:   Future    Number of Occurrences:   1    Standing Expiration Date:   09/09/2020  . NM PET Image Initial (PI) Skull Base To Thigh    Standing Status:   Future    Standing Expiration Date:   09/09/2020    Order Specific Question:   If indicated for the ordered procedure, I authorize the administration of a radiopharmaceutical per  Radiology protocol    Answer:   Yes    Order Specific Question:   Preferred imaging location?    Answer:   [facility]    Order Specific Question:   Radiology Contrast Protocol - do NOT remove file path    Answer:   [email or web address]  . IR IMAGING GUIDED PORT INSERTION     Standing Status:   Future    Standing Expiration Date:   11/09/2020    Order Specific Question:   Reason for Exam (SYMPTOM  OR DIAGNOSIS REQUIRED)    Answer:   chemo access for lung cancer    Order Specific Question:   Preferred Imaging Location?    Answer:   [facility]  . Ferritin    Standing Status:   Future    Number of Occurrences:   1    Standing Expiration Date:   10/14/2020  . Iron and TIBC    Standing Status:   Future    Number of Occurrences:   1    Standing Expiration Date:   10/14/2020  . Soluble transferrin receptor    Standing Status:   Future    Number of Occurrences:   1    Standing Expiration Date:   09/09/2020   A total of more than 60 minutes were spent face-to-face with the patient during this encounter and over half of that time was spent on counseling and coordination of care as outlined above.  All questions were answered. The patient knows to call the clinic with any problems, questions or concerns. No barriers to learning was detected.  Return in 2 weeks for labs, imaging results, and clinic appt.   [name], MD 09/10/2019 2:33 PM  CHIEF COMPLAINT: "I am having diarrhea"  INTERVAL HISTORY: Brianna Saunders returns to clinic for follow-up of squamous of carcinoma of the right lung after recent hospitalization.  Patient reports that while she was in the hospital, she developed a new onset diarrhea, 2-3 times per day, small volume, without any associated abdominal pain, nausea, hematochezia, or melena.  She has not yet taken any over-the-counter antidiarrheals.  She also reports mild persistent dry cough without any fever, chest pain, sputum production, or hemoptysis.  She has mild to moderate exertional shortness of breath, which is relatively stable.  Due to the diarrhea, she notices some blood spots on the toilet paper after the bowel movement.  She denies any other complaint today.  REVIEW OF SYSTEMS:   Constitutional: ( - ) fevers, ( - )  chills , ( - )  night sweats Eyes: ( - ) blurriness of vision, ( - ) double vision, ( - ) watery eyes Ears, nose, mouth, throat, and face: ( - ) mucositis, ( - ) sore throat Respiratory: ( + ) cough, ( + ) dyspnea, ( - ) wheezes Cardiovascular: ( - ) palpitation, ( - ) chest discomfort, ( - ) lower extremity swelling Gastrointestinal:  ( - ) nausea, ( - ) heartburn, ( + ) change in bowel habits Skin: ( - ) abnormal skin rashes Lymphatics: ( - ) new lymphadenopathy, ( - ) easy bruising Neurological: ( - ) numbness, ( - ) tingling, ( - ) new weaknesses Behavioral/Psych: ( - ) mood change, ( - ) new changes  All other systems were reviewed with the patient and are negative.  SUMMARY OF ONCOLOGIC HISTORY: Oncology History  Squamous cell carcinoma of lung, right ([place])  08/31/2019 Initial Diagnosis   Squamous cell carcinoma of  lung, right ([place])   08/31/2019 Imaging   CT chest w/ contrast: IMPRESSION: 1. Large poorly defined partially cavitary mass in the right upper lobe measuring approximately 4.1 x 6.4 x 3.8 cm, contiguous with ill-defined mass and/or adenopathy within the mediastinum. 1.9 cm pedunculated filling defect within the right aspect of the left atrium, appears contiguous with mediastinal soft tissue density and is concerning for left atrial invasion. There is narrowing and partial encasement of right upper lobe pulmonary arteries. 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 2. Small nonocclusive thrombus within subsegmental left lower lobe pulmonary artery. 3. 12 mm irregular right upper lobe pulmonary nodule which is either infectious, inflammatory, or neoplastic. Multifocal ill-defined consolidations in the right upper and  middle lobes with diffuse ground-glass density in the right lower lobe, findings could be secondary to pneumonia, with consideration given to associated pulmonary infarcts given occluded appearance of right middle and lower lobe pulmonary arteries. 4. Numerous hypodense liver lesions, some of which are consistent with cysts. Given findings in the chest, recommend correlation with nonemergent MRI. 5. Incompletely visualized age indeterminate fracture inferior endplate T12 6. Small pericardial effusion   09/01/2019 Imaging   Doppler: Summary: Right: Findings consistent with acute deep vein thrombosis involving the right posterior tibial veins. No cystic structure found in the popliteal fossa. Left: Findings consistent with acute deep vein thrombosis involving the left popliteal vein. No cystic structure found in the popliteal fossa.   09/02/2019 Imaging   MRI brain w/ contrast:IMPRESSION: Negative for metastatic disease to the brain.  No acute abnormality.   09/02/2019 Pathology Results   FINAL MICROSCOPIC DIAGNOSIS:   A. LUNG, RIGHT UPPER LOBE, BIOPSY:  - Benign bronchial epithelium.    09/02/2019 Pathology Results   Clinical History: Transbronchial brushings, RUL  Specimen Submitted:  A. LUNG, RIGHT UPPER LOBE, BRUSHINGS:    FINAL MICROSCOPIC DIAGNOSIS:  - Malignant cells consistent with squamous cell carcinoma    09/02/2019 Imaging   Echocardiogram: IMPRESSIONS      1. Left ventricular ejection fraction, by visual estimation, is 60 to 65%. The left ventricle has normal function. There is mildly increased left ventricular hypertrophy.  2. Left ventricular diastolic Doppler parameters are consistent with impaired relaxation pattern of LV diastolic filling.  3. Global right ventricle has mildly reduced systolic function.The right ventricular size is normal. No increase in right ventricular wall thickness.  4. Left atrial size was normal.  5. Right atrial size was normal.   6. The mitral valve is grossly normal. Trace mitral valve regurgitation.  7. The tricuspid valve is grossly normal. Tricuspid valve regurgitation was not visualized by color flow Doppler.  8. The aortic valve is tricuspid Aortic valve regurgitation was not visualized by color flow Doppler.  9. The pulmonic valve was grossly normal. Pulmonic valve regurgitation is not visualized by color flow Doppler. 10. The inferior vena cava is normal in size with <50% respiratory variability, suggesting right atrial pressure of 8 mmHg. 11. Small pericardial effusion. 12. The pericardial effusion is RV - inferior wall.   09/03/2019 Imaging   MRI abdomen: IMPRESSION: 1. Severely motion degraded MRI, substantially limiting evaluation. 2. Enhancing L3 vertebral and medial right iliac T2 hypointense bone lesions, concerning for bone metastases. PET-CT strongly recommended on a short term outpatient basis for  further characterization. 3. Numerous T2 hyperintense lesions scattered throughout the liver, without definite enhancement on the severely motion degraded postcontrast sequences, presumably benign liver cysts. The liver can be better staged on PET-CT.     I have reviewed the past medical history, past surgical history, social history and family history with the patient and they are unchanged from previous note.  ALLERGIES:  has No Known Allergies.  MEDICATIONS:  Current Outpatient Medications  Medication Sig Dispense Refill  . amLODipine (NORVASC) 10 MG tablet Take 1 tablet (10 mg total) by mouth daily. 90 tablet 1  . baclofen (LIORESAL) 10 MG tablet TAKE 1 TABLET BY MOUTH THREE TIMES A DAY AS NEEDED FOR MUSCLE SPASMS (Patient taking differently: Take 10 mg by mouth 3 (three) times daily. ) 270 tablet 1  . benzonatate (TESSALON) 100 MG capsule Take 1 capsule (100 mg total) by mouth 3 (three) times daily as needed for cough. 60 capsule 2  . buPROPion (WELLBUTRIN XL) 150 MG 24 hr tablet Take 1 tablet  (150 mg total) by mouth daily.    . citalopram (CELEXA) 40 MG tablet Take 1 tablet (40 mg total) by mouth daily. 90 tablet 1  . gabapentin (NEURONTIN) 300 MG capsule Take 2 capsules (600 mg total) by mouth 3 (three) times daily. 180 capsule 1  . guaiFENesin-dextromethorphan (ROBITUSSIN DM) 100-10 MG/5ML syrup Take 5 mLs by mouth every 4 (four) hours as needed for cough. 120 mL 1  . metoprolol tartrate (LOPRESSOR) 50 MG tablet Take 1 tablet (50 mg total) by mouth 2 (two) times daily. 180 tablet 1  . nicotine (NICODERM CQ - DOSED IN MG/24 HOURS) 14 mg/24hr patch Place 1 patch (14 mg total) onto the skin daily. 28 patch 1  . [START ON 09/25/2019] rivaroxaban (XARELTO) 20 MG TABS tablet Take 1 tablet (20 mg total) by mouth daily with supper. Please start this after you have completed the starter pack. 30 tablet 4  . Rivaroxaban 15 & 20 MG TBPK Follow package directions: Take one '15mg'$  tablet by mouth twice a day. On day 22 (09/25/19), switch to one '20mg'$  tablet once a day. Take with food. 51 each 0  . traMADol (ULTRAM) 50 MG tablet Take 1 tablet (50 mg total) by mouth every 8 (eight) hours as needed for moderate pain or severe pain. 20 tablet 0  . Vitamin D, Ergocalciferol, (DRISDOL) 1.25 MG (50000 UT) CAPS capsule TAKE 1 CAPSULE (50,000 UNITS TOTAL) BY MOUTH EVERY 7 (SEVEN) DAYS. 12 capsule 0   No current facility-administered medications for this visit.     PHYSICAL EXAMINATION: ECOG PERFORMANCE STATUS: 2 - Symptomatic, <50% confined to bed  Today's Vitals   09/10/19 1346 09/10/19 1347  BP: 128/69   Pulse: (!) 101   Resp: 17   Temp: 97.8 F (36.6 C)   TempSrc: Oral   SpO2: 95%   Weight: 156 lb (70.8 kg)   Height: '5\' 4"'$  (1.626 m)   PainSc:  1    Body mass index is 26.78 kg/m.  Filed Weights   09/10/19 1346  Weight: 156 lb (70.8 kg)    GENERAL: alert, no distress and comfortable SKIN: skin color, texture, turgor are normal, no rashes or significant lesions EYES: conjunctiva are pink  and non-injected, sclera clear OROPHARYNX: no exudate, no erythema; lips, buccal mucosa, and tongue normal; denture in place  NECK: supple, non-tender LUNGS: clear to auscultation with normal breathing effort HEART: regular rate & rhythm and no murmurs and no lower extremity edema ABDOMEN:  soft, non-tender, non-distended, normal bowel sounds Musculoskeletal: no cyanosis of digits and no clubbing  PSYCH: alert & oriented x 3, fluent speech  LABORATORY DATA:  I have reviewed the data as listed    Component Value Date/Time   NA 139 09/10/2019 1324   K 4.0 09/10/2019 1324   CL 104 09/10/2019 1324   CO2 25 09/10/2019 1324   GLUCOSE 110 (H) 09/10/2019 1324   BUN 16 09/10/2019 1324   CREATININE 1.14 (H) 09/10/2019 1324   CALCIUM 9.8 09/10/2019 1324   PROT 7.9 09/10/2019 1324   ALBUMIN 4.2 09/10/2019 1324   AST 15 09/10/2019 1324   ALT 12 09/10/2019 1324   ALKPHOS 60 09/10/2019 1324   BILITOT 0.4 09/10/2019 1324   GFRNONAA 51 (L) 09/10/2019 1324   GFRAA 59 (L) 09/10/2019 1324    No results found for: SPEP, UPEP  Lab Results  Component Value Date   WBC 11.7 (H) 09/10/2019   NEUTROABS 7.5 09/10/2019   HGB 9.2 (L) 09/10/2019   HCT 30.0 (L) 09/10/2019   MCV 82.6 09/10/2019   PLT 401 (H) 09/10/2019      Chemistry      Component Value Date/Time   NA 139 09/10/2019 1324   K 4.0 09/10/2019 1324   CL 104 09/10/2019 1324   CO2 25 09/10/2019 1324   BUN 16 09/10/2019 1324   CREATININE 1.14 (H) 09/10/2019 1324      Component Value Date/Time   CALCIUM 9.8 09/10/2019 1324   ALKPHOS 60 09/10/2019 1324   AST 15 09/10/2019 1324   ALT 12 09/10/2019 1324   BILITOT 0.4 09/10/2019 1324       RADIOGRAPHIC STUDIES: I have personally reviewed the radiological images as listed below and agreed with the findings in the report. Dg Chest 2 View  Result Date: 08/31/2019 CLINICAL DATA:  Chest pain, shortness of breath. EXAM: CHEST - 2 VIEW COMPARISON:  None. FINDINGS: The heart size and  mediastinal contours are within normal limits. No pneumothorax or pleural effusion is noted. Left lung is clear. Right midlung and lower lobe opacities are noted concerning for atelectasis or possibly infiltrates. There is noted right perihilar rounded opacity concerning for possible neoplasm. The visualized skeletal structures are unremarkable. IMPRESSION: Right perihilar rounded opacity is noted concerning for possible neoplasm or malignancy. More peripheral opacities are noted concerning for inflammation or possible atelectasis. CT scan of the chest with intravenous contrast is recommended for further evaluation. Electronically Signed   By: Marijo Conception M.D.   On: 08/31/2019 16:55   Ct Chest W Contrast  Result Date: 08/31/2019 CLINICAL DATA:  Abnormal chest x-ray shortness of breath EXAM: CT CHEST WITH CONTRAST TECHNIQUE: Multidetector CT imaging of the chest was performed during intravenous contrast administration. CONTRAST:  12m OMNIPAQUE IOHEXOL 300 MG/ML  SOLN COMPARISON:  Chest x-ray 08/31/2019 FINDINGS: Cardiovascular: Nonaneurysmal aorta. Moderate aortic atherosclerosis. No dissection seen. Slightly enlarged pulmonary trunk up to 3.5 cm. Borderline cardiomegaly. Small pericardial effusion measuring up to 14 mm in thickness. 19 mm lobulated filling defect within the right side of the left atrium. Appears contiguous with soft tissue mass in the mediastinum. Coronary vascular calcification. Suspected small nonocclusive filling defect within subsegmental left lower lobe pulmonary artery, series 2, image number 103 and 104. Abduct truncation of the descending right pulmonary artery with essentially no significant enhancement of right middle or lower lobe pulmonary vessels. Narrowing of right upper lobe pulmonary arterial vessels by mediastinal soft tissue mass. Mediastinum/Nodes: Midline trachea. Subcentimeter hypodense thyroid  nodules. Coarse calcifications in the thyroid. Esophagus within normal  limits. Enlarged precarinal node measuring 16 mm. Subcarinal node measuring 14 mm. Ill-defined soft tissue density within the right hilus, encasing right upper lobe pulmonary vessels. Lungs/Pleura: Mild emphysema. Partially cavitary mass within the right upper lobe measuring approximately 4.1 cm x 6.4 cm by 3.8 cm. This is contiguous with ill-defined soft tissue mass/adenopathy within the mediastinum 12 mm irregular nodule in the right upper lobe, series 5, image number 49. Ill-defined consolidations within the right upper lobe and the right middle lobe. Diffuse ground-glass density in the right lower lobe with small consolidations. No pleural effusion or pneumothorax. Upper Abdomen: Numerous hypodense liver lesions, some of which are consistent with cysts. Atrophic partially visualized right kidney. Musculoskeletal: Incompletely visualized age indeterminate fracture at the anterior inferior endplate T12 IMPRESSION: 1. Large poorly defined partially cavitary mass in the right upper lobe measuring approximately 4.1 x 6.4 x 3.8 cm, contiguous with ill-defined mass and/or adenopathy within the mediastinum. 1.9 cm pedunculated filling defect within the right aspect of the left atrium, appears contiguous with mediastinal soft tissue density and is concerning for left atrial invasion. There is narrowing and partial encasement of right upper lobe pulmonary arteries. 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 2. Small nonocclusive thrombus within subsegmental left lower lobe pulmonary artery. 3. 12 mm irregular right upper lobe pulmonary nodule which is either infectious, inflammatory, or neoplastic. Multifocal ill-defined consolidations in the right upper and  middle lobes with diffuse ground-glass density in the right lower lobe, findings could be secondary to pneumonia, with consideration given to associated pulmonary infarcts given occluded appearance of right middle and lower lobe pulmonary arteries. 4. Numerous hypodense liver lesions, some of which are consistent with cysts. Given findings in the chest, recommend correlation with nonemergent MRI. 5. Incompletely visualized age indeterminate fracture inferior endplate T12 6. Small pericardial effusion Critical Value/emergent results were called by telephone at the time of interpretation on 08/31/2019 at 7:54 pm to providerELLIOTT [facility] , who verbally acknowledged these results. Aortic Atherosclerosis (ICD10-I70.0) and Emphysema (ICD10-J43.9). Electronically Signed   By: Donavan Foil M.D.   On: 08/31/2019 19:54   Mr Jeri Cos AY Contrast  Result Date: 09/02/2019 CLINICAL DATA:  Small cell lung cancer staging EXAM: MRI HEAD WITHOUT AND WITH CONTRAST TECHNIQUE: Multiplanar, multiecho pulse sequences of the brain and surrounding structures were obtained without and with intravenous contrast. CONTRAST:  15m GADAVIST GADOBUTROL 1 MMOL/ML IV SOLN COMPARISON:  None. FINDINGS: Brain: No acute infarction, hemorrhage, hydrocephalus, extra-axial collection or mass lesion. Few small white matter hyperintensities bilaterally likely due to chronic ischemia. No enhancing metastatic deposits in the brain. Leptomeningeal enhancement normal. Vascular: Normal arterial flow voids. 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 Skull and upper cervical spine: Negative Sinuses/Orbits: Negative Other: 6 x 18 mm cyst right temporal subcutaneous tissues. No enhancement. Probable benign dermal cyst. IMPRESSION: Negative for metastatic disease to the brain.  No acute abnormality. Electronically Signed   By: CFranchot GalloM.D.   On: 09/02/2019 21:43   Mr Abdomen W WOKContrast  Result  Date: 09/03/2019 CLINICAL DATA:  Inpatient. New bronchoscopic diagnosis of right upper lobe squamous cell lung cancer. Indeterminate liver lesions on recent chest CT. EXAM: MRI ABDOMEN  WITHOUT AND WITH CONTRAST TECHNIQUE: Multiplanar multisequence MR imaging of the abdomen was performed both before and after the administration of intravenous contrast. CONTRAST:  62m GADAVIST GADOBUTROL 1 MMOL/ML IV SOLN COMPARISON:  08/31/2019 chest CT. FINDINGS: Images are severely motion degraded, significantly limiting assessment. Lower chest: Hazy patchy right lung base opacity, correlating with patchy ground-glass opacity and consolidation at the right lung base on recent chest CT. Hepatobiliary: Normal liver size and configuration. No hepatic steatosis. Numerous homogeneous circumscribed T2 hyperintense lesions scattered throughout the liver, without definite enhancement on the severely motion degraded postcontrast sequences. Gallbladder appears contracted and is poorly evaluated due to motion degradation. No biliary ductal dilatation. Common bile duct diameter 3 mm. No evidence of choledocholithiasis. Pancreas: No pancreatic mass or duct dilation.  No pancreas divisum. Spleen: Normal size. No mass. Adrenals/Urinary Tract: No discrete adrenal lesions. Asymmetric moderate right renal atrophy. No hydronephrosis. No suspicious renal masses. Subcentimeter simple renal cyst in the anterior interpolar left kidney. Stomach/Bowel: Normal non-distended stomach. Visualized small and large bowel is normal caliber, with no bowel wall thickening. Vascular/Lymphatic: Atherosclerotic nonaneurysmal abdominal aorta. Patent portal, splenic, hepatic and renal veins. No pathologically enlarged lymph nodes in the abdomen. Other: No abdominal ascites or focal fluid collection. Musculoskeletal: There is a small enhancing 1.1 cm T2 hypointense inferior L3 vertebral lesion (series 6/image 18). There is a similar enhancing 1.7 cm T2 hypointense medial  right iliac bone lesion (series 6/image 11). IMPRESSION: 1. Severely motion degraded MRI, substantially limiting evaluation. 2. Enhancing L3 vertebral and medial right iliac T2 hypointense bone lesions, concerning for bone metastases. PET-CT strongly recommended on a short term outpatient basis for further characterization. 3. Numerous T2 hyperintense lesions scattered throughout the liver, without definite enhancement on the severely motion degraded postcontrast sequences, presumably benign liver cysts. The liver can be better staged on PET-CT. Electronically Signed   By: JIlona SorrelM.D.   On: 09/03/2019 20:06   Dg Chest Port 1 View  Result Date: 09/02/2019 CLINICAL DATA:  Status post bronchoscopy EXAM: PORTABLE CHEST 1 VIEW COMPARISON:  08/31/2019 FINDINGS: Cardiac shadow is stable. Persistent mass lesion in the right upper lobe is noted adjacent to the right hilum with hilar adenopathy identified. No post bronchoscopy pneumothorax is seen. No other focal abnormality is seen. IMPRESSION: Stable appearing right lung mass with hilar adenopathy. No post bronchoscopy pneumothorax is seen. Electronically Signed   By: MInez CatalinaM.D.   On: 09/02/2019 08:40   Vas UKoreaLower Extremity Venous (dvt)  Result Date: 09/02/2019  Lower Venous Study Indications: Pulmonary embolism.  Risk Factors: None identified. Limitations: Poor ultrasound/tissue interface and patient positioning, patient movement. Comparison Study: No prior studies. Performing Technologist: GOliver HumRVT  Examination Guidelines: A complete evaluation includes B-mode imaging, spectral Doppler, color Doppler, and power Doppler as needed of all accessible portions of each vessel. Bilateral testing is considered an integral part of a complete examination. Limited examinations for reoccurring indications may be performed as noted.  +---------+---------------+---------+-----------+----------+--------------+ RIGHT     CompressibilityPhasicitySpontaneityPropertiesThrombus Aging +---------+---------------+---------+-----------+----------+--------------+ CFV      Full           Yes      Yes                                 +---------+---------------+---------+-----------+----------+--------------+ SFJ      Full                                                        +---------+---------------+---------+-----------+----------+--------------+  FV Prox  Full                                                        +---------+---------------+---------+-----------+----------+--------------+ FV Mid   Full                                                        +---------+---------------+---------+-----------+----------+--------------+ FV DistalFull                                                        +---------+---------------+---------+-----------+----------+--------------+ PFV      Full                                                        +---------+---------------+---------+-----------+----------+--------------+ POP      Full           Yes      Yes                                 +---------+---------------+---------+-----------+----------+--------------+ PTV      Partial                                      Acute          +---------+---------------+---------+-----------+----------+--------------+ PERO     Full                                                        +---------+---------------+---------+-----------+----------+--------------+   +---------+---------------+---------+-----------+----------+--------------+ LEFT     CompressibilityPhasicitySpontaneityPropertiesThrombus Aging +---------+---------------+---------+-----------+----------+--------------+ CFV      Full           Yes      Yes                                 +---------+---------------+---------+-----------+----------+--------------+ SFJ      Full                                                         +---------+---------------+---------+-----------+----------+--------------+ FV Prox  Full                                                        +---------+---------------+---------+-----------+----------+--------------+  FV Mid   Full                                                        +---------+---------------+---------+-----------+----------+--------------+ FV DistalFull                                                        +---------+---------------+---------+-----------+----------+--------------+ PFV      Full                                                        +---------+---------------+---------+-----------+----------+--------------+ POP      Partial        Yes      Yes                  Acute          +---------+---------------+---------+-----------+----------+--------------+ PTV      Full                                                        +---------+---------------+---------+-----------+----------+--------------+ PERO     Full                                                        +---------+---------------+---------+-----------+----------+--------------+ Gastroc  Full                                                        +---------+---------------+---------+-----------+----------+--------------+ SSV      Full                                                        +---------+---------------+---------+-----------+----------+--------------+     Summary: Right: Findings consistent with acute deep vein thrombosis involving the right posterior tibial veins. No cystic structure found in the popliteal fossa. Left: Findings consistent with acute deep vein thrombosis involving the left popliteal vein. No cystic structure found in the popliteal fossa.  *See table(s) above for measurements and observations. Electronically signed by Servando Snare MD on 09/02/2019 at 2:17:01 PM.    Final    Dg C-arm Bronchoscopy  Result Date:  09/02/2019 C-ARM BRONCHOSCOPY: Fluoroscopy was utilized by the requesting physician.  No radiographic interpretation.

## 2019-09-09 ENCOUNTER — Other Ambulatory Visit: Payer: Self-pay | Admitting: Hematology

## 2019-09-09 ENCOUNTER — Other Ambulatory Visit: Payer: Self-pay | Admitting: *Deleted

## 2019-09-09 DIAGNOSIS — C3491 Malignant neoplasm of unspecified part of right bronchus or lung: Secondary | ICD-10-CM

## 2019-09-09 NOTE — Progress Notes (Signed)
The proposed treatment discussed in cancer conference 09/09/19 is for discussion purpose only and not a binding recommendation.  The patient was not physically examined nor present for their treatment options.  Therefore, final treatment plans cannot be decided.

## 2019-09-10 ENCOUNTER — Telehealth: Payer: Self-pay | Admitting: *Deleted

## 2019-09-10 ENCOUNTER — Other Ambulatory Visit: Payer: Self-pay

## 2019-09-10 ENCOUNTER — Encounter: Payer: Self-pay | Admitting: Hematology

## 2019-09-10 ENCOUNTER — Inpatient Hospital Stay: Payer: Medicare Other | Attending: Hematology | Admitting: Hematology

## 2019-09-10 ENCOUNTER — Other Ambulatory Visit: Payer: Self-pay | Admitting: *Deleted

## 2019-09-10 ENCOUNTER — Inpatient Hospital Stay: Payer: Medicare Other

## 2019-09-10 VITALS — BP 128/69 | HR 101 | Temp 97.8°F | Resp 17 | Ht 64.0 in | Wt 156.0 lb

## 2019-09-10 DIAGNOSIS — I82403 Acute embolism and thrombosis of unspecified deep veins of lower extremity, bilateral: Secondary | ICD-10-CM | POA: Insufficient documentation

## 2019-09-10 DIAGNOSIS — Z720 Tobacco use: Secondary | ICD-10-CM | POA: Diagnosis not present

## 2019-09-10 DIAGNOSIS — I2693 Single subsegmental pulmonary embolism without acute cor pulmonale: Secondary | ICD-10-CM | POA: Diagnosis not present

## 2019-09-10 DIAGNOSIS — C3491 Malignant neoplasm of unspecified part of right bronchus or lung: Secondary | ICD-10-CM

## 2019-09-10 DIAGNOSIS — D649 Anemia, unspecified: Secondary | ICD-10-CM | POA: Insufficient documentation

## 2019-09-10 DIAGNOSIS — Z79899 Other long term (current) drug therapy: Secondary | ICD-10-CM | POA: Insufficient documentation

## 2019-09-10 DIAGNOSIS — Z7901 Long term (current) use of anticoagulants: Secondary | ICD-10-CM | POA: Insufficient documentation

## 2019-09-10 DIAGNOSIS — C3490 Malignant neoplasm of unspecified part of unspecified bronchus or lung: Secondary | ICD-10-CM

## 2019-09-10 DIAGNOSIS — C3411 Malignant neoplasm of upper lobe, right bronchus or lung: Secondary | ICD-10-CM | POA: Insufficient documentation

## 2019-09-10 DIAGNOSIS — R197 Diarrhea, unspecified: Secondary | ICD-10-CM | POA: Diagnosis not present

## 2019-09-10 DIAGNOSIS — R05 Cough: Secondary | ICD-10-CM | POA: Diagnosis not present

## 2019-09-10 DIAGNOSIS — R059 Cough, unspecified: Secondary | ICD-10-CM

## 2019-09-10 LAB — CMP (CANCER CENTER ONLY)
ALT: 12 U/L (ref 0–44)
AST: 15 U/L (ref 15–41)
Albumin: 4.2 g/dL (ref 3.5–5.0)
Alkaline Phosphatase: 60 U/L (ref 38–126)
Anion gap: 10 (ref 5–15)
BUN: 16 mg/dL (ref 8–23)
CO2: 25 mmol/L (ref 22–32)
Calcium: 9.8 mg/dL (ref 8.9–10.3)
Chloride: 104 mmol/L (ref 98–111)
Creatinine: 1.14 mg/dL — ABNORMAL HIGH (ref 0.44–1.00)
GFR, Est AFR Am: 59 mL/min — ABNORMAL LOW (ref >60)
GFR, Estimated: 51 mL/min — ABNORMAL LOW (ref >60)
Glucose, Bld: 110 mg/dL — ABNORMAL HIGH (ref 70–99)
Potassium: 4 mmol/L (ref 3.5–5.1)
Sodium: 139 mmol/L (ref 135–145)
Total Bilirubin: 0.4 mg/dL (ref 0.3–1.2)
Total Protein: 7.9 g/dL (ref 6.5–8.1)

## 2019-09-10 LAB — CBC WITH DIFFERENTIAL (CANCER CENTER ONLY)
Abs Immature Granulocytes: 0.05 10*3/uL (ref 0.00–0.07)
Basophils Absolute: 0 10*3/uL (ref 0.0–0.1)
Basophils Relative: 0 %
Eosinophils Absolute: 0.4 10*3/uL (ref 0.0–0.5)
Eosinophils Relative: 4 %
HCT: 30 % — ABNORMAL LOW (ref 36.0–46.0)
Hemoglobin: 9.2 g/dL — ABNORMAL LOW (ref 12.0–15.0)
Immature Granulocytes: 0 %
Lymphocytes Relative: 23 %
Lymphs Abs: 2.7 10*3/uL (ref 0.7–4.0)
MCH: 25.3 pg — ABNORMAL LOW (ref 26.0–34.0)
MCHC: 30.7 g/dL (ref 30.0–36.0)
MCV: 82.6 fL (ref 80.0–100.0)
Monocytes Absolute: 1 10*3/uL (ref 0.1–1.0)
Monocytes Relative: 8 %
Neutro Abs: 7.5 10*3/uL (ref 1.7–7.7)
Neutrophils Relative %: 65 %
Platelet Count: 401 10*3/uL — ABNORMAL HIGH (ref 150–400)
RBC: 3.63 MIL/uL — ABNORMAL LOW (ref 3.87–5.11)
RDW: 18.2 % — ABNORMAL HIGH (ref 11.5–15.5)
WBC Count: 11.7 10*3/uL — ABNORMAL HIGH (ref 4.0–10.5)
nRBC: 0 % (ref 0.0–0.2)

## 2019-09-10 LAB — C DIFFICILE QUICK SCREEN W PCR REFLEX
C Diff antigen: NEGATIVE
C Diff interpretation: NOT DETECTED
C Diff toxin: NEGATIVE

## 2019-09-10 MED ORDER — GUAIFENESIN-DM 100-10 MG/5ML PO SYRP: 5.0000 mL | ORAL_SOLUTION | ORAL | 1 refills | Status: AC | PRN

## 2019-09-10 MED ORDER — BENZONATATE 100 MG PO CAPS: 100.0000 mg | ORAL_CAPSULE | Freq: Three times a day (TID) | ORAL | 2 refills | Status: DC | PRN

## 2019-09-10 NOTE — Patient Outreach (Addendum)
Red EMMI flags received for 09/09/19 for sad, hopeless, questions DC instructions and other questions, outreach call to pt, spoke with pt, pt reports she is to see doctor today, states " it's written down which one to see, I can't pronounce his name" (pt to see Dr. [name] oncology).  RN CM addressed EMMI flags and pt states " I"ve had depression for years, it comes and goes, my doctor knows"  Pt states if any concerns related to depression she will discuss with doctor, pt states she is on medication for depression,  Pt states she had questions about who she was to see today and had been waiting on call back but states everything is straightened out. Pt reports she has all medications and taking as prescribed.  Pt states no further concerns, questions.  Jacqlyn Larsen [facility], [place] Coordinator [phone]

## 2019-09-10 NOTE — Telephone Encounter (Signed)
Call to [facility] at [facility] Pathology w/ request for PDL-1 to be done on pt from cytology taken 09/02/2019.

## 2019-09-10 NOTE — Patient Instructions (Signed)
Blood Transfusion, Adult A blood transfusion is a procedure in which you are given blood through an IV tube. You may need this procedure because of:  Illness.  Surgery.  Injury. The blood may come from someone else (a donor). You may also be able to donate blood for yourself (autologous blood donation). The blood given in a transfusion is made up of different types of cells. You may get:  Red blood cells. These carry oxygen to the cells in the body.  White blood cells. These help you fight infections.  Platelets. These help your blood to clot.  Plasma. This is the liquid part of your blood. It helps with fluid imbalances. If you have a clotting disorder, you may also get other types of blood products. What happens before the procedure?  You will have a blood test to find out your blood type. The test also finds out what type of blood your body will accept and matches it to the donor type.  If you are going to have a planned surgery, you may be able to donate your own blood. This may be done in case you need a transfusion.  If you have had an allergic reaction to a transfusion in the past, you may be given medicine to help prevent a reaction. This medicine may be given to you by mouth or through an IV.  You will have your temperature, blood pressure, and pulse checked.  Follow instructions from your doctor about what you cannot eat or drink.  Ask your doctor about: ? Changing or stopping your regular medicines. This is important if you take diabetes medicines or blood thinners. ? Taking medicines such as aspirin and ibuprofen. These medicines can thin your blood. Do not take these medicines before your procedure if your doctor tells you not to. What happens during the procedure?  An IV tube will be put into one of your veins.  The bag of donated blood will be attached to your IV tube. Then, the blood will enter through your vein.  Your temperature, blood pressure, and pulse will  be checked regularly during the procedure. This is done to find early signs of a transfusion reaction.  If you have any signs or symptoms of a reaction, your transfusion will be stopped. You may also be given medicine.  When the transfusion is done, your IV tube will be taken out.  Pressure may be applied to the IV site for a few minutes.  A bandage (dressing) will be put on the IV site. The procedure may vary among doctors and hospitals. What happens after the procedure?  Your temperature, blood pressure, heart rate, breathing rate, and blood oxygen level will be checked often.  Your blood may be tested to see how you are responding to the transfusion.  You may be warmed with fluids or blankets. This is done to keep the temperature of your body normal. Summary  A blood transfusion is a procedure in which you are given blood through an IV tube.  The blood may come from someone else (a donor). You may also be able to donate blood for yourself.  If you have had an allergic reaction to a transfusion in the past, you may be given medicine to help prevent a reaction. This medicine may be given to you by mouth or through an IV tube.  Your temperature, blood pressure, heart rate, breathing rate, and blood oxygen level will be checked often.  Your blood may be tested to see  how you are responding to the transfusion. This information is not intended to replace advice given to you by your health care provider. Make sure you discuss any questions you have with your health care provider. Document Released: 01/24/2009 Document Revised: 12/18/2016 Document Reviewed: 06/21/2016 Elsevier Patient Education  2020 [employer].

## 2019-09-11 LAB — SOLUBLE TRANSFERRIN RECEPTOR: Transferrin Receptor: 25 nmol/L (ref 12.2–27.3)

## 2019-09-12 ENCOUNTER — Other Ambulatory Visit: Payer: Self-pay | Admitting: Family Medicine

## 2019-09-13 ENCOUNTER — Other Ambulatory Visit: Payer: Self-pay | Admitting: *Deleted

## 2019-09-13 ENCOUNTER — Encounter: Payer: Self-pay | Admitting: *Deleted

## 2019-09-13 ENCOUNTER — Encounter: Payer: Self-pay | Admitting: Family Medicine

## 2019-09-13 LAB — IRON AND TIBC
Iron: 18 ug/dL — ABNORMAL LOW (ref 41–142)
Saturation Ratios: 8 % — ABNORMAL LOW (ref 21–57)
TIBC: 221 ug/dL — ABNORMAL LOW (ref 236–444)
UIBC: 203 ug/dL (ref 120–384)

## 2019-09-13 LAB — FERRITIN: Ferritin: 125 ng/mL (ref 11–307)

## 2019-09-13 NOTE — Progress Notes (Signed)
Initial RN Navigator Patient Visit  Name: Brianna Saunders Date of Referral : 09/02/19 Diagnosis: Lung Cancer  Patient was seen by Dr Maylon Peppers on 09/10/19 while I was out of office.   Armanda Magic RN gave patient "Your Patient Navigator" handout which explains my role, areas in which I am able to help, and all the contact information for myself and the office. Also gave patient MD and Navigator business card. Reviewed with patient the general overview of expected course after initial diagnosis and time frame for all steps to be completed.  New patient packet given to patient which includes: orientation to office and staff; campus directory; education on My Chart and Advance Directives; and patient centered education on Lung Cancer.  After her new patient appointment, Dr Maylon Peppers placed orders for the following:  PET scan - scheduled 09/21/19 Additional Tissue Testing - [facility] peripheral blood. Call pathology and add PD-L1 on the bronch specimen. Armanda Magic completed both requests. Port Placement - scheduled 09/22/19  Called and spoke to patient's daughter, Brianna Saunders. She mentions that her mother is having significant SOB, especially with exertion, and dizziness when standing. She states her mother has always used a walker, but was able to perform all ADLs and home maintenance, but since coming home from the hospital, she is SOB, dizzy and easily fatigued. She spends most of the day resting. Confirmed that these symptoms were not acute and have been present since discharge from the hospital. Educated the daughter that these symptoms were likely a result of her mother's tumor and it's significant invasion. The symptoms will likely not improve until we can initiate treatment and shrink the tumor burden. Reviewed with Dr Maylon Peppers and at this time, he doesn't want to make any changes to the treatment plan. Sent patient some recommendations on management of shortness of breath from the [facility]  via My Chart.

## 2019-09-14 MED ORDER — TRAMADOL HCL 50 MG PO TABS: 50.0000 mg | ORAL_TABLET | Freq: Three times a day (TID) | ORAL | 0 refills | Status: DC | PRN

## 2019-09-20 ENCOUNTER — Encounter: Payer: Self-pay | Admitting: *Deleted

## 2019-09-20 NOTE — Progress Notes (Signed)
Daughter, Brianna Saunders, called and stated that her mom would like to wait to have port placed until after she can speak with Dr Maylon Peppers. She is nervous about moving forward, and would like to have the full picture prior to proceeding. Able to coordinate with IR to have her port placement rescheduled to after her appointment.  Dr Maylon Peppers notified of above.

## 2019-09-21 ENCOUNTER — Other Ambulatory Visit: Payer: Self-pay | Admitting: Hematology

## 2019-09-21 ENCOUNTER — Other Ambulatory Visit: Payer: Self-pay

## 2019-09-21 ENCOUNTER — Telehealth: Payer: Self-pay | Admitting: Pharmacist

## 2019-09-21 ENCOUNTER — Ambulatory Visit (HOSPITAL_COMMUNITY)
Admission: RE | Admit: 2019-09-21 | Discharge: 2019-09-21 | Disposition: A | Discharge status: Home / Self Care | Payer: Medicare Other | Source: Ambulatory Visit | Attending: Hematology | Admitting: Hematology

## 2019-09-21 ENCOUNTER — Ambulatory Visit (HOSPITAL_COMMUNITY): Admission: RE | Admit: 2019-09-21 | Payer: Medicare Other | Source: Ambulatory Visit

## 2019-09-21 DIAGNOSIS — Z79899 Other long term (current) drug therapy: Secondary | ICD-10-CM | POA: Insufficient documentation

## 2019-09-21 DIAGNOSIS — C3491 Malignant neoplasm of unspecified part of right bronchus or lung: Secondary | ICD-10-CM

## 2019-09-21 DIAGNOSIS — C3490 Malignant neoplasm of unspecified part of unspecified bronchus or lung: Secondary | ICD-10-CM | POA: Diagnosis not present

## 2019-09-21 DIAGNOSIS — J90 Pleural effusion, not elsewhere classified: Secondary | ICD-10-CM | POA: Diagnosis not present

## 2019-09-21 DIAGNOSIS — C7951 Secondary malignant neoplasm of bone: Secondary | ICD-10-CM | POA: Insufficient documentation

## 2019-09-21 DIAGNOSIS — C3411 Malignant neoplasm of upper lobe, right bronchus or lung: Secondary | ICD-10-CM | POA: Insufficient documentation

## 2019-09-21 DIAGNOSIS — C778 Secondary and unspecified malignant neoplasm of lymph nodes of multiple regions: Secondary | ICD-10-CM | POA: Insufficient documentation

## 2019-09-21 LAB — GLUCOSE, CAPILLARY: Glucose-Capillary: 92 mg/dL (ref 70–99)

## 2019-09-21 MED ORDER — OSIMERTINIB MESYLATE 80 MG PO TABS: 80.0000 mg | ORAL_TABLET | Freq: Every day | ORAL | 11 refills | Status: AC

## 2019-09-21 MED ORDER — OSIMERTINIB MESYLATE 80 MG PO TABS: 80.0000 mg | ORAL_TABLET | Freq: Every day | ORAL | 11 refills | Status: DC

## 2019-09-21 MED ORDER — FLUDEOXYGLUCOSE F - 18 (FDG) INJECTION
7.7000 | Freq: Once | INTRAVENOUS | Status: AC | PRN
  Administered 2019-09-21: 7.7 via INTRAVENOUS

## 2019-09-21 NOTE — Telephone Encounter (Signed)
Oral Oncology Pharmacy Student Encounter  Received new prescription for Tagrisso (osimertinib) for the treatment of squamous cell carcinoma of lung, EGFR mutation positive, planned duration until progression or unacceptable toxicity.  Labs from 09/10/2019 and Echo from 09/02/2019 assessed, no relevant abnormalities. Prescription dose and frequency assessed.   Current medication list in Epic reviewed, one DDI with Tagrisso identified: - Citalopram and Tagrisso are both QT-prolonging agents. Reviewed ECG from 08/31/2019, QTc 459 ms. Recommend follow-up ECG after starting Tagrisso.  Prescription has been e-scribed to the [facility] for benefits analysis and approval.  Oral Oncology Clinic will continue to follow for insurance authorization, copayment issues, initial counseling and start date.   Vallery Sa, PharmD Candidate [facility] Oral [place] [facility] [phone]  09/21/2019 2:49 PM

## 2019-09-21 NOTE — Telephone Encounter (Signed)
Oral Oncology Pharmacist Encounter   Received notification from [employer] that prior authorization for Tagrisso (osimertinib) is required.   PA submitted on [facility] [id number] Status is pending   Oral Oncology Clinic will continue to follow.   Darl Pikes, PharmD, BCPS, [facility] Hematology/Oncology Clinical Pharmacist [facility] Oral [place] [facility] [phone]  09/21/2019 4:02 PM

## 2019-09-22 ENCOUNTER — Ambulatory Visit (HOSPITAL_COMMUNITY): Payer: Medicare Other

## 2019-09-22 ENCOUNTER — Other Ambulatory Visit (HOSPITAL_COMMUNITY): Payer: Medicare Other

## 2019-09-22 NOTE — Telephone Encounter (Signed)
Oral Oncology Pharmacist Encounter   Prior Authorization for [employer] has been approved.     Effective dates: 06/24/2019 through 09/21/2022  Copay $8.95   Oral Oncology Clinic will continue to follow.   Darl Pikes, PharmD, BCPS. [facility] Hematology/Oncology Clinical Pharmacist [facility] Oral [facility] [phone]  09/22/2019 11:35 AM

## 2019-09-23 ENCOUNTER — Inpatient Hospital Stay: Payer: Medicare Other | Attending: Hematology | Admitting: Hematology

## 2019-09-23 ENCOUNTER — Other Ambulatory Visit: Payer: Self-pay

## 2019-09-23 ENCOUNTER — Encounter: Payer: Self-pay | Admitting: *Deleted

## 2019-09-23 ENCOUNTER — Inpatient Hospital Stay: Payer: Medicare Other

## 2019-09-23 ENCOUNTER — Encounter: Payer: Self-pay | Admitting: Hematology

## 2019-09-23 DIAGNOSIS — Z7189 Other specified counseling: Secondary | ICD-10-CM | POA: Diagnosis not present

## 2019-09-23 DIAGNOSIS — C3411 Malignant neoplasm of upper lobe, right bronchus or lung: Secondary | ICD-10-CM | POA: Diagnosis not present

## 2019-09-23 DIAGNOSIS — D649 Anemia, unspecified: Secondary | ICD-10-CM

## 2019-09-23 DIAGNOSIS — Z79899 Other long term (current) drug therapy: Secondary | ICD-10-CM | POA: Diagnosis not present

## 2019-09-23 DIAGNOSIS — N1831 Chronic kidney disease, stage 3a: Secondary | ICD-10-CM

## 2019-09-23 DIAGNOSIS — I2693 Single subsegmental pulmonary embolism without acute cor pulmonale: Secondary | ICD-10-CM | POA: Diagnosis not present

## 2019-09-23 DIAGNOSIS — C3490 Malignant neoplasm of unspecified part of unspecified bronchus or lung: Secondary | ICD-10-CM

## 2019-09-23 DIAGNOSIS — D631 Anemia in chronic kidney disease: Secondary | ICD-10-CM | POA: Diagnosis not present

## 2019-09-23 DIAGNOSIS — Z7901 Long term (current) use of anticoagulants: Secondary | ICD-10-CM | POA: Diagnosis not present

## 2019-09-23 DIAGNOSIS — C3491 Malignant neoplasm of unspecified part of right bronchus or lung: Secondary | ICD-10-CM | POA: Diagnosis not present

## 2019-09-23 DIAGNOSIS — C7951 Secondary malignant neoplasm of bone: Secondary | ICD-10-CM | POA: Diagnosis not present

## 2019-09-23 DIAGNOSIS — N183 Chronic kidney disease, stage 3 unspecified: Secondary | ICD-10-CM | POA: Diagnosis not present

## 2019-09-23 DIAGNOSIS — I82403 Acute embolism and thrombosis of unspecified deep veins of lower extremity, bilateral: Secondary | ICD-10-CM | POA: Insufficient documentation

## 2019-09-23 LAB — CBC WITH DIFFERENTIAL (CANCER CENTER ONLY)
Abs Immature Granulocytes: 0.03 10*3/uL (ref 0.00–0.07)
Basophils Absolute: 0 10*3/uL (ref 0.0–0.1)
Basophils Relative: 1 %
Eosinophils Absolute: 0.4 10*3/uL (ref 0.0–0.5)
Eosinophils Relative: 4 %
HCT: 27.8 % — ABNORMAL LOW (ref 36.0–46.0)
Hemoglobin: 8.5 g/dL — ABNORMAL LOW (ref 12.0–15.0)
Immature Granulocytes: 0 %
Lymphocytes Relative: 21 %
Lymphs Abs: 1.8 10*3/uL (ref 0.7–4.0)
MCH: 25.5 pg — ABNORMAL LOW (ref 26.0–34.0)
MCHC: 30.6 g/dL (ref 30.0–36.0)
MCV: 83.5 fL (ref 80.0–100.0)
Monocytes Absolute: 0.3 10*3/uL (ref 0.1–1.0)
Monocytes Relative: 4 %
Neutro Abs: 6.1 10*3/uL (ref 1.7–7.7)
Neutrophils Relative %: 70 %
Platelet Count: 407 10*3/uL — ABNORMAL HIGH (ref 150–400)
RBC: 3.33 MIL/uL — ABNORMAL LOW (ref 3.87–5.11)
RDW: 17.9 % — ABNORMAL HIGH (ref 11.5–15.5)
WBC Count: 8.6 10*3/uL (ref 4.0–10.5)
nRBC: 0 % (ref 0.0–0.2)

## 2019-09-23 LAB — CMP (CANCER CENTER ONLY)
ALT: 10 U/L (ref 0–44)
AST: 12 U/L — ABNORMAL LOW (ref 15–41)
Albumin: 3.9 g/dL (ref 3.5–5.0)
Alkaline Phosphatase: 58 U/L (ref 38–126)
Anion gap: 9 (ref 5–15)
BUN: 25 mg/dL — ABNORMAL HIGH (ref 8–23)
CO2: 27 mmol/L (ref 22–32)
Calcium: 9.5 mg/dL (ref 8.9–10.3)
Chloride: 104 mmol/L (ref 98–111)
Creatinine: 1.47 mg/dL — ABNORMAL HIGH (ref 0.44–1.00)
GFR, Est AFR Am: 43 mL/min — ABNORMAL LOW (ref >60)
GFR, Estimated: 37 mL/min — ABNORMAL LOW (ref >60)
Glucose, Bld: 131 mg/dL — ABNORMAL HIGH (ref 70–99)
Potassium: 4.5 mmol/L (ref 3.5–5.1)
Sodium: 140 mmol/L (ref 135–145)
Total Bilirubin: 0.3 mg/dL (ref 0.3–1.2)
Total Protein: 7.3 g/dL (ref 6.5–8.1)

## 2019-09-23 MED ORDER — HYDROCODONE-ACETAMINOPHEN 5-325 MG PO TABS: 1.0000 | ORAL_TABLET | Freq: Three times a day (TID) | ORAL | 0 refills | Status: DC | PRN

## 2019-09-23 NOTE — Progress Notes (Addendum)
[place] OFFICE PROGRESS NOTE  Patient Care Team: Marin Olp, MD as PCP - General (Family Medicine)  HEME/ONC OVERVIEW: 1. 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 Hilar, precarinal and subcarinal adenopathy.  No intra-cranial abnormalities on MRI brain   R lung malignancy with perihilar, parachatreal LN's, and osseous mets involving L3, R iliac bone and left femoral head/neck  Bronch w/ bx of the RUL mass, brushing positive for squamous cell carcinoma  Insufficient tissue for molecular studies; peripheral blood NGS positive for EGFR exon 19 deletion -09/2019 - present: osimertinib '80mg'$  daily   2. 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  In addition, there was hypermetabolic activity in the L3 vertebral body, right iliac bone, and the left femoral head/neck, consistent with metastatic disease to the bone. -I reviewed imaging results in detail with the patient -Due to insufficient tissue from the lung biopsy for molecular studies, we obtained peripheral blood NGS, which interestingly showed exon 19 deletion, making her a candidate for EGFR targeted therapy -I discussed the pathology results in detail with the patient and her daughter -In  light of the presence of EGFR exon 19 deletion, I recommended proceeding with osimertinib '80mg'$  daily until disease progression or unacceptable toxicity -Goal of treatment is strictly palliative. -Some of the short term side-effects included, though not limited to, risk of fatigue, pancytopenia, life-threatening infections, allergic reactions, nausea, vomiting, sores in the mouth, changes in bowel habits especially diarrhea, admission to hospital for various reasons, and risks of death.  -The patient is aware that the response rates discussed earlier is not guaranteed.   -After a long discussion, patient made an informed decision to proceed.  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  -Due to the bony involvement, we discussed the benefits and risks of bone protecting agents -In light of her CKD, she will require denosumab -Patient was seen by dentistry and cleared to start Xgeva -Once she completes her IV iron infusion, we can schedule a date to start Xgeva  -I have also prescribed Norco PRN for pain   Acute LLL PTE and bilateral DVT -Currently on Eliquis; patient denies any abnormal bleeding or bruising -Goal of anticoagulation is lifelong -Continue Xarelto '20mg'$  daily   Normocytic anemia -Most likely multifactorial, hemodilution, anemia of chronic disease, and CKD -Hgb 8.5 today, lower than the last visit; iron panel suggestive of mixed iron deficiency and anemia of chronic disease  -Patient denies any symptoms of bleeding -We discussed some of the risks, benefits, and alternatives of intravenous iron infusions.  -The patient is symptomatic from anemia and the iron level is critically low; as such, oral supplement is not sufficient to  replete iron storage quickly and she will need IV iron to higher levels of iron faster for adequate hematopoiesis.  -Some of the side-effects to be  expected including risks of infusion reactions, phlebitis, headaches, nausea and fatigue.   -The patient is willing to proceed.  Plan for 2 doses of Feraheme. -Goal is to keep ferritin level greater than 50.  Stage III CKD -Cr fluctuating between 1.2 and 1.4 since 2019  -Cr 1.47 today, electrolytes normal -She has been taking periodic Aleve for hip pain -I counseled the patient on the importance of maintaining adequate hydration and avoiding nephrotoxic medication, including her NSAIDs -We will monitor closely  Borderline hypotension -BP 92/64; patient is on amlodipine -I instructed the patient to hold amlodipine and contact her PCP for further management -I also emphasized with the patient importance of maintaining adequate hydration  Goals of care discussion -I discussed with the patient and her family that in light of metastatic lung cancer, the goal of treatment is for palliative intent, not curative -However, given the presence of the EGFR exon 19 mutation, the prognosis is comparatively better than those without a driver mutation  Orders Placed This Encounter  Procedures  . CBC w/ diff    Standing Status:   Standing    Number of Occurrences:   20    Standing Expiration Date:   09/22/2020  . CMP    Standing Status:   Standing    Number of Occurrences:   20    Standing Expiration Date:   09/22/2020  . Ambulatory referral to Dentistry    Referral Priority:   Urgent    Referral Type:   Consultation    Referral Reason:   Specialty Services Required    Requested Specialty:   Dental General Practice    Number of Visits Requested:   1    All questions were answered. The patient knows to call the clinic with any problems, questions or concerns. No barriers to learning was detected.  Return in 2 weeks for labs and toxicity checks.  Brianna Men, MD 09/23/2019 1:16 PM  CHIEF COMPLAINT: "I am just sleepy"  INTERVAL HISTORY: [name] returns clinic for follow-up of metastatic  squamous cell carcinoma of the right lung.  She was accompanied by her daughter.  Multiple other family members joined the conversation via [place].  Patient reports that she has been having periodic left-sided hip pain, for which she had been participating in physical therapy for what thought to be sciatica prior to [place].  She takes tramadol periodically for the left-sided hip pain with only modest improvement.  She also has been taking periodically.  She denies any significant new back pain.  She denies any other complaint today.  REVIEW OF SYSTEMS:   Constitutional: ( - ) fevers, ( - )  chills , ( - ) night sweats Eyes: ( - ) blurriness of vision, ( - ) double vision, ( - ) watery eyes Ears, nose, mouth, throat, and face: ( - ) mucositis, ( - ) sore throat Respiratory: ( - ) cough, ( - ) dyspnea, ( - ) wheezes Cardiovascular: ( - ) palpitation, ( - ) chest discomfort, ( - ) lower extremity swelling Gastrointestinal:  ( - ) nausea, ( - ) heartburn, ( - ) change in bowel habits Skin: ( - ) abnormal skin rashes Lymphatics: ( - ) new lymphadenopathy, ( - ) easy bruising Neurological: ( - ) numbness, ( - ) tingling, ( - ) new weaknesses  Behavioral/Psych: ( - ) mood change, ( - ) new changes  All other systems were reviewed with the patient and are negative.  SUMMARY OF ONCOLOGIC HISTORY: Oncology History  Squamous cell carcinoma of lung, right ([place])  08/31/2019 Initial Diagnosis   Squamous cell carcinoma of lung, right ([place])   08/31/2019 Imaging   CT chest w/ contrast: IMPRESSION: 1. Large poorly defined partially cavitary mass in the right upper lobe measuring approximately 4.1 x 6.4 x 3.8 cm, contiguous with ill-defined mass and/or adenopathy within the mediastinum. 1.9 cm pedunculated filling defect within the right aspect of the left atrium, appears contiguous with mediastinal soft tissue density and is concerning for left atrial invasion. There is narrowing and partial  encasement of right upper lobe pulmonary arteries. 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 2. Small nonocclusive thrombus within subsegmental left lower lobe pulmonary artery. 3. 12 mm irregular right upper lobe pulmonary nodule which is either infectious, inflammatory, or neoplastic. Multifocal ill-defined consolidations in the right upper and middle lobes with diffuse ground-glass density in the right lower lobe, findings could be secondary to pneumonia, with consideration given to associated pulmonary infarcts given occluded appearance of right middle and lower lobe pulmonary arteries. 4. Numerous hypodense liver lesions, some of which are consistent with cysts. Given findings in the chest, recommend correlation with nonemergent MRI. 5. Incompletely visualized age indeterminate fracture inferior endplate T12 6. Small pericardial effusion   09/01/2019 Imaging   Doppler: Summary: Right: Findings consistent with acute deep vein thrombosis involving the right posterior tibial veins. No cystic structure found in the popliteal fossa. Left: Findings consistent with acute deep vein thrombosis involving the left popliteal vein. No cystic structure found in the popliteal fossa.   09/02/2019 Imaging   MRI brain w/ contrast:IMPRESSION: Negative for metastatic disease to the brain.  No acute abnormality.   09/02/2019 Pathology Results   FINAL MICROSCOPIC DIAGNOSIS:   A. LUNG, RIGHT UPPER LOBE, BIOPSY:  - Benign bronchial epithelium.    09/02/2019 Pathology Results   Clinical History: Transbronchial brushings, RUL  Specimen Submitted:  A. LUNG, RIGHT UPPER LOBE, BRUSHINGS:    FINAL MICROSCOPIC DIAGNOSIS:  - Malignant  cells consistent with squamous cell carcinoma    09/02/2019 Imaging   Echocardiogram: IMPRESSIONS      1. Left ventricular ejection fraction, by visual estimation, is 60 to 65%. The left ventricle has normal function. There is mildly increased left ventricular hypertrophy.  2. Left ventricular diastolic Doppler parameters are consistent with impaired relaxation pattern of LV diastolic filling.  3. Global right ventricle has mildly reduced systolic function.The right ventricular size is normal. No increase in right ventricular wall thickness.  4. Left atrial size was normal.  5. Right atrial size was normal.  6. The mitral valve is grossly normal. Trace mitral valve regurgitation.  7. The tricuspid valve is grossly normal. Tricuspid valve regurgitation was not visualized by color flow Doppler.  8. The aortic valve is tricuspid Aortic valve regurgitation was not visualized by color flow Doppler.  9. The pulmonic valve was grossly normal. Pulmonic valve regurgitation is not visualized by color flow Doppler. 10. The inferior vena cava is normal in size with <50% respiratory variability, suggesting right atrial pressure of 8 mmHg. 11. Small pericardial effusion. 12. The pericardial effusion  is RV - inferior wall.   09/02/2019 Imaging   MRI brain: IMPRESSION: Negative for metastatic disease to the brain.  No acute abnormality.    09/03/2019 Imaging   MRI abdomen: IMPRESSION: 1. Severely motion degraded MRI, substantially limiting evaluation. 2. Enhancing L3 vertebral and medial right iliac T2 hypointense bone lesions, concerning for bone metastases. PET-CT strongly recommended on a short term outpatient basis for further characterization. 3. Numerous T2 hyperintense lesions scattered throughout the liver, without definite enhancement on the severely motion degraded postcontrast sequences, presumably benign liver cysts. The liver can be better staged on PET-CT.   09/21/2019 Imaging    PET: IMPRESSION: 5.4 cm medial right upper lobe mass, corresponding to the patient's known primary bronchogenic neoplasm. Associated direct extension to the right perihilar region and left atrium.   Associated right perihilar and low right paratracheal nodal metastases.   Multifocal right lung opacities, likely reflecting a combination of tumor and postobstructive opacity/infection. Trace right pleural effusion.   Osseous metastases involving the L3 vertebral body, right iliac bone, and left femoral head/neck.   Otherwise, no evidence of metastatic disease in the abdomen/pelvis.     I have reviewed the past medical history, past surgical history, social history and family history with the patient and they are unchanged from previous note.  ALLERGIES:  has No Known Allergies.  MEDICATIONS:  Current Outpatient Medications  Medication Sig Dispense Refill  . baclofen (LIORESAL) 10 MG tablet TAKE 1 TABLET BY MOUTH THREE TIMES A DAY AS NEEDED FOR MUSCLE SPASMS (Patient taking differently: Take 10 mg by mouth 3 (three) times daily. ) 270 tablet 1  . benzonatate (TESSALON) 100 MG capsule Take 1 capsule (100 mg total) by mouth 3 (three) times daily as needed for cough. 60 capsule 2  . buPROPion (WELLBUTRIN XL) 150 MG 24 hr tablet Take 1 tablet (150 mg total) by mouth daily.    . citalopram (CELEXA) 40 MG tablet Take 1 tablet (40 mg total) by mouth daily. 90 tablet 1  . gabapentin (NEURONTIN) 300 MG capsule Take 2 capsules (600 mg total) by mouth 3 (three) times daily. 180 capsule 1  . guaiFENesin-dextromethorphan (ROBITUSSIN DM) 100-10 MG/5ML syrup Take 5 mLs by mouth every 4 (four) hours as needed for cough. 120 mL 1  . HYDROcodone-acetaminophen (NORCO) 5-325 MG tablet Take 1 tablet by mouth every 8 (eight) hours as needed for moderate pain. 30 tablet 0  . metoprolol tartrate (LOPRESSOR) 50 MG tablet Take 1 tablet (50 mg total) by mouth 2 (two) times daily. 180 tablet 1  . nicotine  (NICODERM CQ - DOSED IN MG/24 HOURS) 14 mg/24hr patch Place 1 patch (14 mg total) onto the skin daily. 28 patch 1  . osimertinib mesylate (TAGRISSO) 80 MG tablet Take 1 tablet (80 mg total) by mouth daily. 30 tablet 11  . [START ON 09/25/2019] rivaroxaban (XARELTO) 20 MG TABS tablet Take 1 tablet (20 mg total) by mouth daily with supper. Please start this after you have completed the starter pack. 30 tablet 4  . Rivaroxaban 15 & 20 MG TBPK Follow package directions: Take one 38m tablet by mouth twice a day. On day 22 (09/25/19), switch to one 241mtablet once a day. Take with food. 51 each 0  . Vitamin D, Ergocalciferol, (DRISDOL) 1.25 MG (50000 UT) CAPS capsule TAKE 1 CAPSULE (50,000 UNITS TOTAL) BY MOUTH EVERY 7 (SEVEN) DAYS. 12 capsule 0   No current facility-administered medications for this visit.     PHYSICAL EXAMINATION: ECOG  PERFORMANCE STATUS: 2 - Symptomatic, <50% confined to bed  There were no vitals filed for this visit. There is no height or weight on file to calculate BMI.  There were no vitals filed for this visit.  GENERAL: alert, no distress and comfortable SKIN: skin color, texture, turgor are normal, no rashes or significant lesions EYES: conjunctiva are pink and non-injected, sclera clear OROPHARYNX: no exudate, no erythema; lips, buccal mucosa, and tongue normal  NECK: supple, non-tender LUNGS: clear to auscultation with normal breathing effort HEART: regular rate & rhythm and no murmurs and no lower extremity edema ABDOMEN: soft, non-tender, non-distended, normal bowel sounds Musculoskeletal: no cyanosis of digits and no clubbing  PSYCH: alert & oriented x 3, slowed speech  LABORATORY DATA:  I have reviewed the data as listed    Component Value Date/Time   NA 140 09/23/2019 1021   K 4.5 09/23/2019 1021   CL 104 09/23/2019 1021   CO2 27 09/23/2019 1021   GLUCOSE 131 (H) 09/23/2019 1021   BUN 25 (H) 09/23/2019 1021   CREATININE 1.47 (H) 09/23/2019 1021    CALCIUM 9.5 09/23/2019 1021   PROT 7.3 09/23/2019 1021   ALBUMIN 3.9 09/23/2019 1021   AST 12 (L) 09/23/2019 1021   ALT 10 09/23/2019 1021   ALKPHOS 58 09/23/2019 1021   BILITOT 0.3 09/23/2019 1021   GFRNONAA 37 (L) 09/23/2019 1021   GFRAA 43 (L) 09/23/2019 1021    No results found for: SPEP, UPEP  Lab Results  Component Value Date   WBC 8.6 09/23/2019   NEUTROABS 6.1 09/23/2019   HGB 8.5 (L) 09/23/2019   HCT 27.8 (L) 09/23/2019   MCV 83.5 09/23/2019   PLT 407 (H) 09/23/2019      Chemistry      Component Value Date/Time   NA 140 09/23/2019 1021   K 4.5 09/23/2019 1021   CL 104 09/23/2019 1021   CO2 27 09/23/2019 1021   BUN 25 (H) 09/23/2019 1021   CREATININE 1.47 (H) 09/23/2019 1021      Component Value Date/Time   CALCIUM 9.5 09/23/2019 1021   ALKPHOS 58 09/23/2019 1021   AST 12 (L) 09/23/2019 1021   ALT 10 09/23/2019 1021   BILITOT 0.3 09/23/2019 1021       RADIOGRAPHIC STUDIES: I have personally reviewed the radiological images as listed below and agreed with the findings in the report. Dg Chest 2 View  Result Date: 08/31/2019 CLINICAL DATA:  Chest pain, shortness of breath. EXAM: CHEST - 2 VIEW COMPARISON:  None. FINDINGS: The heart size and mediastinal contours are within normal limits. No pneumothorax or pleural effusion is noted. Left lung is clear. Right midlung and lower lobe opacities are noted concerning for atelectasis or possibly infiltrates. There is noted right perihilar rounded opacity concerning for possible neoplasm. The visualized skeletal structures are unremarkable. IMPRESSION: Right perihilar rounded opacity is noted concerning for possible neoplasm or malignancy. More peripheral opacities are noted concerning for inflammation or possible atelectasis. CT scan of the chest with intravenous contrast is recommended for further evaluation. Electronically Signed   By: Marijo Conception M.D.   On: 08/31/2019 16:55   Ct Chest W Contrast  Result Date:  08/31/2019 CLINICAL DATA:  Abnormal chest x-ray shortness of breath EXAM: CT CHEST WITH CONTRAST TECHNIQUE: Multidetector CT imaging of the chest was performed during intravenous contrast administration. CONTRAST:  81m OMNIPAQUE IOHEXOL 300 MG/ML  SOLN COMPARISON:  Chest x-ray 08/31/2019 FINDINGS: Cardiovascular: Nonaneurysmal aorta. Moderate aortic atherosclerosis. No  dissection seen. Slightly enlarged pulmonary trunk up to 3.5 cm. Borderline cardiomegaly. Small pericardial effusion measuring up to 14 mm in thickness. 19 mm lobulated filling defect within the right side of the left atrium. Appears contiguous with soft tissue mass in the mediastinum. Coronary vascular calcification. Suspected small nonocclusive filling defect within subsegmental left lower lobe pulmonary artery, series 2, image number 103 and 104. Abduct truncation of the descending right pulmonary artery with essentially no significant enhancement of right middle or lower lobe pulmonary vessels. Narrowing of right upper lobe pulmonary arterial vessels by mediastinal soft tissue mass. Mediastinum/Nodes: Midline trachea. Subcentimeter hypodense thyroid nodules. Coarse calcifications in the thyroid. Esophagus within normal limits. Enlarged precarinal node measuring 16 mm. Subcarinal node measuring 14 mm. Ill-defined soft tissue density within the right hilus, encasing right upper lobe pulmonary vessels. Lungs/Pleura: Mild emphysema. Partially cavitary mass within the right upper lobe measuring approximately 4.1 cm x 6.4 cm by 3.8 cm. This is contiguous with ill-defined soft tissue mass/adenopathy within the mediastinum 12 mm irregular nodule in the right upper lobe, series 5, image number 49. Ill-defined consolidations within the right upper lobe and the right middle lobe. Diffuse ground-glass density in the right lower lobe with small consolidations. No pleural effusion or pneumothorax. Upper Abdomen: Numerous hypodense liver lesions, some of which  are consistent with cysts. Atrophic partially visualized right kidney. Musculoskeletal: Incompletely visualized age indeterminate fracture at the anterior inferior endplate T12 IMPRESSION: 1. Large poorly defined partially cavitary mass in the right upper lobe measuring approximately 4.1 x 6.4 x 3.8 cm, contiguous with ill-defined mass and/or adenopathy within the mediastinum. 1.9 cm pedunculated filling defect within the right aspect of the left atrium, appears contiguous with mediastinal soft tissue density and is concerning for left atrial invasion. There is narrowing and partial encasement of right upper lobe pulmonary arteries. 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 2. Small nonocclusive thrombus within subsegmental left lower lobe pulmonary artery. 3. 12 mm irregular right upper lobe pulmonary nodule which is either infectious, inflammatory, or neoplastic. Multifocal ill-defined consolidations in the right upper and middle lobes with diffuse ground-glass density in the right lower lobe, findings could be secondary to pneumonia, with consideration given to associated pulmonary infarcts given occluded appearance of right middle and lower lobe pulmonary arteries. 4. Numerous hypodense liver lesions, some of which are consistent with cysts. Given findings in the chest, recommend correlation with nonemergent MRI. 5. Incompletely visualized age indeterminate fracture inferior endplate T12 6. Small pericardial effusion Critical Value/emergent results were called by telephone at the time of interpretation on 08/31/2019 at 7:54 pm to [facility] , who verbally acknowledged these results. Aortic Atherosclerosis (ICD10-I70.0) and Emphysema  (ICD10-J43.9). Electronically Signed   By: Donavan Foil M.D.   On: 08/31/2019 19:54   Mr Jeri Cos JX Contrast  Result Date: 09/02/2019 CLINICAL DATA:  Small cell lung cancer staging EXAM: MRI HEAD WITHOUT AND WITH CONTRAST TECHNIQUE: Multiplanar, multiecho pulse sequences of the brain and surrounding structures were obtained without and with intravenous contrast. CONTRAST:  28m GADAVIST GADOBUTROL 1 MMOL/ML IV SOLN COMPARISON:  None. FINDINGS: Brain: No acute infarction, hemorrhage, hydrocephalus, extra-axial collection or mass lesion. Few small white matter hyperintensities bilaterally likely due to chronic ischemia. No enhancing metastatic deposits in the brain. Leptomeningeal enhancement  normal. Vascular: Normal arterial flow voids. 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 Skull and upper cervical spine: Negative Sinuses/Orbits: Negative Other: 6 x 18 mm cyst right temporal subcutaneous tissues. No enhancement. Probable benign dermal cyst. IMPRESSION: Negative for metastatic disease to the brain.  No acute abnormality. Electronically Signed   By: Franchot Gallo M.D.   On: 09/02/2019 21:43   Mr Abdomen W DG Contrast  Result Date: 09/03/2019 CLINICAL DATA:  Inpatient. New bronchoscopic diagnosis of right upper lobe squamous cell lung cancer. Indeterminate liver lesions on recent chest CT. EXAM: MRI ABDOMEN WITHOUT AND WITH CONTRAST TECHNIQUE: Multiplanar multisequence MR imaging of the abdomen was performed both before and after the administration of intravenous contrast. CONTRAST:  58m GADAVIST GADOBUTROL 1 MMOL/ML IV SOLN COMPARISON:  08/31/2019 chest CT. FINDINGS: Images are severely motion degraded, significantly limiting assessment. Lower chest: Hazy patchy right lung base opacity, correlating with patchy ground-glass opacity and consolidation at the right lung base on recent chest CT. Hepatobiliary: Normal liver size and configuration. No  hepatic steatosis. Numerous homogeneous circumscribed T2 hyperintense lesions scattered throughout the liver, without definite enhancement on the severely motion degraded postcontrast sequences. Gallbladder appears contracted and is poorly evaluated due to motion degradation. No biliary ductal dilatation. Common bile duct diameter 3 mm. No evidence of choledocholithiasis. Pancreas: No pancreatic mass or duct dilation.  No pancreas divisum. Spleen: Normal size. No mass. Adrenals/Urinary Tract: No discrete adrenal lesions. Asymmetric moderate right renal atrophy. No hydronephrosis. No suspicious renal masses. Subcentimeter simple renal cyst in the anterior interpolar left kidney. Stomach/Bowel: Normal non-distended stomach. Visualized small and large bowel is normal caliber, with no bowel wall thickening. Vascular/Lymphatic: Atherosclerotic nonaneurysmal abdominal aorta. Patent portal, splenic, hepatic and renal veins. No pathologically enlarged lymph nodes in the abdomen. Other: No abdominal ascites or focal fluid collection. Musculoskeletal: There is a small enhancing 1.1 cm T2 hypointense inferior L3 vertebral lesion (series 6/image 18). There is a similar enhancing 1.7 cm T2 hypointense medial right iliac bone lesion (series 6/image 11). IMPRESSION: 1. Severely motion degraded MRI, substantially limiting evaluation. 2. Enhancing L3 vertebral and medial right iliac T2 hypointense bone lesions, concerning for bone metastases. PET-CT strongly recommended on a short term outpatient basis for further characterization. 3. Numerous T2 hyperintense lesions scattered throughout the liver, without definite enhancement on the severely motion degraded postcontrast sequences, presumably benign liver cysts. The liver can be better staged on PET-CT. Electronically Signed   By: JIlona SorrelM.D.   On: 09/03/2019 20:06   Nm Pet Image Initial (pi) Skull Base To Thigh  Result Date: 09/21/2019 CLINICAL DATA:  Initial treatment  strategy for non-small cell lung cancer. EXAM: NUCLEAR MEDICINE PET SKULL BASE TO THIGH TECHNIQUE: 7.7 mCi F-18 FDG was injected intravenously. Full-ring PET imaging was performed from the skull base to thigh after the radiotracer. CT data was obtained and used for attenuation correction and anatomic localization. Fasting blood glucose: 92 mg/dl COMPARISON:  MRI abdomen dated 09/03/2019. CT chest dated 08/31/2019. FINDINGS: Mediastinal blood pool activity: SUV max 2.8 Liver activity: SUV max NA NECK: No hypermetabolic cervical lymphadenopathy. 12 mm right thyroid nodule (series 4/image 46), max SUV 4.1. This is of questionable clinical significance given the additional findings. Incidental CT findings: none CHEST: 4.2 x 5.4 cm medial right upper lobe mass extending to the right perihilar region (series 8/image 36), likely reflecting a combination of neoplasm and postobstructive opacity, max SUV 15.4. 1.7 cm short axis right perihilar node, max  SUV 13.2. Additional 9 mm short axis low right paratracheal node with hypermetabolism (series 4/image 64). Suspected direct extension of tumor into the left atrium also demonstrates hypermetabolism on PET (PET image 70). Additional patchy/nodular opacities in the right upper lobe (series 8/image 23) with max SUV 5.8, lateral right middle lobe (series 8/image 40) with max SUV 13.2, and posterior right lower lobe (series 8/image 85) with max SUV 5.7. This may reflect a combination of tumor and postobstructive opacity/infection. Trace right pleural effusion.  Left lung is clear. Incidental CT findings: Trace pericardial effusion. Atherosclerotic calcifications of the aortic arch. Mild coronary atherosclerosis the LAD and right coronary artery. ABDOMEN/PELVIS: No abnormal hypermetabolism in the liver, spleen, pancreas, or adrenal glands. Scattered hepatic cysts. No hypermetabolic lymphadenopathy in the abdomen/pelvis. Incidental CT findings: Right renal atrophy. Atherosclerotic  calcifications abdominal aorta and branch vessels. Status post hysterectomy. SKELETON: Focal hypermetabolism in the L3 vertebral body (PET image 124), max SUV 7.9. Focal hypermetabolism in the right iliac bone, with associated mild sclerosis (series 4/image 152), max SUV 7.3. Mild focal hypermetabolism at the junction of the left femoral head/neck (PET image 172), max SUV 4.3. Incidental CT findings: Degenerative changes of the visualized thoracolumbar spine. IMPRESSION: 5.4 cm medial right upper lobe mass, corresponding to the patient's known primary bronchogenic neoplasm. Associated direct extension to the right perihilar region and left atrium. Associated right perihilar and low right paratracheal nodal metastases. Multifocal right lung opacities, likely reflecting a combination of tumor and postobstructive opacity/infection. Trace right pleural effusion. Osseous metastases involving the L3 vertebral body, right iliac bone, and left femoral head/neck. Otherwise, no evidence of metastatic disease in the abdomen/pelvis. Electronically Signed   By: Julian Hy M.D.   On: 09/21/2019 11:30   Dg Chest Port 1 View  Result Date: 09/02/2019 CLINICAL DATA:  Status post bronchoscopy EXAM: PORTABLE CHEST 1 VIEW COMPARISON:  08/31/2019 FINDINGS: Cardiac shadow is stable. Persistent mass lesion in the right upper lobe is noted adjacent to the right hilum with hilar adenopathy identified. No post bronchoscopy pneumothorax is seen. No other focal abnormality is seen. IMPRESSION: Stable appearing right lung mass with hilar adenopathy. No post bronchoscopy pneumothorax is seen. Electronically Signed   By: [name] Catalina M.D.   On: 09/02/2019 08:40   Vas [place] Lower Extremity Venous (dvt)  Result Date: 09/02/2019  Lower Venous Study Indications: Pulmonary embolism.  Risk Factors: None identified. Limitations: Poor ultrasound/tissue interface and patient positioning, patient movement. Comparison Study: No prior studies.  Performing Technologist: Oliver Hum RVT  Examination Guidelines: A complete evaluation includes B-mode imaging, spectral Doppler, color Doppler, and power Doppler as needed of all accessible portions of each vessel. Bilateral testing is considered an integral part of a complete examination. Limited examinations for reoccurring indications may be performed as noted.  +---------+---------------+---------+-----------+----------+--------------+ RIGHT    CompressibilityPhasicitySpontaneityPropertiesThrombus Aging +---------+---------------+---------+-----------+----------+--------------+ CFV      Full           Yes      Yes                                 +---------+---------------+---------+-----------+----------+--------------+ SFJ      Full                                                        +---------+---------------+---------+-----------+----------+--------------+  FV Prox  Full                                                        +---------+---------------+---------+-----------+----------+--------------+ FV Mid   Full                                                        +---------+---------------+---------+-----------+----------+--------------+ FV DistalFull                                                        +---------+---------------+---------+-----------+----------+--------------+ PFV      Full                                                        +---------+---------------+---------+-----------+----------+--------------+ POP      Full           Yes      Yes                                 +---------+---------------+---------+-----------+----------+--------------+ PTV      Partial                                      Acute          +---------+---------------+---------+-----------+----------+--------------+ PERO     Full                                                         +---------+---------------+---------+-----------+----------+--------------+   +---------+---------------+---------+-----------+----------+--------------+ LEFT     CompressibilityPhasicitySpontaneityPropertiesThrombus Aging +---------+---------------+---------+-----------+----------+--------------+ CFV      Full           Yes      Yes                                 +---------+---------------+---------+-----------+----------+--------------+ SFJ      Full                                                        +---------+---------------+---------+-----------+----------+--------------+ FV Prox  Full                                                        +---------+---------------+---------+-----------+----------+--------------+  FV Mid   Full                                                        +---------+---------------+---------+-----------+----------+--------------+ FV DistalFull                                                        +---------+---------------+---------+-----------+----------+--------------+ PFV      Full                                                        +---------+---------------+---------+-----------+----------+--------------+ POP      Partial        Yes      Yes                  Acute          +---------+---------------+---------+-----------+----------+--------------+ PTV      Full                                                        +---------+---------------+---------+-----------+----------+--------------+ PERO     Full                                                        +---------+---------------+---------+-----------+----------+--------------+ Gastroc  Full                                                        +---------+---------------+---------+-----------+----------+--------------+ SSV      Full                                                         +---------+---------------+---------+-----------+----------+--------------+     Summary: Right: Findings consistent with acute deep vein thrombosis involving the right posterior tibial veins. No cystic structure found in the popliteal fossa. Left: Findings consistent with acute deep vein thrombosis involving the left popliteal vein. No cystic structure found in the popliteal fossa.  *See table(s) above for measurements and observations. Electronically signed by Servando Snare MD on 09/02/2019 at 2:17:01 PM.    Final    Dg C-arm Bronchoscopy  Result Date: 09/02/2019 C-ARM BRONCHOSCOPY: Fluoroscopy was utilized by the requesting physician.  No radiographic interpretation.

## 2019-09-23 NOTE — Progress Notes (Signed)
Patient here for follow up appointment. Met with her and her daughter as I didn't meet her at her new patient appointment. Formally introduced myself. Patient work up complete. Will start oral chemotherapy. Appointment for port placement cancelled and order discontinued. Gave patient and the daughter business cards for [facility] at our outpatient pharmacy to help coordinate delivery of new medication.    

## 2019-09-23 NOTE — Telephone Encounter (Signed)
Oral Chemotherapy Pharmacist Encounter  Patient Education I spoke with patient and her daughter Katrina for overview of new oral chemotherapy medication: Tagrisso (osimertinib) for the treatment of EGFR positive NSCLC, planned duration until disease progression or unacceptable drug toxicity.   Counseled family on administration, dosing, side effects, monitoring, drug-food interactions, safe handling, storage, and disposal. Patient will take 1 tablet (80 mg total) by mouth daily.  Side effects include but not limited to: rash, diarrhea, decrease wbc/plt/hgb.    Reviewed with patient importance of keeping a medication schedule and plan for any missed doses.  The family voiced understanding and appreciation. All questions answered. Medication handout placed in the mail.  Medication will be delivered from [facility] on 09/24/2019.  Provided patient with Oral [facility] phone number. Patient knows to call the office with questions or concerns. Oral Chemotherapy [facility] will continue to follow.  Darl Pikes, PharmD, BCPS, [facility] Hematology/Oncology Clinical Pharmacist [facility] [phone]  09/23/2019 2:35 PM

## 2019-09-23 NOTE — Telephone Encounter (Signed)
Oral Chemotherapy Pharmacist Encounter   Reviewed and agree with pharmacy student Ellen's osimertinib assessment.    Darl Pikes, PharmD, BCPS, [facility] Hematology/Oncology Clinical Pharmacist [facility] [phone]  09/23/2019 2:29 PM

## 2019-09-26 ENCOUNTER — Encounter: Payer: Self-pay | Admitting: *Deleted

## 2019-09-27 ENCOUNTER — Telehealth: Payer: Self-pay | Admitting: *Deleted

## 2019-09-27 ENCOUNTER — Encounter: Payer: Self-pay | Admitting: *Deleted

## 2019-09-27 NOTE — Telephone Encounter (Signed)
Spoke with patient. Per dr zhao, runny nose, blood tinged is not a symptom of her cancer. Instructed her to keep nares moist with vaseline using a q-tip.patient verbalized understanding.

## 2019-09-27 NOTE — Telephone Encounter (Signed)
Per dr Maylon Peppers, patient is NOT to cancel her port appt, patient verbalized understanding.

## 2019-09-28 ENCOUNTER — Other Ambulatory Visit: Payer: Self-pay

## 2019-09-28 ENCOUNTER — Encounter: Payer: Self-pay | Admitting: *Deleted

## 2019-09-28 ENCOUNTER — Encounter (HOSPITAL_COMMUNITY): Payer: Self-pay | Admitting: Dentistry

## 2019-09-28 ENCOUNTER — Ambulatory Visit (HOSPITAL_COMMUNITY): Payer: Self-pay | Admitting: Dentistry

## 2019-09-28 VITALS — BP 143/72 | HR 108 | Temp 99.9°F

## 2019-09-28 DIAGNOSIS — K0530 Chronic periodontitis, unspecified: Secondary | ICD-10-CM

## 2019-09-28 DIAGNOSIS — K0889 Other specified disorders of teeth and supporting structures: Secondary | ICD-10-CM

## 2019-09-28 DIAGNOSIS — K06010 Localized gingival recession, unspecified: Secondary | ICD-10-CM

## 2019-09-28 DIAGNOSIS — K08409 Partial loss of teeth, unspecified cause, unspecified class: Secondary | ICD-10-CM

## 2019-09-28 DIAGNOSIS — K036 Deposits [accretions] on teeth: Secondary | ICD-10-CM

## 2019-09-28 DIAGNOSIS — C3491 Malignant neoplasm of unspecified part of right bronchus or lung: Secondary | ICD-10-CM

## 2019-09-28 DIAGNOSIS — Z01818 Encounter for other preprocedural examination: Secondary | ICD-10-CM

## 2019-09-28 DIAGNOSIS — K030 Excessive attrition of teeth: Secondary | ICD-10-CM

## 2019-09-28 DIAGNOSIS — C7951 Secondary malignant neoplasm of bone: Secondary | ICD-10-CM

## 2019-09-28 DIAGNOSIS — M2632 Excessive spacing of fully erupted teeth: Secondary | ICD-10-CM

## 2019-09-28 DIAGNOSIS — M278 Other specified diseases of jaws: Secondary | ICD-10-CM

## 2019-09-28 DIAGNOSIS — M264 Malocclusion, unspecified: Secondary | ICD-10-CM

## 2019-09-28 MED ORDER — CHLORHEXIDINE GLUCONATE 0.12 % MT SOLN: OROMUCOSAL | 99 refills | Status: DC

## 2019-09-28 NOTE — Progress Notes (Signed)
DENTAL CONSULTATION  Date of Consultation:  09/28/2019 Patient Name:   Brianna Saunders Date of Birth:   [birth year] Medical Record Number: [id number]  COVID 19 SCREENING: The patient does not symptoms concerning for COVID-19 infection (Including fever, chills, cough, or new SHORTNESS OF BREATH).    VITALS: BP (!) 143/72 (BP Location: Right Arm)   Pulse (!) 108   Temp 99.9 F (37.7 C)   CHIEF COMPLAINT: Patient referred by Dr. Maylon Peppers for a dental consultation.  HPI: Brianna Saunders is a 63 year old female recently diagnosed with squamous cell carcinoma of the right lung.  Patient also has bone metastases to the L3 vertebral body, right iliac bone, and left femoral head.  Patient with anticipated use of Xgeva therapy.  Patient is now seen as part of a medically necessary pre-Xgeva therapy dental protocol examination.  The patient currently denies any acute toothaches, swellings, or abscesses.  Patient does have some tooth sensitivity with cold and is using a toothpaste for sensitive teeth at this time.  Patient has not seen a dentist in over 20 years by report.  Patient had her wisdom teeth extracted at that time with no complications.  Patient denies having partial dentures.  Patient denies having dental phobia.  PROBLEM LIST: Patient Active Problem List   Diagnosis Date Noted  . Squamous cell carcinoma of lung, right ([place]) 08/31/2019    Priority: High  . Goals of care, counseling/discussion 09/23/2019  . Pulmonary embolism ([place]) 08/31/2019  . Normocytic anemia 08/31/2019  . CKD (chronic kidney disease), stage III 08/31/2019  . Liver lesion 08/31/2019  . Left foot drop 07/09/2019  . Vitamin D deficiency 04/08/2019  . Cervical spinal stenosis 08/27/2018  . Difficulty walking 01/28/2018  . Current smoker 12/18/2017  . Hypertension 11/27/2017  . Chronic pain 11/27/2017  . Major depression in full remission ([place])     PMH: Past Medical History:  Diagnosis Date  .  Chicken pox   . Hypertension     PSH: Past Surgical History:  Procedure Laterality Date  . FRACTURE SURGERY    . PARTIAL HYSTERECTOMY    . SPINE SURGERY     lumbar spine- around 2014  . upper back surgery     states plate in neck and into right shoulder blade reportedly- around 2014  . VIDEO BRONCHOSCOPY Bilateral 09/02/2019   Procedure: VIDEO BRONCHOSCOPY WITH FLUORO;  Surgeon: Collene Gobble, MD;  Location: [facility];  Service: Cardiopulmonary;  Laterality: Bilateral;    ALLERGIES: No Known Allergies  MEDICATIONS: Current Outpatient Medications  Medication Sig Dispense Refill  . baclofen (LIORESAL) 10 MG tablet TAKE 1 TABLET BY MOUTH THREE TIMES A DAY AS NEEDED FOR MUSCLE SPASMS (Patient taking differently: Take 10 mg by mouth 3 (three) times daily. ) 270 tablet 1  . benzonatate (TESSALON) 100 MG capsule Take 1 capsule (100 mg total) by mouth 3 (three) times daily as needed for cough. 60 capsule 2  . buPROPion (WELLBUTRIN XL) 150 MG 24 hr tablet Take 1 tablet (150 mg total) by mouth daily.    . citalopram (CELEXA) 40 MG tablet Take 1 tablet (40 mg total) by mouth daily. 90 tablet 1  . gabapentin (NEURONTIN) 300 MG capsule Take 2 capsules (600 mg total) by mouth 3 (three) times daily. 180 capsule 1  . guaiFENesin-dextromethorphan (ROBITUSSIN DM) 100-10 MG/5ML syrup Take 5 mLs by mouth every 4 (four) hours as needed for cough. 120 mL 1  . HYDROcodone-acetaminophen (NORCO) 5-325 MG tablet Take 1 tablet by  mouth every 8 (eight) hours as needed for moderate pain. 30 tablet 0  . metoprolol tartrate (LOPRESSOR) 50 MG tablet Take 1 tablet (50 mg total) by mouth 2 (two) times daily. 180 tablet 1  . nicotine (NICODERM CQ - DOSED IN MG/24 HOURS) 14 mg/24hr patch Place 1 patch (14 mg total) onto the skin daily. 28 patch 1  . osimertinib mesylate (TAGRISSO) 80 MG tablet Take 1 tablet (80 mg total) by mouth daily. 30 tablet 11  . rivaroxaban (XARELTO) 20 MG TABS tablet Take 1 tablet (20 mg  total) by mouth daily with supper. Please start this after you have completed the starter pack. 30 tablet 4  . Vitamin D, Ergocalciferol, (DRISDOL) 1.25 MG (50000 UT) CAPS capsule TAKE 1 CAPSULE (50,000 UNITS TOTAL) BY MOUTH EVERY 7 (SEVEN) DAYS. 12 capsule 0   No current facility-administered medications for this visit.     LABS: Lab Results  Component Value Date   WBC 8.6 09/23/2019   HGB 8.5 (L) 09/23/2019   HCT 27.8 (L) 09/23/2019   MCV 83.5 09/23/2019   PLT 407 (H) 09/23/2019      Component Value Date/Time   NA 140 09/23/2019 1021   K 4.5 09/23/2019 1021   CL 104 09/23/2019 1021   CO2 27 09/23/2019 1021   GLUCOSE 131 (H) 09/23/2019 1021   BUN 25 (H) 09/23/2019 1021   CREATININE 1.47 (H) 09/23/2019 1021   CALCIUM 9.5 09/23/2019 1021   GFRNONAA 37 (L) 09/23/2019 1021   GFRAA 43 (L) 09/23/2019 1021   Lab Results  Component Value Date   INR 1.1 08/31/2019   No results found for: PTT  SOCIAL HISTORY: Social History   Socioeconomic History  . Marital status: Divorced    Spouse name: Not on file  . Number of children: 5  . Years of education: Not on file  . Highest education level: Not on file  Occupational History  . Not on file  Social Needs  . Financial resource strain: Not on file  . Food insecurity    Worry: Not on file    Inability: Not on file  . Transportation needs    Medical: Not on file    Non-medical: Not on file  Tobacco Use  . Smoking status: Current Every Day Smoker    Packs/day: 0.50    Years: 45.00    Pack years: 22.50    Types: Cigarettes    Start date: 11/11/1969  . Smokeless tobacco: Never Used  . Tobacco comment: 2 cigarettes a day  Substance and Sexual Activity  . Alcohol use: No    Frequency: Never  . Drug use: No  . Sexual activity: Never  Lifestyle  . Physical activity    Days per week: Not on file    Minutes per session: Not on file  . Stress: Not on file  Relationships  . Social Herbalist on phone: Not on file     Gets together: Not on file    Attends religious service: Not on file    Active member of club or organization: Not on file    Attends meetings of clubs or organizations: Not on file    Relationship status: Not on file  . Intimate partner violence    Fear of current or ex partner: Not on file    Emotionally abused: Not on file    Physically abused: Not on file    Forced sexual activity: Not on file  Other Topics Concern  .  Not on file  Social History Narrative   Lives alone. Also has 1 cat.    Moved to [place] from [place]- slightly closer to family.    Does cleaning/cooking      Disabled due to worsening back/surgeries. 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 Wear and tear from job.    12th grade education      Hobbies: solitaire on laptop, enjoys doing puzzles on the computer    FAMILY HISTORY: Family History  Problem Relation Age of Onset  . Hypertension Mother   . Heart attack Mother        4  . Hypertension Father   . Kidney disease Father        dialysis  . Kidney disease Brother        dialysis  . Heart attack Maternal Grandmother   . Early death Sister        died at 38 months    REVIEW OF SYSTEMS: Reviewed with the patient as per History of present illness. Psych: Patient denies having dental phobia.  DENTAL HISTORY: CHIEF COMPLAINT: Patient referred by Dr. Maylon Peppers for a dental consultation.  HPI: Brianna Saunders is a 63 year old female recently diagnosed with squamous cell carcinoma of the right lung.  Patient also has bone metastases to the L3 vertebral body, right iliac bone, and left femoral head.  Patient with anticipated use of Xgeva therapy.  Patient is now seen as part of a medically necessary pre-Xgeva therapy dental protocol examination.  The patient currently denies any acute toothaches, swellings, or abscesses.  Patient does have some tooth sensitivity with cold and is using a toothpaste for sensitive teeth at this time.   Patient has not seen a dentist in over 20 years by report.  Patient had her wisdom teeth extracted at that time with no complications.  Patient denies having partial dentures.  Patient denies having dental phobia.  DENTAL EXAMINATION: GENERAL: The patient is a well-developed, well-nourished female no acute distress. HEAD AND NECK: There is no palpable neck lymphadenopathy.  The patient denies acute TMJ symptoms. INTRAORAL EXAM: The patient has normal saliva.  The patient has bilateral maxillary palatal exostosis in the area of tooth numbers 1 through 3 and 14 through 16. DENTITION: Patient is missing tooth numbers 1, 2, 16, 17, and 32.  Patient has multiple diastemas.  The patient has evidence of maxillary and mandibular incisal attrition. PERIODONTAL: The patient has chronic periodontitis with plaque and calculus accumulations, gingival recession, and incipient mandibular anterior tooth mobility.  Patient has incipient a moderate bone loss noted.  Radiographic calculus is noted. DENTAL CARIES/SUBOPTIMAL RESTORATIONS: No obvious dental caries are noted. ENDODONTIC: The patient currently denies acute pulpitis symptoms.  I do not see any evidence of periapical pathology or radiolucency. CROWN AND BRIDGE: There are no crown or bridge restorations. PROSTHODONTIC: Patient denies having partial dentures. OCCLUSION: Patient has a poor occlusal scheme secondary to multiple missing teeth, multiple diastemas, and excessive incisal attrition.  RADIOGRAPHIC INTERPRETATION: An orthopantogram was taken and supplemented with a full series of dental radiographs.  Periapical radiographs are suboptimal secondary to lack of patient cooperation and significant palatal exostoses. Patient is missing tooth numbers 1, 2, 16, 17, and 32.  Multiple diastemas are noted. There is incipient a moderate bone loss. There are radiopacities consistent with bilateral maxillary palatal exostoses.  Radiographic calculus is  noted.   ASSESSMENTS: 1.  Squamous cell carcinoma of the right lung 2.  Bone metastases 3.  Pre-Xgeva therapy dental protocol  4.  Chronic periodontitis with bone loss 5.  Gingival recession 6.  Accretions 7.  Mandibular anterior tooth mobility 8.  Multiple missing teeth 9.  Multiple diastemas 10.  Maxillary and mandibular incisal attrition 11.  Poor occlusal scheme and malocclusion 12.  Risk for bleeding with invasive dental procedures while on Xarelto therapy 13.  Risk for infection and bleeding while on current immunotherapy.   PLAN/RECOMMENDATIONS: 1. I discussed the risks, benefits, and complications of various treatment options with the patient in relationship to her medical and dental conditions, current immunotherapy, anticipated Xgeva therapy, current Xarelto therapy,  risk for infection and bleeding, and risk for osteonecrosis of the jaw related to the [facility] therapy. We discussed various treatment options to include no treatment, multiple extractions with alveoloplasty, pre-prosthetic surgery as indicated, periodontal therapy, dental restorations, root canal therapy, crown and bridge therapy, implant therapy, and replacement of missing teeth as indicated.  We also discussed referral to a periodontist, new primary dentist, and oral surgeon as indicated.  The patient currently wishes to defer any dental treatment at this time.  The patient will consider following up with a primary dentist of her choice for initial periodontal therapy in early to mid 2021.  This is taking into account the current COVID-19 precautions and potential for infection and bleeding while on the Xarelto therapy and while on the immunotherapy.  The patient, therefore, is cleared for start of Xgeva therapy at this time.  The patient is aware of the need to avoid future dental extractions due to the potential risk for osteonecrosis of the jaw with invasive dental procedures.  Dr. Maylon Peppers has been contacted and is aware of  the current plan of care.   2. Discussion of findings with medical team and coordination of future medical and dental care as needed.  I spent in excess of  120 minutes during the conduct of this consultation and >50% of this time involved direct face-to-face encounter for counseling and/or coordination of the patient's care.    Lenn Cal, DDS

## 2019-09-28 NOTE — Progress Notes (Signed)
Patient's daughter Adonis Huguenin called for clarification on port placement. She believed this to be cancelled, but the patient thinks she still needs it done. Confirmed with Katrina that this procedure has been cancelled at this time since patient will be started on oral treatment.  Katrina, also wanted to check to see if patient could take Unisom to help her sleep. Spoke with Dr Maylon Peppers, and he is fine with patient taking Unisom for sleep.   Also confirmed with Katrina that patient has been scheduled for a nutrition consult before her next appointment here in this office.

## 2019-09-28 NOTE — Patient Instructions (Addendum)
COVID-19 Education: The signs and symptoms of COVID-19 were discussed with the patient and how to seek care for testing (follow up with PCP or arrange E-visit).   The importance of social distancing was discussed today. The patient is to pick up her prescription for chlorhexidine rinse at [facility].  Patient is to rinse with 15 mL twice daily after breakfast and at bedtime.  Patient is using a swish and spit manner.  Dr. Enrique Sack

## 2019-09-29 ENCOUNTER — Telehealth: Payer: Self-pay

## 2019-09-29 ENCOUNTER — Encounter: Payer: Self-pay | Admitting: Acute Care

## 2019-09-29 ENCOUNTER — Ambulatory Visit (INDEPENDENT_AMBULATORY_CARE_PROVIDER_SITE_OTHER): Payer: Medicare Other | Admitting: Acute Care

## 2019-09-29 VITALS — BP 110/68 | HR 100 | Temp 97.1°F | Ht 64.0 in | Wt 158.0 lb

## 2019-09-29 DIAGNOSIS — F17200 Nicotine dependence, unspecified, uncomplicated: Secondary | ICD-10-CM

## 2019-09-29 DIAGNOSIS — C3491 Malignant neoplasm of unspecified part of right bronchus or lung: Secondary | ICD-10-CM | POA: Diagnosis not present

## 2019-09-29 DIAGNOSIS — I2693 Single subsegmental pulmonary embolism without acute cor pulmonale: Secondary | ICD-10-CM

## 2019-09-29 MED ORDER — ALBUTEROL SULFATE HFA 108 (90 BASE) MCG/ACT IN AERS: 2.0000 | INHALATION_SPRAY | Freq: Four times a day (QID) | RESPIRATORY_TRACT | 2 refills | Status: DC | PRN

## 2019-09-29 NOTE — Progress Notes (Signed)
History of Present Illness Brianna Saunders is a 63 y.o. 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 She is followed by Dr. Lamonte Sakai  Synopsis 63 year old woman with a history of hypertension, depression, neuropathy.  She is a smoker (approximately 25 pack years).  She presented to the ED  with several months of progressive dyspnea and a nonproductive cough, worse over the last several days. No reported sick contacts.  CT-PA 10/21 reviewed, shows small left lower lobe PA pulmonary embolism, and a large partially cavitary right upper lobe irregularly shaped mass that extends into the hilar region, encases the right upper lobe PA and appears to extend is a soft tissue lesion into the LA.  There is a small pericardial effusion.  She was  admitted and treated with  Heparin for anticoagulation, empiric azithromycin and ceftriaxone for possible CAP, and tissue sampling. She underwent Bronch with tissue sampling 09/02/2019.Cytology revealed  squamous cell carcinoma of the R upper lung. Staging is Stage IV ([id number]).She is currently being followed by oncology. She is being treated with Xarelto for her PE, and osimertinib 52m daily for her lung cancer. She understands that this is palliative treatment. She has quit smoking completely.   Admit date: 08/31/2019 Discharge date: 09/04/2019  Primary Care Physician:  HMarin Olp MD Oncology: Dr. 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 Presents for hospital follow up. She was hospitalized 10/20-10/24/2020 for treatment of PE, evaluation of right  pulmonary mass with bronchoscopy and tissue sampling by Dr. ByrLamonte Sakais noted above patient has Stage 4   (cT([id number] cell  carcinoma of the R upper lung, with  associated direct extension to the right perihilar region and left atrium as well as associated right perihilar and low right paratracheal nodal metastases.There is also  Osseous metastases involving the L3 vertebral body, right iliac bone, and left femoral head/neck.  She has had follow up with oncology.She is currently taking osimertinib 74m43mily to shrink her tumor burdon, and improve her quality of life. She continues to have shortness of breath with activity. None at rest. She is in a wheelchair at present as she is deconditioned. She states she feels much better than she did while in the hospital. She and her daughter did some shopping this week ( wearing masks and taking precautions)  Pt. States she has been having strange dreams at night. She is going to start Unisom at bedtime  to see if it helps with her sleep issues. Her daughter feels this may be stress and coming to terms with her diagnosis of lung cancer. Per her daughter, who is doing a really good job caring for her mother, she has a dry cough at times.She is compliant with her XaraJennye Moccasine will complete her starter pack this week, and she will start her regular Xaralto next week. I have told the patient and her daughter that it is important to take this medication daily without fail. I have also told them to make sure they call 5 days in advance of needing refill. The patient is debilitated and thin.  She has an appointment with dietitian for nutritional counseling and support. She was found to have iron deficiency, and is scheduled to get  IV iron x 2 weeks. She is also taking iron supplements po and Vitamin D She does not want the flu shot. We discussed that she is at risk for infection and she needs to be diligent about wearing her mask and minimizing exposures.We discussed that contracting the flu, and running the risk of post viral pneumonia would be catastrophic to her at this point. She verbalized  understanding.  Pt is unsteady on her feet. She would most likely benefit from a wheel chair. We walked her in the office today to ensure she was not desaturating. We need to watch carefully and add supplemental oxygen as needed. We will do PFT's to see if she may benefit from maintenance therapy. I have provided a prescription for  Albuterol today for use as needed for shortness of breath or wheezing. She would most likely benefit from a wheel chair.  Test Results: CT chest 10/21 >> small left lower lobe PA pulmonary embolism, a large partially cavitary right upper lobe irregularly shaped mass that extends into the hilar region, encases the right upper lobe PA and appears to extend is a soft tissue lesion into the LA.  There is a small pericardial effusion.  Bronchoscopy with transbronchial brushings 10/22 >> squamous cell lung cancer  09/02/19 CXR Stable appearing right lung mass with hilar adenopathy. No post bronchoscopy pneumothorax is seen.  11/10 PET 5.4 cm medial right upper lobe mass, corresponding to the patient's known primary bronchogenic neoplasm. Associated direct extension to the right perihilar region and left atrium.  Associated right perihilar and low right paratracheal nodal metastases.  Multifocal right lung opacities, likely reflecting a combination of tumor and postobstructive opacity/infection. Trace right pleural effusion.  Osseous metastases involving the L3 vertebral body, right iliac bone, and left femoral head/neck.  Otherwise, no evidence of metastatic disease in the abdomen/pelvis.  CBC Latest Ref Rng & Units 09/23/2019 09/10/2019 09/04/2019  WBC 4.0 - 10.5 K/uL 8.6 11.7(H) 8.9  Hemoglobin 12.0 - 15.0 g/dL 8.5(L) 9.2(L) 9.3(L)  Hematocrit 36.0 - 46.0 % 27.8(L) 30.0(L) 31.3(L)  Platelets 150 - 400 K/uL 407(H) 401(H) 352    BMP Latest Ref Rng & Units 09/23/2019 09/10/2019 09/04/2019  Glucose 70 - 99 mg/dL 131(H) 110(H) 105(H)  BUN 8 - 23 mg/dL  25(H) 16 25(H)  Creatinine 0.44 - 1.00 mg/dL 1.47(H) 1.14(H) 1.23(H)  Sodium 135 - 145 mmol/L 140 139 139  Potassium 3.5 - 5.1 mmol/L 4.5 4.0 4.3  Chloride 98 - 111 mmol/L 104 104 107  CO2 22 - 32 mmol/L _0 Calcium 8.9 - 10.3 mg/dL 9.5 9.8 9.1    BNP    Component Value Date/Time   BNP 146.1 (H) 08/31/2019 2245    ProBNP No results found for: PROBNP  PFT No results found for: FEV1PRE, FEV1POST, FVCPRE, FVCPOST, TLC, DLCOUNC, PREFEV1FVCRT, PSTFEV1FVCRT  Dg Chest 2 View  Result Date: 08/31/2019 CLINICAL DATA:  Chest pain, shortness of breath. EXAM: CHEST - 2 VIEW COMPARISON:  None. FINDINGS: The heart size and mediastinal contours are within normal limits. No pneumothorax or pleural effusion is noted. Left lung is clear. Right midlung and lower lobe opacities are noted concerning for atelectasis or possibly infiltrates. There is noted right perihilar rounded opacity concerning for possible neoplasm. The visualized skeletal structures are unremarkable. IMPRESSION: Right perihilar rounded opacity is noted concerning for possible neoplasm or malignancy. More peripheral opacities are noted concerning for inflammation or possible atelectasis. CT scan of the chest with intravenous contrast is recommended for further  evaluation. Electronically Signed   By: Marijo Conception M.D.   On: 08/31/2019 16:55   Ct Chest W Contrast  Result Date: 08/31/2019 CLINICAL DATA:  Abnormal chest x-ray shortness of breath EXAM: CT CHEST WITH CONTRAST TECHNIQUE: Multidetector CT imaging of the chest was performed during intravenous contrast administration. CONTRAST:  2m OMNIPAQUE IOHEXOL 300 MG/ML  SOLN COMPARISON:  Chest x-ray 08/31/2019 FINDINGS: Cardiovascular: Nonaneurysmal aorta. Moderate aortic atherosclerosis. No dissection seen. Slightly enlarged pulmonary trunk up to 3.5 cm. Borderline cardiomegaly. Small pericardial effusion measuring up to 14 mm in thickness. 19 mm lobulated filling defect within  the right side of the left atrium. Appears contiguous with soft tissue mass in the mediastinum. Coronary vascular calcification. Suspected small nonocclusive filling defect within subsegmental left lower lobe pulmonary artery, series 2, image number 103 and 104. Abduct truncation of the descending right pulmonary artery with essentially no significant enhancement of right middle or lower lobe pulmonary vessels. Narrowing of right upper lobe pulmonary arterial vessels by mediastinal soft tissue mass. Mediastinum/Nodes: Midline trachea. Subcentimeter hypodense thyroid nodules. Coarse calcifications in the thyroid. Esophagus within normal limits. Enlarged precarinal node measuring 16 mm. Subcarinal node measuring 14 mm. Ill-defined soft tissue density within the right hilus, encasing right upper lobe pulmonary vessels. Lungs/Pleura: Mild emphysema. Partially cavitary mass within the right upper lobe measuring approximately 4.1 cm x 6.4 cm by 3.8 cm. This is contiguous with ill-defined soft tissue mass/adenopathy within the mediastinum 12 mm irregular nodule in the right upper lobe, series 5, image number 49. Ill-defined consolidations within the right upper lobe and the right middle lobe. Diffuse ground-glass density in the right lower lobe with small consolidations. No pleural effusion or pneumothorax. Upper Abdomen: Numerous hypodense liver lesions, some of which are consistent with cysts. Atrophic partially visualized right kidney. Musculoskeletal: Incompletely visualized age indeterminate fracture at the anterior inferior endplate T12 IMPRESSION: 1. Large poorly defined partially cavitary mass in the right upper lobe measuring approximately 4.1 x 6.4 x 3.8 cm, contiguous with ill-defined mass and/or adenopathy within the mediastinum. 1.9 cm pedunculated filling defect within the right aspect of the left atrium, appears contiguous with mediastinal soft tissue density and is concerning for left atrial invasion. There  is narrowing and partial encasement of right upper lobe pulmonary arteries. 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 2. Small nonocclusive thrombus within subsegmental left lower lobe pulmonary artery. 3. 12 mm irregular right upper lobe pulmonary nodule which is either infectious, inflammatory, or neoplastic. Multifocal ill-defined consolidations in the right upper and middle lobes with diffuse ground-glass density in the right lower lobe, findings could be secondary to pneumonia, with consideration given to associated pulmonary infarcts given occluded appearance of right middle and lower lobe pulmonary arteries. 4. Numerous hypodense liver lesions, some of which are consistent with cysts. Given findings in the chest, recommend correlation with nonemergent MRI. 5. Incompletely visualized age indeterminate fracture inferior endplate T12 6. Small pericardial effusion Critical Value/emergent results were called by telephone at the time of interpretation on 08/31/2019 at 7:54 pm to [facility], who verbally acknowledged these results. Aortic Atherosclerosis (ICD10-I70.0) and Emphysema (ICD10-J43.9). Electronically Signed   By: KDonavan FoilM.D.   On: 08/31/2019 19:54   Mr BJeri CosWOIContrast  Result Date: 09/02/2019 CLINICAL DATA:  Small cell lung cancer staging EXAM: MRI HEAD WITHOUT AND WITH CONTRAST TECHNIQUE: Multiplanar, multiecho pulse sequences of the brain and surrounding structures were obtained without and with intravenous contrast. CONTRAST:  52m GADAVIST GADOBUTROL 1 MMOL/ML IV SOLN COMPARISON:  None. FINDINGS: Brain: No acute infarction, hemorrhage, hydrocephalus, extra-axial collection or mass lesion. Few small white matter  hyperintensities bilaterally likely due to chronic ischemia. No enhancing metastatic deposits in the brain. Leptomeningeal enhancement normal. Vascular: Normal arterial flow voids. 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 Skull and upper cervical spine: Negative Sinuses/Orbits: Negative Other: 6 x 18 mm cyst right temporal subcutaneous tissues. No enhancement. Probable benign dermal cyst. IMPRESSION: Negative for metastatic disease to the brain.  No acute abnormality. Electronically Signed   By: CFranchot GalloM.D.   On: 09/02/2019 21:43   Mr Abdomen W WCWContrast  Result Date: 09/03/2019 CLINICAL DATA:  Inpatient. New bronchoscopic diagnosis of right upper lobe squamous cell lung cancer. Indeterminate liver lesions on recent chest CT. EXAM: MRI ABDOMEN WITHOUT AND WITH CONTRAST TECHNIQUE: Multiplanar multisequence MR imaging of the abdomen was performed both before and after the administration of intravenous contrast. CONTRAST:  731mGADAVIST GADOBUTROL 1 MMOL/ML IV SOLN COMPARISON:  08/31/2019 chest CT. FINDINGS: Images are severely motion degraded, significantly limiting assessment. Lower chest: Hazy patchy right lung base opacity, correlating with patchy ground-glass opacity and consolidation at the right lung base on recent chest CT. Hepatobiliary: Normal liver size and configuration. No hepatic steatosis. Numerous homogeneous circumscribed T2 hyperintense lesions scattered throughout the liver, without definite enhancement on the severely motion degraded postcontrast sequences. Gallbladder appears contracted and is poorly evaluated due to motion degradation. No biliary ductal dilatation. Common bile duct diameter 3 mm. No evidence of choledocholithiasis. Pancreas: No pancreatic mass or duct dilation.  No pancreas divisum. Spleen: Normal size. No mass. Adrenals/Urinary Tract: No discrete adrenal lesions. Asymmetric moderate right renal atrophy. No  hydronephrosis. No suspicious renal masses. Subcentimeter simple renal cyst in the anterior interpolar left kidney. Stomach/Bowel: Normal non-distended stomach. Visualized small and large bowel is normal caliber, with no bowel wall thickening. Vascular/Lymphatic: Atherosclerotic nonaneurysmal abdominal aorta. Patent portal, splenic, hepatic and renal veins. No pathologically enlarged lymph nodes in the abdomen. Other: No abdominal ascites or focal fluid collection. Musculoskeletal: There is a small enhancing 1.1 cm T2 hypointense inferior L3 vertebral lesion (series 6/image 18). There is a similar enhancing 1.7 cm T2 hypointense medial right iliac bone lesion (series 6/image 11). IMPRESSION: 1. Severely motion degraded MRI, substantially limiting evaluation. 2. Enhancing L3 vertebral and medial right iliac T2 hypointense bone lesions, concerning for bone metastases. PET-CT strongly recommended on a short term outpatient basis for further characterization. 3. Numerous T2 hyperintense lesions scattered throughout the liver, without definite enhancement on the severely motion degraded postcontrast sequences, presumably benign liver cysts. The liver can be better staged on PET-CT. Electronically Signed   By: JaIlona Sorrel.D.   On: 09/03/2019 20:06   Nm Pet Image Initial (pi) Skull Base To Thigh  Result Date: 09/21/2019 CLINICAL DATA:  Initial treatment strategy for non-small cell lung cancer. EXAM: NUCLEAR MEDICINE PET SKULL BASE TO THIGH TECHNIQUE: 7.7 mCi F-18 FDG was injected intravenously. Full-ring PET imaging was performed from the skull base to thigh after the radiotracer. CT data was obtained and used for attenuation correction and anatomic localization. Fasting blood glucose: 92 mg/dl COMPARISON:  MRI abdomen dated 09/03/2019. CT chest dated 08/31/2019. FINDINGS: Mediastinal blood pool activity: SUV max 2.8  Liver activity: SUV max NA NECK: No hypermetabolic cervical lymphadenopathy. 12 mm right thyroid  nodule (series 4/image 46), max SUV 4.1. This is of questionable clinical significance given the additional findings. Incidental CT findings: none CHEST: 4.2 x 5.4 cm medial right upper lobe mass extending to the right perihilar region (series 8/image 36), likely reflecting a combination of neoplasm and postobstructive opacity, max SUV 15.4. 1.7 cm short axis right perihilar node, max SUV 13.2. Additional 9 mm short axis low right paratracheal node with hypermetabolism (series 4/image 64). Suspected direct extension of tumor into the left atrium also demonstrates hypermetabolism on PET (PET image 70). Additional patchy/nodular opacities in the right upper lobe (series 8/image 23) with max SUV 5.8, lateral right middle lobe (series 8/image 40) with max SUV 13.2, and posterior right lower lobe (series 8/image 85) with max SUV 5.7. This may reflect a combination of tumor and postobstructive opacity/infection. Trace right pleural effusion.  Left lung is clear. Incidental CT findings: Trace pericardial effusion. Atherosclerotic calcifications of the aortic arch. Mild coronary atherosclerosis the LAD and right coronary artery. ABDOMEN/PELVIS: No abnormal hypermetabolism in the liver, spleen, pancreas, or adrenal glands. Scattered hepatic cysts. No hypermetabolic lymphadenopathy in the abdomen/pelvis. Incidental CT findings: Right renal atrophy. Atherosclerotic calcifications abdominal aorta and branch vessels. Status post hysterectomy. SKELETON: Focal hypermetabolism in the L3 vertebral body (PET image 124), max SUV 7.9. Focal hypermetabolism in the right iliac bone, with associated mild sclerosis (series 4/image 152), max SUV 7.3. Mild focal hypermetabolism at the junction of the left femoral head/neck (PET image 172), max SUV 4.3. Incidental CT findings: Degenerative changes of the visualized thoracolumbar spine. IMPRESSION: 5.4 cm medial right upper lobe mass, corresponding to the patient's known primary bronchogenic  neoplasm. Associated direct extension to the right perihilar region and left atrium. Associated right perihilar and low right paratracheal nodal metastases. Multifocal right lung opacities, likely reflecting a combination of tumor and postobstructive opacity/infection. Trace right pleural effusion. Osseous metastases involving the L3 vertebral body, right iliac bone, and left femoral head/neck. Otherwise, no evidence of metastatic disease in the abdomen/pelvis. Electronically Signed   By: Julian Hy M.D.   On: 09/21/2019 11:30   Dg Chest Port 1 View  Result Date: 09/02/2019 CLINICAL DATA:  Status post bronchoscopy EXAM: PORTABLE CHEST 1 VIEW COMPARISON:  08/31/2019 FINDINGS: Cardiac shadow is stable. Persistent mass lesion in the right upper lobe is noted adjacent to the right hilum with hilar adenopathy identified. No post bronchoscopy pneumothorax is seen. No other focal abnormality is seen. IMPRESSION: Stable appearing right lung mass with hilar adenopathy. No post bronchoscopy pneumothorax is seen. Electronically Signed   By: [name] Catalina M.D.   On: 09/02/2019 08:40   Vas [place] Lower Extremity Venous (dvt)  Result Date: 09/02/2019  Lower Venous Study Indications: Pulmonary embolism.  Risk Factors: None identified. Limitations: Poor ultrasound/tissue interface and patient positioning, patient movement. Comparison Study: No prior studies. Performing Technologist: Oliver Hum RVT  Examination Guidelines: A complete evaluation includes B-mode imaging, spectral Doppler, color Doppler, and power Doppler as needed of all accessible portions of each vessel. Bilateral testing is considered an integral part of a complete examination. Limited examinations for reoccurring indications may be performed as noted.  +---------+---------------+---------+-----------+----------+--------------+  RIGHT     Compressibility Phasicity Spontaneity Properties Thrombus Aging   +---------+---------------+---------+-----------+----------+--------------+  CFV       Full            Yes       Yes                                    +---------+---------------+---------+-----------+----------+--------------+  SFJ       Full                                                             +---------+---------------+---------+-----------+----------+--------------+  FV Prox   Full                                                             +---------+---------------+---------+-----------+----------+--------------+  FV Mid    Full                                                             +---------+---------------+---------+-----------+----------+--------------+  FV Distal Full                                                             +---------+---------------+---------+-----------+----------+--------------+  PFV       Full                                                             +---------+---------------+---------+-----------+----------+--------------+  POP       Full            Yes       Yes                                    +---------+---------------+---------+-----------+----------+--------------+  PTV       Partial                                          Acute           +---------+---------------+---------+-----------+----------+--------------+  PERO      Full                                                             +---------+---------------+---------+-----------+----------+--------------+   +---------+---------------+---------+-----------+----------+--------------+  LEFT      Compressibility Phasicity Spontaneity Properties Thrombus Aging  +---------+---------------+---------+-----------+----------+--------------+  CFV       Full            Yes       Yes                                    +---------+---------------+---------+-----------+----------+--------------+  SFJ       Full                                                              +---------+---------------+---------+-----------+----------+--------------+  FV Prox   Full                                                             +---------+---------------+---------+-----------+----------+--------------+  FV Mid    Full                                                             +---------+---------------+---------+-----------+----------+--------------+  FV Distal Full                                                             +---------+---------------+---------+-----------+----------+--------------+  PFV       Full                                                             +---------+---------------+---------+-----------+----------+--------------+  POP       Partial         Yes       Yes                    Acute           +---------+---------------+---------+-----------+----------+--------------+  PTV       Full                                                             +---------+---------------+---------+-----------+----------+--------------+  PERO      Full                                                             +---------+---------------+---------+-----------+----------+--------------+  Gastroc   Full                                                             +---------+---------------+---------+-----------+----------+--------------+  SSV       Full                                                             +---------+---------------+---------+-----------+----------+--------------+     Summary: Right: Findings consistent with acute deep vein thrombosis involving the right posterior tibial veins. No cystic structure found in the popliteal fossa. Left: Findings consistent with acute deep vein thrombosis involving the left popliteal vein. No cystic structure found in the popliteal fossa.  *See table(s) above for measurements and observations. Electronically signed by Servando Snare MD on 09/02/2019 at 2:17:01 PM.    Final    Dg C-arm Bronchoscopy  Result Date: 09/02/2019 C-ARM  BRONCHOSCOPY: Fluoroscopy was utilized by the requesting physician.  No radiographic interpretation.     Past medical hx Past Medical History:  Diagnosis Date   Chicken pox    Hypertension      Social History   Tobacco Use   Smoking status: Former Smoker    Packs/day: 0.50    Years: 45.00    Pack years: 22.50    Types: Cigarettes    Start date: 11/11/1969    Quit date: 09/01/2019    Years since quitting: 0.0   Smokeless tobacco: Never Used   Tobacco comment: 2 cigarettes a day  Substance Use Topics   Alcohol use: No    Frequency: Never   Drug use: No    Brianna Saunders reports that she quit smoking about 4 weeks ago. Her smoking use included cigarettes. She started smoking about 49 years ago. She has a 22.50 pack-year smoking history. She has never used smokeless tobacco. She reports that she does not drink alcohol or use drugs.  Tobacco Cessation: Pt. Has quit smoking cigarettes since discharge  Past surgical hx, Family hx, Social hx all reviewed.  Current Outpatient Medications on File Prior to Visit  Medication Sig   baclofen (LIORESAL) 10 MG tablet TAKE 1 TABLET BY MOUTH THREE TIMES A DAY AS NEEDED FOR MUSCLE SPASMS (Patient taking differently: Take 10 mg by mouth 3 (three) times daily. )   benzonatate (TESSALON) 100 MG capsule Take 1 capsule (100 mg total) by mouth 3 (three) times daily as needed for cough.   buPROPion (WELLBUTRIN XL) 150 MG 24 hr tablet Take 1 tablet (150 mg total) by mouth daily.   chlorhexidine (PERIDEX) 0.12 % solution Rinse with 15 mls twice daily for 30 seconds. Use after breakfast and at bedtime. Spit out excess. Do not swallow.   citalopram (CELEXA) 40 MG tablet Take 1 tablet (40 mg total) by mouth daily.   gabapentin (NEURONTIN) 300 MG capsule Take 2 capsules (600 mg total) by mouth 3 (three) times daily.   guaiFENesin-dextromethorphan (ROBITUSSIN DM) 100-10 MG/5ML syrup Take 5 mLs by mouth every 4 (four) hours as needed for cough.    HYDROcodone-acetaminophen (NORCO) 5-325 MG tablet Take 1 tablet by mouth every 8 (eight) hours as needed for moderate pain.   metoprolol tartrate (LOPRESSOR) 50 MG tablet Take 1 tablet (50 mg total) by mouth 2 (two) times daily.   nicotine (NICODERM CQ - DOSED IN MG/24 HOURS) 14 mg/24hr patch Place 1 patch (14 mg total) onto the skin daily.   osimertinib mesylate (TAGRISSO) 80 MG tablet Take 1 tablet (80 mg total) by mouth daily.  rivaroxaban (XARELTO) 20 MG TABS tablet Take 1 tablet (20 mg total) by mouth daily with supper. Please start this after you have completed the starter pack.   Vitamin D, Ergocalciferol, (DRISDOL) 1.25 MG (50000 UT) CAPS capsule TAKE 1 CAPSULE (50,000 UNITS TOTAL) BY MOUTH EVERY 7 (SEVEN) DAYS.   No current facility-administered medications on file prior to visit.      No Known Allergies  Review Of Systems:  Constitutional:   + weight loss, night sweats,  Fevers, chills, + fatigue, or  lassitude.  HEENT:   No headaches,  Difficulty swallowing,  Tooth/dental problems, or  Sore throat,                No sneezing, itching, ear ache, nasal congestion, post nasal drip,   CV:  No chest pain,  Orthopnea, PND, swelling in lower extremities, anasarca, dizziness, palpitations, syncope.   GI  No heartburn, indigestion, abdominal pain, nausea, vomiting, diarrhea, change in bowel habits,+  loss of appetite, bloody stools.   Resp: + shortness of breath with exertion or at rest.  No excess mucus, no productive cough,  + non-productive cough,  No coughing up of blood.  No change in color of mucus.  No wheezing.  No chest wall deformity  Skin: no rash or lesions.  GU: no dysuria, change in color of urine, no urgency or frequency.  No flank pain, no hematuria   MS:  No joint pain or swelling.  No decreased range of motion.  + back pain, deconditioned.  Psych:  No change in mood or affect. No depression or anxiety.  No memory loss.   Vital Signs BP 110/68 (BP Location:  Right Arm, Patient Position: Sitting, Cuff Size: Normal)    Pulse 100    Temp (!) 97.1 F (36.2 C) (Temporal)    Ht _0  (1.626 m)    Wt 158 lb (71.7 kg)    SpO2 95%    BMI 27.12 kg/m    Physical Exam:  General- No distress,  A&Ox3, pleasant older female in a wheelchair ENT: No sinus tenderness, TM clear, pale nasal mucosa, no oral exudate,no post nasal drip, no LAN Cardiac: S1, S2, regular rate and rhythm, no murmur Chest: No wheeze/ rales/ dullness; no accessory muscle use, no nasal flaring, no sternal retractions Abd.: Soft Non-tender, ND, BS + Ext: No clubbing cyanosis, edema Neuro:  Deconditioned at baseline and weak Skin: No rashes, warm and dry Psych: normal mood and behavior, reflective    Assessment/Plan  Complicated right upper lobe and right hilar mass that appears to invade the left atrium (versus left atrial clot?)  and impact the right upper lobe PA. 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 There is Hilar involvement and Osseous metastases  She has a coexisting small left-sided PE.  Dyspnea on exertion Plan We will do a walk today to see if you are dropping your oxygen saturations with exercise>> NO drop in sats with walk We will do an overnight oximetry to see if you are dropping your oxygen levels with sleep.  You will get a call to schedule this. We will set up Pulmonary Function Tests  to evaluate for pulmonary disease We will send in prescription for albuterol inhaler ( Pro Air) and spacer for use with shortness of breath or wheezing. Continue Xaralto 20 mg daily as you have been doing It is important that you take this medication every day without fail. Call for refills 5 days in advance  Use Bleeding Precautions. Seek emergency care for bleeding that does not stop. Let us know if you change your mind about getting a flu shot.  Remember you are high risk for virus and infection. Continue wearing your mask, hand washing and  avoid crowds. Follow up with oncology as scheduled Follow up with nutrition as is scheduled Follow up in 2 months with Judson Roch NP or Dr. Lamonte Sakai  after PFT's to review and see if you would benefit from maintenance inhaler therapy Please contact office for sooner follow up if symptoms do not improve or worsen or seek emergency care  Take care of yourself. Call us if you need [place] for anything. Congratulations on quitting smoking.  We are so proud of you!!  -Need for Palliative counseling/ Hospice - Focus on quality of life - Both daughter and patient verbalize that they understand current treatment will not cure her cancer, but will provide her with better quality of life Plan Ongoing Daughter is taking excellent care of her mother, but may need additional emotional support.  Postobstructive versus community-acquired pneumonia - completed treatment - Last CXR>> No infiltrate, stable right lung mass with hilar adenopathy  Tobacco abuse, suspected COPD Pt has quit smoking since discharge from hospital I have congratulated her , and told her I am proud of her   [name], NP 09/29/2019  7:57 PM

## 2019-09-29 NOTE — Patient Instructions (Addendum)
It is good to see you today. I'm so glad you are feeling better.  We will do a walk today to see if you are dropping your oxygen saturations with exercise We will do an overnight oximetry to see if you are dropping your oxygen levels with sleep.  You will get a call to schedule this. We will set up Pulmonary Function Tests  to evaluate for pulmonary disease We will send in prescription for albuterol inhaler ( Pro Air) and spacer for use with shortness of breath or wheezing. Continue Xaralto 20 mg daily as you have been doing It is important that you take this medication every day without fail.  Use Bleeding Precautions. Seek emergency care for bleeding that does not stop. Let us know if you change your mind about getting a flu shot.  Remember you are high risk for virus and infection. Continue wearing your mask, hand washing and avoid crowds. Follow up with oncology as scheduled Follow up with nutrition as is scheduled Follow up in 2 months with Judson Roch NP or Dr. Lamonte Sakai  after PFT's to review and see if you would benefit from maintenance inhaler therapy Please contact office for sooner follow up if symptoms do not improve or worsen or seek emergency care  Take care of yourself. Call us if you need [place] for anything. Congratulations on quitting smoking.  We are so proud of you!!

## 2019-09-29 NOTE — Telephone Encounter (Signed)
Can we schedule PFT for pt in 2 months or first available here, OV after with SG. Thanks so much.

## 2019-09-29 NOTE — Telephone Encounter (Signed)
I am going to call pt to schedule.  Nothing further needed.

## 2019-09-30 ENCOUNTER — Other Ambulatory Visit (HOSPITAL_COMMUNITY): Payer: Medicare Other

## 2019-09-30 ENCOUNTER — Ambulatory Visit (HOSPITAL_COMMUNITY): Payer: Medicare Other

## 2019-10-05 ENCOUNTER — Telehealth: Payer: Self-pay | Admitting: Acute Care

## 2019-10-05 ENCOUNTER — Inpatient Hospital Stay: Payer: Medicare Other | Admitting: Nutrition

## 2019-10-05 ENCOUNTER — Inpatient Hospital Stay: Payer: Medicare Other

## 2019-10-05 ENCOUNTER — Encounter: Payer: Self-pay | Admitting: Hematology

## 2019-10-05 ENCOUNTER — Inpatient Hospital Stay (HOSPITAL_BASED_OUTPATIENT_CLINIC_OR_DEPARTMENT_OTHER): Payer: Medicare Other | Admitting: Hematology

## 2019-10-05 ENCOUNTER — Other Ambulatory Visit: Payer: Self-pay

## 2019-10-05 ENCOUNTER — Encounter: Payer: Medicare Other | Admitting: Nutrition

## 2019-10-05 VITALS — BP 119/75 | HR 89 | Temp 98.9°F | Resp 17

## 2019-10-05 DIAGNOSIS — C3491 Malignant neoplasm of unspecified part of right bronchus or lung: Secondary | ICD-10-CM | POA: Diagnosis not present

## 2019-10-05 DIAGNOSIS — I82403 Acute embolism and thrombosis of unspecified deep veins of lower extremity, bilateral: Secondary | ICD-10-CM | POA: Diagnosis not present

## 2019-10-05 DIAGNOSIS — N1831 Chronic kidney disease, stage 3a: Secondary | ICD-10-CM

## 2019-10-05 DIAGNOSIS — I2693 Single subsegmental pulmonary embolism without acute cor pulmonale: Secondary | ICD-10-CM | POA: Diagnosis not present

## 2019-10-05 DIAGNOSIS — N183 Chronic kidney disease, stage 3 unspecified: Secondary | ICD-10-CM | POA: Diagnosis not present

## 2019-10-05 DIAGNOSIS — C3411 Malignant neoplasm of upper lobe, right bronchus or lung: Secondary | ICD-10-CM | POA: Diagnosis not present

## 2019-10-05 DIAGNOSIS — D649 Anemia, unspecified: Secondary | ICD-10-CM

## 2019-10-05 DIAGNOSIS — C7951 Secondary malignant neoplasm of bone: Secondary | ICD-10-CM | POA: Diagnosis not present

## 2019-10-05 DIAGNOSIS — D631 Anemia in chronic kidney disease: Secondary | ICD-10-CM | POA: Diagnosis not present

## 2019-10-05 DIAGNOSIS — Z7901 Long term (current) use of anticoagulants: Secondary | ICD-10-CM | POA: Diagnosis not present

## 2019-10-05 LAB — CBC WITH DIFFERENTIAL (CANCER CENTER ONLY)
Abs Immature Granulocytes: 0.03 10*3/uL (ref 0.00–0.07)
Basophils Absolute: 0 10*3/uL (ref 0.0–0.1)
Basophils Relative: 0 %
Eosinophils Absolute: 0.6 10*3/uL — ABNORMAL HIGH (ref 0.0–0.5)
Eosinophils Relative: 7 %
HCT: 26.1 % — ABNORMAL LOW (ref 36.0–46.0)
Hemoglobin: 8.1 g/dL — ABNORMAL LOW (ref 12.0–15.0)
Immature Granulocytes: 0 %
Lymphocytes Relative: 15 %
Lymphs Abs: 1.4 10*3/uL (ref 0.7–4.0)
MCH: 26 pg (ref 26.0–34.0)
MCHC: 31 g/dL (ref 30.0–36.0)
MCV: 83.9 fL (ref 80.0–100.0)
Monocytes Absolute: 0.4 10*3/uL (ref 0.1–1.0)
Monocytes Relative: 5 %
Neutro Abs: 6.5 10*3/uL (ref 1.7–7.7)
Neutrophils Relative %: 73 %
Platelet Count: 297 10*3/uL (ref 150–400)
RBC: 3.11 MIL/uL — ABNORMAL LOW (ref 3.87–5.11)
RDW: 18.8 % — ABNORMAL HIGH (ref 11.5–15.5)
WBC Count: 9.1 10*3/uL (ref 4.0–10.5)
nRBC: 0 % (ref 0.0–0.2)

## 2019-10-05 LAB — CMP (CANCER CENTER ONLY)
ALT: 9 U/L (ref 0–44)
AST: 13 U/L — ABNORMAL LOW (ref 15–41)
Albumin: 4 g/dL (ref 3.5–5.0)
Alkaline Phosphatase: 49 U/L (ref 38–126)
Anion gap: 7 (ref 5–15)
BUN: 19 mg/dL (ref 8–23)
CO2: 27 mmol/L (ref 22–32)
Calcium: 9.3 mg/dL (ref 8.9–10.3)
Chloride: 105 mmol/L (ref 98–111)
Creatinine: 1.38 mg/dL — ABNORMAL HIGH (ref 0.44–1.00)
GFR, Est AFR Am: 47 mL/min — ABNORMAL LOW (ref >60)
GFR, Estimated: 40 mL/min — ABNORMAL LOW (ref >60)
Glucose, Bld: 115 mg/dL — ABNORMAL HIGH (ref 70–99)
Potassium: 4.4 mmol/L (ref 3.5–5.1)
Sodium: 139 mmol/L (ref 135–145)
Total Bilirubin: 0.2 mg/dL — ABNORMAL LOW (ref 0.3–1.2)
Total Protein: 8 g/dL (ref 6.5–8.1)

## 2019-10-05 MED ORDER — PROCHLORPERAZINE MALEATE 10 MG PO TABS: 10.0000 mg | ORAL_TABLET | Freq: Three times a day (TID) | ORAL | 2 refills | Status: AC | PRN

## 2019-10-05 MED ORDER — SODIUM CHLORIDE 0.9 % IV SOLN
510.0000 mg | Freq: Once | INTRAVENOUS | Status: AC
  Administered 2019-10-05: 510 mg via INTRAVENOUS
  Filled 2019-10-05: qty 17

## 2019-10-05 MED ORDER — SODIUM CHLORIDE 0.9 % IV SOLN
Freq: Once | INTRAVENOUS | Status: AC
  Administered 2019-10-05: 14:00:00 via INTRAVENOUS
  Filled 2019-10-05: qty 250

## 2019-10-05 MED ORDER — HYDROCODONE-ACETAMINOPHEN 5-325 MG PO TABS: 1.0000 | ORAL_TABLET | Freq: Three times a day (TID) | ORAL | 0 refills | Status: DC | PRN

## 2019-10-05 MED ORDER — RIVAROXABAN 20 MG PO TABS: 20.0000 mg | ORAL_TABLET | Freq: Every day | ORAL | 1 refills | Status: DC

## 2019-10-05 MED ORDER — ONDANSETRON HCL 8 MG PO TABS: 8.0000 mg | ORAL_TABLET | Freq: Three times a day (TID) | ORAL | 2 refills | Status: AC | PRN

## 2019-10-05 NOTE — Progress Notes (Signed)
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 She is a patient of Dr. Maylon Peppers. She receives palliative treatment.  PMH includes HTN, Depression, Tobacco.  Medications include Wellbutrin and Celexa  Labs include Glucose 131, BUN 25, and Creat 1.47  Height: 5'4". Weight: 158 pounds on Nov 18. UBW: 172 pounds May BMI: 21.12.  Patient has poor appetite, taste alterations and early satiety. She describes "nausea like" symptoms when she looks at food. She does not have an anti-emetic. She is drinking original Ensure provides 250 kcal and 9 grams protein. She tries to drink 2 daily. 8% weight loss over 6 months.  Nutrition Diagnosis: Educated patient to eat smaller amounts of food more often. Encouraged high calorie, high protein foods. Recommend change supplements to Ensure Enlive provides 350 kcal and 20 grams protein per bottle. Increase to TID. Try anti-emetics for "nausea like" symptoms. Fact sheets given. Questions answered and teach back method used. Contact information provided. Provided one complimentary case of ensure enlive.  Monitoring, Evaluation, Goals: Patient will increase calories and protein to minimize weight loss.  Next Visit: Patient will contact me for questions.

## 2019-10-05 NOTE — Progress Notes (Signed)
[facility] OFFICE PROGRESS NOTE  Patient Care Team: Marin Olp, MD as PCP - General (Family Medicine) [name], MD as Medical Oncologist (Oncology) Cordelia Poche, RN as Oncology Nurse Navigator  HEME/ONC OVERVIEW: 1. 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 -Patient started osimertinib on 09/24/2019, and is tolerating it well so far -Continue osimertinib '80mg'$  daily -We will plan to obtain interim scans in ~2 months after being on treatment to assess disease response -PRN anti-emetics: Zofran, Compazine  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  -Cleared by dentistry -Once the patient completes IV iron infusion, we will determine a date to initiate Xgeva  -I have also recommended the patient to take Ca-Vit D supplement daily  -Continue PRN Norco for pain   Acute LLL PTE and bilateral DVT -Currently on Eliquis; patient denies any abnormal bleeding or bruising -Goal of anticoagulation is lifelong -Continue Xarelto '20mg'$  daily   Normocytic anemia -Most likely multifactorial, hemodilution, anemia of  chronic disease, and CKD -Hgb 8.1 today, slightly lower than the last visit -We have scheduled the patient for 2 doses of IV iron (Feraheme)  -If no improvement in Hgb after IV iron, we can consider adding Aranesp for CKD-related anemia   Stage III CKD -Cr fluctuating between 1.2 and 1.4 since 2019  -Cr 1.38 today, stable; electrolytes normal -Patient was counseled to avoid NSAIDs  -We will monitor closely  No orders of the defined types were placed in this encounter.  All questions were answered. The patient knows to call the clinic with any problems, questions or concerns. No barriers to learning was detected.  Return in 2 weeks for labs and clinic follow-up.  [name], MD 10/05/2019 2:47 PM  CHIEF COMPLAINT: "My breathing is a little better"  INTERVAL HISTORY: Brianna Saunders returns to clinic for follow-up of metastatic squamous cell carcinoma of the right lung on [place].  Patient reports that since starting tube refill, she has felt some improvement in her breathing, and she is able to walk longer distance that she used to.  She is tolerating Tagrisso well without significant side effects, such as nausea, vomiting, diarrhea, or rash.  She takes Norco as needed with relief of the back pain.  She denies any other complaint today.  REVIEW OF SYSTEMS:   Constitutional: ( - ) fevers, ( - )  chills , ( - ) night sweats Eyes: ( - ) blurriness of vision, ( - ) double vision, ( - ) watery eyes Ears, nose, mouth, throat, and face: ( - ) mucositis, ( - ) sore throat Respiratory: ( - )  cough, ( + ) dyspnea, ( - ) wheezes Cardiovascular: ( - ) palpitation, ( - ) chest discomfort, ( - ) lower extremity swelling Gastrointestinal:  ( - ) nausea, ( - ) heartburn, ( - ) change in bowel habits Skin: ( - ) abnormal skin rashes Lymphatics: ( - ) new lymphadenopathy, ( - ) easy bruising Neurological: ( - ) numbness, ( - ) tingling, ( - ) new weaknesses Behavioral/Psych: ( - ) mood change, ( - ) new  changes  All other systems were reviewed with the patient and are negative.  SUMMARY OF ONCOLOGIC HISTORY: Oncology History  Squamous cell carcinoma of lung, right ([place])  08/31/2019 Initial Diagnosis   Squamous cell carcinoma of lung, right ([place])   08/31/2019 Imaging   CT chest w/ contrast: IMPRESSION: 1. Large poorly defined partially cavitary mass in the right upper lobe measuring approximately 4.1 x 6.4 x 3.8 cm, contiguous with ill-defined mass and/or adenopathy within the mediastinum. 1.9 cm pedunculated filling defect within the right aspect of the left atrium, appears contiguous with mediastinal soft tissue density and is concerning for left atrial invasion. There is narrowing and partial encasement of right upper lobe pulmonary arteries. 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 2. Small nonocclusive thrombus within subsegmental left lower lobe pulmonary artery. 3. 12 mm irregular right upper lobe pulmonary nodule which is either infectious, inflammatory, or neoplastic. Multifocal ill-defined consolidations in the right upper and middle lobes with diffuse ground-glass density in the right lower lobe, findings could be secondary to pneumonia, with consideration given to associated pulmonary infarcts given occluded appearance of right middle and lower lobe pulmonary arteries. 4. Numerous hypodense liver lesions, some of which are consistent with cysts. Given findings in the chest, recommend correlation with nonemergent MRI. 5. Incompletely visualized age indeterminate fracture inferior endplate T12 6. Small pericardial effusion   09/01/2019 Imaging   Doppler: Summary: Right: Findings consistent with acute  deep vein thrombosis involving the right posterior tibial veins. No cystic structure found in the popliteal fossa. Left: Findings consistent with acute deep vein thrombosis involving the left popliteal vein. No cystic structure found in the popliteal fossa.   09/02/2019 Imaging   MRI brain w/ contrast:IMPRESSION: Negative for metastatic disease to the brain.  No acute abnormality.   09/02/2019 Pathology Results   FINAL MICROSCOPIC DIAGNOSIS:   A. LUNG, RIGHT UPPER LOBE, BIOPSY:  - Benign bronchial epithelium.    09/02/2019 Pathology Results   Clinical History: Transbronchial brushings, RUL  Specimen Submitted:  A. LUNG, RIGHT UPPER LOBE, BRUSHINGS:    FINAL MICROSCOPIC DIAGNOSIS:  - Malignant cells consistent with squamous cell carcinoma    09/02/2019 Imaging   Echocardiogram: IMPRESSIONS      1. Left ventricular ejection fraction, by visual estimation, is 60 to 65%. The left ventricle has normal function. There is mildly increased left ventricular hypertrophy.  2. Left ventricular diastolic Doppler parameters are consistent with impaired relaxation pattern of LV diastolic filling.  3. Global right ventricle has mildly reduced systolic function.The right ventricular size is normal. No increase in right ventricular wall thickness.  4. Left atrial size was normal.  5. Right atrial size was normal.  6. The mitral valve is grossly normal. Trace mitral valve regurgitation.  7. The tricuspid valve is grossly  normal. Tricuspid valve regurgitation was not visualized by color flow Doppler.  8. The aortic valve is tricuspid Aortic valve regurgitation was not visualized by color flow Doppler.  9. The pulmonic valve was grossly normal. Pulmonic valve regurgitation is not visualized by color flow Doppler. 10. The inferior vena cava is normal in size with <50% respiratory variability, suggesting right atrial pressure of 8 mmHg. 11. Small pericardial effusion. 12. The pericardial effusion is RV  - inferior wall.   09/02/2019 Imaging   MRI brain: IMPRESSION: Negative for metastatic disease to the brain.  No acute abnormality.    09/03/2019 Imaging   MRI abdomen: IMPRESSION: 1. Severely motion degraded MRI, substantially limiting evaluation. 2. Enhancing L3 vertebral and medial right iliac T2 hypointense bone lesions, concerning for bone metastases. PET-CT strongly recommended on a short term outpatient basis for further characterization. 3. Numerous T2 hyperintense lesions scattered throughout the liver, without definite enhancement on the severely motion degraded postcontrast sequences, presumably benign liver cysts. The liver can be better staged on PET-CT.   09/21/2019 Imaging   PET: IMPRESSION: 5.4 cm medial right upper lobe mass, corresponding to the patient's known primary bronchogenic neoplasm. Associated direct extension to the right perihilar region and left atrium.   Associated right perihilar and low right paratracheal nodal metastases.   Multifocal right lung opacities, likely reflecting a combination of tumor and postobstructive opacity/infection. Trace right pleural effusion.   Osseous metastases involving the L3 vertebral body, right iliac bone, and left femoral head/neck.   Otherwise, no evidence of metastatic disease in the abdomen/pelvis.     I have reviewed the past medical history, past surgical history, social history and family history with the patient and they are unchanged from previous note.  ALLERGIES:  has No Known Allergies.  MEDICATIONS:  Current Outpatient Medications  Medication Sig Dispense Refill  . albuterol (VENTOLIN HFA) 108 (90 Base) MCG/ACT inhaler Inhale 2 puffs into the lungs every 6 (six) hours as needed for wheezing or shortness of breath. 18 g 2  . baclofen (LIORESAL) 10 MG tablet TAKE 1 TABLET BY MOUTH THREE TIMES A DAY AS NEEDED FOR MUSCLE SPASMS (Patient taking differently: Take 10 mg by mouth 3 (three) times  daily. ) 270 tablet 1  . benzonatate (TESSALON) 100 MG capsule Take 1 capsule (100 mg total) by mouth 3 (three) times daily as needed for cough. 60 capsule 2  . buPROPion (WELLBUTRIN XL) 150 MG 24 hr tablet Take 1 tablet (150 mg total) by mouth daily.    . chlorhexidine (PERIDEX) 0.12 % solution Rinse with 15 mls twice daily for 30 seconds. Use after breakfast and at bedtime. Spit out excess. Do not swallow. 480 mL prn  . citalopram (CELEXA) 40 MG tablet Take 1 tablet (40 mg total) by mouth daily. 90 tablet 1  . gabapentin (NEURONTIN) 300 MG capsule Take 2 capsules (600 mg total) by mouth 3 (three) times daily. 180 capsule 1  . guaiFENesin-dextromethorphan (ROBITUSSIN DM) 100-10 MG/5ML syrup Take 5 mLs by mouth every 4 (four) hours as needed for cough. 120 mL 1  . HYDROcodone-acetaminophen (NORCO) 5-325 MG tablet Take 1 tablet by mouth every 8 (eight) hours as needed for moderate pain. 30 tablet 0  . metoprolol tartrate (LOPRESSOR) 50 MG tablet Take 1 tablet (50 mg total) by mouth 2 (two) times daily. 180 tablet 1  . nicotine (NICODERM CQ - DOSED IN MG/24 HOURS) 14 mg/24hr patch Place 1 patch (14 mg total) onto the skin daily. 28 patch 1  .  osimertinib mesylate (TAGRISSO) 80 MG tablet Take 1 tablet (80 mg total) by mouth daily. 30 tablet 11  . rivaroxaban (XARELTO) 20 MG TABS tablet Take 1 tablet (20 mg total) by mouth daily with supper. Please start this after you have completed the starter pack. 30 tablet 4  . Vitamin D, Ergocalciferol, (DRISDOL) 1.25 MG (50000 UT) CAPS capsule TAKE 1 CAPSULE (50,000 UNITS TOTAL) BY MOUTH EVERY 7 (SEVEN) DAYS. 12 capsule 0   No current facility-administered medications for this visit.     PHYSICAL EXAMINATION: ECOG PERFORMANCE STATUS: 2 - Symptomatic, <50% confined to bed  There were no vitals filed for this visit. There is no height or weight on file to calculate BMI.  There were no vitals filed for this visit.  GENERAL: alert, no distress and  comfortable SKIN: skin color, texture, turgor are normal, no rashes or significant lesions EYES: conjunctiva are pink and non-injected, sclera clear NECK: supple, non-tender LUNGS: clear to auscultation with normal breathing effort HEART: regular rate & rhythm and no murmurs and no lower extremity edema ABDOMEN: soft, non-tender, non-distended, normal bowel sounds Musculoskeletal: no cyanosis of digits and no clubbing  PSYCH: alert & oriented x 3, fluent speech  LABORATORY DATA:  I have reviewed the data as listed    Component Value Date/Time   NA 139 10/05/2019 1253   K 4.4 10/05/2019 1253   CL 105 10/05/2019 1253   CO2 27 10/05/2019 1253   GLUCOSE 115 (H) 10/05/2019 1253   BUN 19 10/05/2019 1253   CREATININE 1.38 (H) 10/05/2019 1253   CALCIUM 9.3 10/05/2019 1253   PROT 8.0 10/05/2019 1253   ALBUMIN 4.0 10/05/2019 1253   AST 13 (L) 10/05/2019 1253   ALT 9 10/05/2019 1253   ALKPHOS 49 10/05/2019 1253   BILITOT 0.2 (L) 10/05/2019 1253   GFRNONAA 40 (L) 10/05/2019 1253   GFRAA 47 (L) 10/05/2019 1253    No results found for: SPEP, UPEP  Lab Results  Component Value Date   WBC 9.1 10/05/2019   NEUTROABS 6.5 10/05/2019   HGB 8.1 (L) 10/05/2019   HCT 26.1 (L) 10/05/2019   MCV 83.9 10/05/2019   PLT 297 10/05/2019      Chemistry      Component Value Date/Time   NA 139 10/05/2019 1253   K 4.4 10/05/2019 1253   CL 105 10/05/2019 1253   CO2 27 10/05/2019 1253   BUN 19 10/05/2019 1253   CREATININE 1.38 (H) 10/05/2019 1253      Component Value Date/Time   CALCIUM 9.3 10/05/2019 1253   ALKPHOS 49 10/05/2019 1253   AST 13 (L) 10/05/2019 1253   ALT 9 10/05/2019 1253   BILITOT 0.2 (L) 10/05/2019 1253       RADIOGRAPHIC STUDIES: I have personally reviewed the radiological images as listed below and agreed with the findings in the report. Nm Pet Image Initial (pi) Skull Base To Thigh  Result Date: 09/21/2019 CLINICAL DATA:  Initial treatment strategy for non-small  cell lung cancer. EXAM: NUCLEAR MEDICINE PET SKULL BASE TO THIGH TECHNIQUE: 7.7 mCi F-18 FDG was injected intravenously. Full-ring PET imaging was performed from the skull base to thigh after the radiotracer. CT data was obtained and used for attenuation correction and anatomic localization. Fasting blood glucose: 92 mg/dl COMPARISON:  MRI abdomen dated 09/03/2019. CT chest dated 08/31/2019. FINDINGS: Mediastinal blood pool activity: SUV max 2.8 Liver activity: SUV max NA NECK: No hypermetabolic cervical lymphadenopathy. 12 mm right thyroid nodule (series 4/image 46), max  SUV 4.1. This is of questionable clinical significance given the additional findings. Incidental CT findings: none CHEST: 4.2 x 5.4 cm medial right upper lobe mass extending to the right perihilar region (series 8/image 36), likely reflecting a combination of neoplasm and postobstructive opacity, max SUV 15.4. 1.7 cm short axis right perihilar node, max SUV 13.2. Additional 9 mm short axis low right paratracheal node with hypermetabolism (series 4/image 64). Suspected direct extension of tumor into the left atrium also demonstrates hypermetabolism on PET (PET image 70). Additional patchy/nodular opacities in the right upper lobe (series 8/image 23) with max SUV 5.8, lateral right middle lobe (series 8/image 40) with max SUV 13.2, and posterior right lower lobe (series 8/image 85) with max SUV 5.7. This may reflect a combination of tumor and postobstructive opacity/infection. Trace right pleural effusion.  Left lung is clear. Incidental CT findings: Trace pericardial effusion. Atherosclerotic calcifications of the aortic arch. Mild coronary atherosclerosis the LAD and right coronary artery. ABDOMEN/PELVIS: No abnormal hypermetabolism in the liver, spleen, pancreas, or adrenal glands. Scattered hepatic cysts. No hypermetabolic lymphadenopathy in the abdomen/pelvis. Incidental CT findings: Right renal atrophy. Atherosclerotic calcifications abdominal  aorta and branch vessels. Status post hysterectomy. SKELETON: Focal hypermetabolism in the L3 vertebral body (PET image 124), max SUV 7.9. Focal hypermetabolism in the right iliac bone, with associated mild sclerosis (series 4/image 152), max SUV 7.3. Mild focal hypermetabolism at the junction of the left femoral head/neck (PET image 172), max SUV 4.3. Incidental CT findings: Degenerative changes of the visualized thoracolumbar spine. IMPRESSION: 5.4 cm medial right upper lobe mass, corresponding to the patient's known primary bronchogenic neoplasm. Associated direct extension to the right perihilar region and left atrium. Associated right perihilar and low right paratracheal nodal metastases. Multifocal right lung opacities, likely reflecting a combination of tumor and postobstructive opacity/infection. Trace right pleural effusion. Osseous metastases involving the L3 vertebral body, right iliac bone, and left femoral head/neck. Otherwise, no evidence of metastatic disease in the abdomen/pelvis. Electronically Signed   By: Julian Hy M.D.   On: 09/21/2019 11:30

## 2019-10-05 NOTE — Patient Instructions (Signed)

## 2019-10-05 NOTE — Telephone Encounter (Signed)
Please see pt's email. Duplicate message. Will sign off.

## 2019-10-06 ENCOUNTER — Telehealth: Payer: Self-pay | Admitting: Hematology

## 2019-10-06 NOTE — Telephone Encounter (Signed)
Appointments scheduled letter/calendar mailed per 11/24 los

## 2019-10-12 ENCOUNTER — Inpatient Hospital Stay: Payer: Medicare Other | Attending: Hematology

## 2019-10-12 ENCOUNTER — Other Ambulatory Visit: Payer: Self-pay

## 2019-10-12 VITALS — BP 113/66 | HR 67 | Temp 98.0°F | Resp 24

## 2019-10-12 DIAGNOSIS — N183 Chronic kidney disease, stage 3 unspecified: Secondary | ICD-10-CM | POA: Insufficient documentation

## 2019-10-12 DIAGNOSIS — Z7901 Long term (current) use of anticoagulants: Secondary | ICD-10-CM | POA: Diagnosis not present

## 2019-10-12 DIAGNOSIS — E611 Iron deficiency: Secondary | ICD-10-CM | POA: Diagnosis not present

## 2019-10-12 DIAGNOSIS — C7951 Secondary malignant neoplasm of bone: Secondary | ICD-10-CM | POA: Diagnosis not present

## 2019-10-12 DIAGNOSIS — C3411 Malignant neoplasm of upper lobe, right bronchus or lung: Secondary | ICD-10-CM | POA: Insufficient documentation

## 2019-10-12 DIAGNOSIS — Z79899 Other long term (current) drug therapy: Secondary | ICD-10-CM | POA: Diagnosis not present

## 2019-10-12 DIAGNOSIS — D631 Anemia in chronic kidney disease: Secondary | ICD-10-CM | POA: Diagnosis not present

## 2019-10-12 DIAGNOSIS — D649 Anemia, unspecified: Secondary | ICD-10-CM

## 2019-10-12 MED ORDER — SODIUM CHLORIDE 0.9 % IV SOLN
Freq: Once | INTRAVENOUS | Status: AC
  Administered 2019-10-12: 13:00:00 via INTRAVENOUS
  Filled 2019-10-12: qty 250

## 2019-10-12 MED ORDER — SODIUM CHLORIDE 0.9 % IV SOLN
510.0000 mg | Freq: Once | INTRAVENOUS | Status: AC
  Administered 2019-10-12: 510 mg via INTRAVENOUS
  Filled 2019-10-12: qty 17

## 2019-10-12 NOTE — Progress Notes (Signed)
At 1332 pt. Stated that she is nauseated. She thinks is because she didn't eat anything today. Snack and beverage offered.  As soon as she ate she felt better. Iron infusion restarted with no further incidents.

## 2019-10-12 NOTE — Patient Instructions (Signed)

## 2019-10-15 DIAGNOSIS — I2693 Single subsegmental pulmonary embolism without acute cor pulmonale: Secondary | ICD-10-CM | POA: Diagnosis not present

## 2019-10-18 ENCOUNTER — Other Ambulatory Visit: Payer: Self-pay

## 2019-10-18 ENCOUNTER — Ambulatory Visit (INDEPENDENT_AMBULATORY_CARE_PROVIDER_SITE_OTHER): Payer: Medicare Other | Admitting: Acute Care

## 2019-10-18 ENCOUNTER — Inpatient Hospital Stay: Payer: Medicare Other

## 2019-10-18 ENCOUNTER — Encounter: Payer: Self-pay | Admitting: Hematology

## 2019-10-18 ENCOUNTER — Encounter: Payer: Self-pay | Admitting: Acute Care

## 2019-10-18 ENCOUNTER — Inpatient Hospital Stay (HOSPITAL_BASED_OUTPATIENT_CLINIC_OR_DEPARTMENT_OTHER): Payer: Medicare Other | Admitting: Hematology

## 2019-10-18 VITALS — BP 117/55 | HR 100 | Temp 97.1°F | Resp 19 | Ht 64.0 in | Wt 157.4 lb

## 2019-10-18 DIAGNOSIS — C3491 Malignant neoplasm of unspecified part of right bronchus or lung: Secondary | ICD-10-CM

## 2019-10-18 DIAGNOSIS — C3411 Malignant neoplasm of upper lobe, right bronchus or lung: Secondary | ICD-10-CM | POA: Diagnosis not present

## 2019-10-18 DIAGNOSIS — N1831 Chronic kidney disease, stage 3a: Secondary | ICD-10-CM

## 2019-10-18 DIAGNOSIS — C7951 Secondary malignant neoplasm of bone: Secondary | ICD-10-CM | POA: Diagnosis not present

## 2019-10-18 DIAGNOSIS — G4734 Idiopathic sleep related nonobstructive alveolar hypoventilation: Secondary | ICD-10-CM

## 2019-10-18 DIAGNOSIS — D649 Anemia, unspecified: Secondary | ICD-10-CM | POA: Diagnosis not present

## 2019-10-18 DIAGNOSIS — D631 Anemia in chronic kidney disease: Secondary | ICD-10-CM | POA: Diagnosis not present

## 2019-10-18 DIAGNOSIS — Z79899 Other long term (current) drug therapy: Secondary | ICD-10-CM | POA: Diagnosis not present

## 2019-10-18 DIAGNOSIS — I2693 Single subsegmental pulmonary embolism without acute cor pulmonale: Secondary | ICD-10-CM | POA: Diagnosis not present

## 2019-10-18 DIAGNOSIS — G4733 Obstructive sleep apnea (adult) (pediatric): Secondary | ICD-10-CM

## 2019-10-18 DIAGNOSIS — E611 Iron deficiency: Secondary | ICD-10-CM | POA: Diagnosis not present

## 2019-10-18 DIAGNOSIS — N183 Chronic kidney disease, stage 3 unspecified: Secondary | ICD-10-CM | POA: Diagnosis not present

## 2019-10-18 LAB — CMP (CANCER CENTER ONLY)
ALT: 16 U/L (ref 0–44)
AST: 15 U/L (ref 15–41)
Albumin: 4.2 g/dL (ref 3.5–5.0)
Alkaline Phosphatase: 54 U/L (ref 38–126)
Anion gap: 7 (ref 5–15)
BUN: 13 mg/dL (ref 8–23)
CO2: 27 mmol/L (ref 22–32)
Calcium: 9.5 mg/dL (ref 8.9–10.3)
Chloride: 106 mmol/L (ref 98–111)
Creatinine: 1.29 mg/dL — ABNORMAL HIGH (ref 0.44–1.00)
GFR, Est AFR Am: 51 mL/min — ABNORMAL LOW (ref >60)
GFR, Estimated: 44 mL/min — ABNORMAL LOW (ref >60)
Glucose, Bld: 96 mg/dL (ref 70–99)
Potassium: 4.8 mmol/L (ref 3.5–5.1)
Sodium: 140 mmol/L (ref 135–145)
Total Bilirubin: 0.3 mg/dL (ref 0.3–1.2)
Total Protein: 8 g/dL (ref 6.5–8.1)

## 2019-10-18 LAB — CBC WITH DIFFERENTIAL (CANCER CENTER ONLY)
Abs Immature Granulocytes: 0.03 10*3/uL (ref 0.00–0.07)
Basophils Absolute: 0 10*3/uL (ref 0.0–0.1)
Basophils Relative: 0 %
Eosinophils Absolute: 0.5 10*3/uL (ref 0.0–0.5)
Eosinophils Relative: 6 %
HCT: 26.6 % — ABNORMAL LOW (ref 36.0–46.0)
Hemoglobin: 8.1 g/dL — ABNORMAL LOW (ref 12.0–15.0)
Immature Granulocytes: 0 %
Lymphocytes Relative: 21 %
Lymphs Abs: 1.7 10*3/uL (ref 0.7–4.0)
MCH: 27.2 pg (ref 26.0–34.0)
MCHC: 30.5 g/dL (ref 30.0–36.0)
MCV: 89.3 fL (ref 80.0–100.0)
Monocytes Absolute: 0.5 10*3/uL (ref 0.1–1.0)
Monocytes Relative: 6 %
Neutro Abs: 5.4 10*3/uL (ref 1.7–7.7)
Neutrophils Relative %: 67 %
Platelet Count: 295 10*3/uL (ref 150–400)
RBC: 2.98 MIL/uL — ABNORMAL LOW (ref 3.87–5.11)
RDW: 22.3 % — ABNORMAL HIGH (ref 11.5–15.5)
WBC Count: 8.1 10*3/uL (ref 4.0–10.5)
nRBC: 0 % (ref 0.0–0.2)

## 2019-10-18 NOTE — Progress Notes (Signed)
Virtual Visit via Telephone Note  I connected with Brianna Saunders on 10/18/19 at  4:00 PM EST by telephone and verified that I am speaking with the correct person using two identifiers.  Location: Patient: At home Provider: At the office at [address], [place], [name], [address]   I discussed the limitations, risks, security and privacy concerns of performing an evaluation and management service by telephone and the availability of in person appointments. I also discussed with the patient that there may be a patient responsible charge related to this service. The patient expressed understanding and agreed to proceed.  Brianna Saunders is a 63 y.o. 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 She is followed by Dr. Lamonte Sakai   Synopsis 63 year old woman with a history of hypertension, depression, neuropathy. She is a smoker (approximately 25 pack years). She presented to the ED 08/31/2019  with several months of progressive dyspnea and a nonproductive cough, worse over the last several days. No reported sick contacts. CT-PA 10/21 reviewed, shows small left lower lobe PA pulmonary embolism, and a large partially cavitary right upper lobe irregularly shaped mass that extends into the hilar region, encases the right upper lobe PA and appears to extend is a soft tissue lesion into the LA. There is a small pericardial effusion. She was  admitted and treated with  Heparin for anticoagulation, empiric azithromycin and ceftriaxone for possible CAP, and tissue sampling. She underwent Bronch with tissue sampling 09/02/2019.Cytology revealed  squamous cell carcinoma of the R upper lung. Staging is Stage IV ([id number]).She is currently being followed by oncology. She is being treated with Xarelto for her PE, and osimertinib '80mg'$  dailyfor her lung cancer. She understands that this is  palliative treatment. She has quit smoking completely.    History of Present Illness: Pt. Presents today for follow up of an overnight oxygen  study done 10/14/2019. Per the results of the home sleep study, patient has desaturations for 52 minutes and 56 seconds of less than 88%. She had 35 desaturation events per hour. This  qualifies for nocturnal oxygen therapy based on the Medicare Group 1 Criteria. Per the patient's daughter the patient is not sleeping well at night . I explained that we will order her oxygen at 2 L [place] at bedtime. I explained that we will order the oxygen from a DME company, with all of the equipment. I told her it will be delivered and set up by the DME company. I have told her to call if they have any questions or concerns once they get the oxygen. I have set up a 10 day follow up to ensure they are managing well. Patient's daughter verbalized understanding.    Observations/Objective: 10/14/2019: Overnight Oximetry>> Time Sats < or = to 88% : 52.9 minutes  Time < or = to 89%: 64.9 minutes Qualifies for nocturnal oxygen therapy based on the Medicare Group 1 Criteria. Saturation Nadir 75% Time consecutive < or = to 88% : 5.6 minutes ODI >> 256 events, or 35 events per hour. >> Needs home sleep study   CT chest 10/21 >>small left lower lobe PA pulmonary embolism, a large partially cavitary right upper lobe irregularly shaped mass that extends into the hilar region, encases the right upper lobe PA and appears to extend is a soft tissue lesion into the LA. There is a small pericardial effusion.  Bronchoscopy with transbronchial brushings  10/22>>squamous cell lung cancer  09/02/19 CXR Stable appearing right lung mass with hilar adenopathy. No post bronchoscopy pneumothorax is seen.  11/10 PET 5.4 cm medial right upper lobe mass, corresponding to the patient's known primary bronchogenic neoplasm. Associated direct extension to the right perihilar region and left  atrium.  Associated right perihilar and low right paratracheal nodal metastases.  Multifocal right lung opacities, likely reflecting a combination of tumor and postobstructive opacity/infection. Trace right pleural effusion.  Osseous metastases involving the L3 vertebral body, right iliac bone, and left femoral head/neck.  Otherwise, no evidence of metastatic disease in the abdomen/pelvis.  Assessment and Plan: Nocturnal oxygen desaturations 52 minutes and 56 seconds of 88% Qualifies for nocturnal oxygen therapy based on the Medicare Group 1 Criteria. Plan We will order oxygen to be worn at bedtime We will order all appropriate equipment Wear oxygen at 2 L [place] at bedtime We will need to order a Home Sleep Study to evaluate for sleep apnea Someone will call you to schedule Please contact office for sooner follow up if symptoms do not improve or worsen or seek emergency care   Follow Up Instructions: Follow up video visit 10/27/2019 at 2 pm with Judson Roch NP to ensure you are managing and to evaluate benefit of therapy Follow up appointment as scheduled with PFT's 12/01/2019   I discussed the assessment and treatment plan with the patient. The patient was provided an opportunity to ask questions and all were answered. The patient agreed with the plan and demonstrated an understanding of the instructions.   The patient was advised to call back or seek an in-person evaluation if the symptoms worsen or if the condition fails to improve as anticipated.  I provided 35 minutes of non-face-to-face time during this encounter.   [name], NP 10/18/2019

## 2019-10-18 NOTE — Progress Notes (Signed)
[facility] OFFICE PROGRESS NOTE  Patient Care Team: Marin Olp, MD as PCP - General (Family Medicine) [name], MD as Medical Oncologist (Oncology) Cordelia Poche, RN as Oncology Nurse Navigator  HEME/ONC OVERVIEW: 1. 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 -Patient started osimertinib on 09/24/2019, and is tolerating it well so far -Continue osimertinib 71mg70mly -We will plan to obtain interim scans in ~2 months (ie mid- to late 11/2019) to assess disease response -PRN anti-emetics: Zofran, Compazine  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  -Cleared by dentistry -Tentatively schedule Xgeva to start on 11/09/2019  -I have also recommended the patient to take Ca-Vit D supplement daily  -Continue PRN Norco for pain   Acute LLL PTE and bilateral DVT -Currently on Eliquis; patient denies any abnormal bleeding or bruising -Goal of anticoagulation is lifelong -Continue Xarelto 20mg 45my   Normocytic anemia -Most likely multifactorial,  hemodilution, anemia of chronic disease, and CKD -S/p 2 doses of IV iron, last dose on 10/12/2019  -Hgb 8.1 today, stable -If Hgb does not improve in 3-4 weeks after IV iron infusion, we can consider adding Aranesp for CKD-related anemia   Stage III CKD -Cr fluctuating between 1.2 and 1.4 since 2019  -Cr 1.29 today, slightly improving; electrolytes normal -Patient was counseled to avoid NSAIDs  -We will monitor closely  No orders of the defined types were placed in this encounter.  All questions were answered. The patient knows to call the clinic with any problems, questions or concerns. No barriers to learning was detected.  Return in 3 weeks for labs, Xgeva injection and clinic appt.   Brianna Saunders ZhTish Men2/05/2019 12:20 PM  CHIEF COMPLAINT:  "I am doing okay"  INTERVAL HISTORY: Brianna Saunders to clinic for follow-up of metastatic squamous cell carcinoma of the right lung on [place].  Patient reports that since the last visit, her breathing has improved modestly, and she was able to walk to the bathroom without becoming severely short of breath.  She still gets somewhat dyspneic walking around the house.  She also notices occasional, involuntary "gasping", but she denies any associated chest pain, dyspnea, palpitations, or diaphoresis.  She denies any rash, abdominal pain, nausea, vomiting, diarrhea.  She has some pain on the left hip area, for which she takes Norco twice a day with reasonable pain control.  She denies any other complaint today.  REVIEW OF SYSTEMS:   Constitutional: ( - ) fevers, ( - )  chills , ( - ) night sweats  Eyes: ( - ) blurriness of vision, ( - ) double vision, ( - ) watery eyes Ears, nose, mouth, throat, and face: ( - ) mucositis, ( - ) sore throat Respiratory: ( - ) cough, ( + ) dyspnea, ( - ) wheezes Cardiovascular: ( - ) palpitation, ( - ) chest discomfort, ( - ) lower extremity swelling Gastrointestinal:  ( - ) nausea, ( - ) heartburn, ( - ) change in bowel  habits Skin: ( - ) abnormal skin rashes Lymphatics: ( - ) new lymphadenopathy, ( - ) easy bruising Neurological: ( - ) numbness, ( - ) tingling, ( - ) new weaknesses Behavioral/Psych: ( - ) mood change, ( - ) new changes  All other systems were reviewed with the patient and are negative.  SUMMARY OF ONCOLOGIC HISTORY: Oncology History  Squamous cell carcinoma of lung, right ([place])  08/31/2019 Initial Diagnosis   Squamous cell carcinoma of lung, right ([place])   08/31/2019 Imaging   CT chest w/ contrast: IMPRESSION: 1. Large poorly defined partially cavitary mass in the right upper lobe measuring approximately 4.1 x 6.4 x 3.8 cm, contiguous with ill-defined mass and/or adenopathy within the mediastinum. 1.9 cm pedunculated filling defect within the right aspect of the left atrium, appears contiguous with mediastinal soft tissue density and is concerning for left atrial invasion. There is narrowing and partial encasement of right upper lobe pulmonary arteries. 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 2. Small nonocclusive thrombus within subsegmental left lower lobe pulmonary artery. 3. 12 mm irregular right upper lobe pulmonary nodule which is either infectious, inflammatory, or neoplastic. Multifocal ill-defined consolidations in the right upper and middle lobes with diffuse ground-glass density in the right lower lobe, findings could be secondary to pneumonia, with consideration given to associated pulmonary infarcts given occluded appearance of right middle and lower lobe pulmonary arteries. 4. Numerous hypodense liver lesions, some of which are consistent with cysts. Given findings in the chest, recommend correlation  with nonemergent MRI. 5. Incompletely visualized age indeterminate fracture inferior endplate T12 6. Small pericardial effusion   09/01/2019 Imaging   Doppler: Summary: Right: Findings consistent with acute deep vein thrombosis involving the right posterior tibial veins. No cystic structure found in the popliteal fossa. Left: Findings consistent with acute deep vein thrombosis involving the left popliteal vein. No cystic structure found in the popliteal fossa.   09/02/2019 Imaging   MRI brain w/ contrast:IMPRESSION: Negative for metastatic disease to the brain.  No acute abnormality.   09/02/2019 Pathology Results   FINAL MICROSCOPIC DIAGNOSIS:   A. LUNG, RIGHT UPPER LOBE, BIOPSY:  - Benign bronchial epithelium.    09/02/2019 Pathology Results   Clinical History: Transbronchial brushings, RUL  Specimen Submitted:  A. LUNG, RIGHT UPPER LOBE, BRUSHINGS:    FINAL MICROSCOPIC DIAGNOSIS:  - Malignant cells consistent with squamous cell carcinoma    09/02/2019 Imaging   Echocardiogram: IMPRESSIONS      1. Left ventricular ejection fraction, by visual estimation, is 60 to 65%. The left ventricle has normal function. There is mildly increased left ventricular hypertrophy.  2. Left ventricular diastolic Doppler parameters are consistent with impaired relaxation pattern of LV diastolic filling.  3. Global right ventricle has mildly reduced systolic function.The right ventricular size is normal. No increase in right  ventricular wall thickness.  4. Left atrial size was normal.  5. Right atrial size was normal.  6. The mitral valve is grossly normal. Trace mitral valve regurgitation.  7. The tricuspid valve is grossly normal. Tricuspid valve regurgitation was not visualized by color flow Doppler.  8. The aortic valve is tricuspid Aortic valve regurgitation was not visualized by color flow Doppler.  9. The pulmonic valve was grossly normal. Pulmonic valve regurgitation is not visualized  by color flow Doppler. 10. The inferior vena cava is normal in size with <50% respiratory variability, suggesting right atrial pressure of 8 mmHg. 11. Small pericardial effusion. 12. The pericardial effusion is RV - inferior wall.   09/02/2019 Imaging   MRI brain: IMPRESSION: Negative for metastatic disease to the brain.  No acute abnormality.    09/03/2019 Imaging   MRI abdomen: IMPRESSION: 1. Severely motion degraded MRI, substantially limiting evaluation. 2. Enhancing L3 vertebral and medial right iliac T2 hypointense bone lesions, concerning for bone metastases. PET-CT strongly recommended on a short term outpatient basis for further characterization. 3. Numerous T2 hyperintense lesions scattered throughout the liver, without definite enhancement on the severely motion degraded postcontrast sequences, presumably benign liver cysts. The liver can be better staged on PET-CT.   09/21/2019 Imaging   PET: IMPRESSION: 5.4 cm medial right upper lobe mass, corresponding to the patient's known primary bronchogenic neoplasm. Associated direct extension to the right perihilar region and left atrium.   Associated right perihilar and low right paratracheal nodal metastases.   Multifocal right lung opacities, likely reflecting a combination of tumor and postobstructive opacity/infection. Trace right pleural effusion.   Osseous metastases involving the L3 vertebral body, right iliac bone, and left femoral head/neck.   Otherwise, no evidence of metastatic disease in the abdomen/pelvis.     I have reviewed the past medical history, past surgical history, social history and family history with the patient and they are unchanged from previous note.  ALLERGIES:  has No Known Allergies.  MEDICATIONS:  Current Outpatient Medications  Medication Sig Dispense Refill  . albuterol (VENTOLIN HFA) 108 (90 Base) MCG/ACT inhaler Inhale 2 puffs into the lungs every 6 (six) hours as needed for  wheezing or shortness of breath. 18 g 2  . baclofen (LIORESAL) 10 MG tablet TAKE 1 TABLET BY MOUTH THREE TIMES A DAY AS NEEDED FOR MUSCLE SPASMS (Patient taking differently: Take 10 mg by mouth 3 (three) times daily. ) 270 tablet 1  . benzonatate (TESSALON) 100 MG capsule Take 1 capsule (100 mg total) by mouth 3 (three) times daily as needed for cough. 60 capsule 2  . buPROPion (WELLBUTRIN XL) 150 MG 24 hr tablet Take 1 tablet (150 mg total) by mouth daily.    . chlorhexidine (PERIDEX) 0.12 % solution Rinse with 15 mls twice daily for 30 seconds. Use after breakfast and at bedtime. Spit out excess. Do not swallow. 480 mL prn  . citalopram (CELEXA) 40 MG tablet Take 1 tablet (40 mg total) by mouth daily. 90 tablet 1  . gabapentin (NEURONTIN) 300 MG capsule Take 2 capsules (600 mg total) by mouth 3 (three) times daily. 180 capsule 1  . guaiFENesin-dextromethorphan (ROBITUSSIN DM) 100-10 MG/5ML syrup Take 5 mLs by mouth every 4 (four) hours as needed for cough. 120 mL 1  . HYDROcodone-acetaminophen (NORCO) 5-325 MG tablet Take 1 tablet by mouth every 8 (eight) hours as needed for moderate pain. 60 tablet 0  . metoprolol tartrate (LOPRESSOR) 50 MG tablet Take 1 tablet (50 mg  total) by mouth 2 (two) times daily. 180 tablet 1  . nicotine (NICODERM CQ - DOSED IN MG/24 HOURS) 14 mg/24hr patch Place 1 patch (14 mg total) onto the skin daily. 28 patch 1  . ondansetron (ZOFRAN) 8 MG tablet Take 1 tablet (8 mg total) by mouth every 8 (eight) hours as needed for nausea or vomiting. 40 tablet 2  . osimertinib mesylate (TAGRISSO) 80 MG tablet Take 1 tablet (80 mg total) by mouth daily. 30 tablet 11  . prochlorperazine (COMPAZINE) 10 MG tablet Take 1 tablet (10 mg total) by mouth every 8 (eight) hours as needed for nausea or vomiting. 40 tablet 2  . rivaroxaban (XARELTO) 20 MG TABS tablet Take 1 tablet (20 mg total) by mouth daily with supper. Please start this after you have completed the starter pack. 30 tablet 1   . Vitamin D, Ergocalciferol, (DRISDOL) 1.25 MG (50000 UT) CAPS capsule TAKE 1 CAPSULE (50,000 UNITS TOTAL) BY MOUTH EVERY 7 (SEVEN) DAYS. 12 capsule 0   No current facility-administered medications for this visit.     PHYSICAL EXAMINATION: ECOG PERFORMANCE STATUS: 2 - Symptomatic, <50% confined to bed  Today's Vitals   10/18/19 1202 10/18/19 1204  BP: (!) 117/55   Pulse: 100   Resp: 19   Temp: (!) 97.1 F (36.2 C)   TempSrc: Temporal   SpO2: 100%   Weight: 157 lb 6.4 oz (71.4 kg)   Height: _0  (1.626 m)   PainSc: 0-No pain 0-No pain   Body mass index is 27.02 kg/m.  Filed Weights   10/18/19 1202  Weight: 157 lb 6.4 oz (71.4 kg)    GENERAL: alert, no distress, sitting in a wheelchair comfortably, older than stated age SKIN: skin color, texture, turgor are normal, no rashes or significant lesions EYES: conjunctiva are pink and non-injected, sclera clear OROPHARYNX: no exudate, no erythema; lips, buccal mucosa, and tongue normal  NECK: supple, non-tender LUNGS: clear to auscultation with normal breathing effort HEART: regular rate & rhythm and no murmurs and trace bilateral lower extremity edema ABDOMEN: soft, non-tender, non-distended, normal bowel sounds Musculoskeletal: no cyanosis of digits and no clubbing  PSYCH: alert & oriented x 3, slightly slowed speech  LABORATORY DATA:  I have reviewed the data as listed    Component Value Date/Time   NA 140 10/18/2019 1108   K 4.8 10/18/2019 1108   CL 106 10/18/2019 1108   CO2 27 10/18/2019 1108   GLUCOSE 96 10/18/2019 1108   BUN 13 10/18/2019 1108   CREATININE 1.29 (H) 10/18/2019 1108   CALCIUM 9.5 10/18/2019 1108   PROT 8.0 10/18/2019 1108   ALBUMIN 4.2 10/18/2019 1108   AST 15 10/18/2019 1108   ALT 16 10/18/2019 1108   ALKPHOS 54 10/18/2019 1108   BILITOT 0.3 10/18/2019 1108   GFRNONAA 44 (L) 10/18/2019 1108   GFRAA 51 (L) 10/18/2019 1108    No results found for: SPEP, UPEP  Lab Results  Component Value  Date   WBC 8.1 10/18/2019   NEUTROABS 5.4 10/18/2019   HGB 8.1 (L) 10/18/2019   HCT 26.6 (L) 10/18/2019   MCV 89.3 10/18/2019   PLT 295 10/18/2019      Chemistry      Component Value Date/Time   NA 140 10/18/2019 1108   K 4.8 10/18/2019 1108   CL 106 10/18/2019 1108   CO2 27 10/18/2019 1108   BUN 13 10/18/2019 1108   CREATININE 1.29 (H) 10/18/2019 1108      Component  Value Date/Time   CALCIUM 9.5 10/18/2019 1108   ALKPHOS 54 10/18/2019 1108   AST 15 10/18/2019 1108   ALT 16 10/18/2019 1108   BILITOT 0.3 10/18/2019 1108       RADIOGRAPHIC STUDIES: I have personally reviewed the radiological images as listed below and agreed with the findings in the report. Nm Pet Image Initial (pi) Skull Base To Thigh  Result Date: 09/21/2019 CLINICAL DATA:  Initial treatment strategy for non-small cell lung cancer. EXAM: NUCLEAR MEDICINE PET SKULL BASE TO THIGH TECHNIQUE: 7.7 mCi F-18 FDG was injected intravenously. Full-ring PET imaging was performed from the skull base to thigh after the radiotracer. CT data was obtained and used for attenuation correction and anatomic localization. Fasting blood glucose: 92 mg/dl COMPARISON:  MRI abdomen dated 09/03/2019. CT chest dated 08/31/2019. FINDINGS: Mediastinal blood pool activity: SUV max 2.8 Liver activity: SUV max NA NECK: No hypermetabolic cervical lymphadenopathy. 12 mm right thyroid nodule (series 4/image 46), max SUV 4.1. This is of questionable clinical significance given the additional findings. Incidental CT findings: none CHEST: 4.2 x 5.4 cm medial right upper lobe mass extending to the right perihilar region (series 8/image 36), likely reflecting a combination of neoplasm and postobstructive opacity, max SUV 15.4. 1.7 cm short axis right perihilar node, max SUV 13.2. Additional 9 mm short axis low right paratracheal node with hypermetabolism (series 4/image 64). Suspected direct extension of tumor into the left atrium also demonstrates  hypermetabolism on PET (PET image 70). Additional patchy/nodular opacities in the right upper lobe (series 8/image 23) with max SUV 5.8, lateral right middle lobe (series 8/image 40) with max SUV 13.2, and posterior right lower lobe (series 8/image 85) with max SUV 5.7. This may reflect a combination of tumor and postobstructive opacity/infection. Trace right pleural effusion.  Left lung is clear. Incidental CT findings: Trace pericardial effusion. Atherosclerotic calcifications of the aortic arch. Mild coronary atherosclerosis the LAD and right coronary artery. ABDOMEN/PELVIS: No abnormal hypermetabolism in the liver, spleen, pancreas, or adrenal glands. Scattered hepatic cysts. No hypermetabolic lymphadenopathy in the abdomen/pelvis. Incidental CT findings: Right renal atrophy. Atherosclerotic calcifications abdominal aorta and branch vessels. Status post hysterectomy. SKELETON: Focal hypermetabolism in the L3 vertebral body (PET image 124), max SUV 7.9. Focal hypermetabolism in the right iliac bone, with associated mild sclerosis (series 4/image 152), max SUV 7.3. Mild focal hypermetabolism at the junction of the left femoral head/neck (PET image 172), max SUV 4.3. Incidental CT findings: Degenerative changes of the visualized thoracolumbar spine. IMPRESSION: 5.4 cm medial right upper lobe mass, corresponding to the patient's known primary bronchogenic neoplasm. Associated direct extension to the right perihilar region and left atrium. Associated right perihilar and low right paratracheal nodal metastases. Multifocal right lung opacities, likely reflecting a combination of tumor and postobstructive opacity/infection. Trace right pleural effusion. Osseous metastases involving the L3 vertebral body, right iliac bone, and left femoral head/neck. Otherwise, no evidence of metastatic disease in the abdomen/pelvis. Electronically Signed   By: Julian Hy M.D.   On: 09/21/2019 11:30

## 2019-10-18 NOTE — Patient Instructions (Addendum)
It was good to talk with you today.  Your overnight oximetry test showed that you have night time oxygen desaturations This is when your oxygen levels drop when you sleep.  We will order oxygen to be worn at bedtime We will place an order for all appropriate equipment Wear oxygen at 2 L [place] at bedtime This should make you feel better. We will need to order a Home Sleep Study to evaluate for sleep apnea. Someone will call you to schedule Follow up video visit 10/27/2019 at 2 pm with Judson Roch NP to ensure you are managing and to evaluate benefit of therapy Follow up appointment as scheduled with PFT's 12/01/2019 Please contact office for sooner follow up if symptoms do not improve or worsen or seek emergency care

## 2019-10-19 ENCOUNTER — Telehealth: Payer: Self-pay | Admitting: Acute Care

## 2019-10-19 DIAGNOSIS — G4733 Obstructive sleep apnea (adult) (pediatric): Secondary | ICD-10-CM

## 2019-10-19 NOTE — Telephone Encounter (Signed)
Response from [employer] for CPAP Order:  Mariann Barter sent to Vicente Masson; [place]; [place], [place]; [place], Samuel [place]; 1 other        I have been on hold to leave staff message for 9 minutes now. Unfortunately, I cannot continue to hold.   Please let Dr Elie Confer know that because this patient had OSA and Medicare, and nocturnal o2 is being ordered. She will have to have a titrated sleep study to qualify her for nocturnal o2. See below for insurance requirements   FOR PATIENT'S WITH SLEEP APNEA  For patients with sleep apnea, a qualifying oxygen saturation test may only occur during a titration polysomnographic study, AKA "sleep study". The titration sleep study is one in which all of the following criteria are met:  Marland Kitchen The titration is conducted over a minimum of two hours; and  . During the titration:  . The AHI/RDI is reduced to less than or equal to an average of ten events per hour; or  . If the initial AHI/RDI was less than an average of ten events per hour, the titration  demonstrates further reduction in the AHI/RDI; and  . Nocturnal oximetry conducted for the purpose of oxygen qualification may ONLY be  performed after the optimal PAP settings have been determined AND the patient is using  the PAP device at those settings; and  . The nocturnal oximetry conducted during the PSG (sleep study) demonstrates an oxygen saturation of &#8804;88% for 5 minutes total (does not need to be continuous)   Please advise when she has the titrated sleep study done and you get the results. We can process the order once we see the titrated sleep study results.   Thank you

## 2019-10-19 NOTE — Telephone Encounter (Signed)
SG pt needs a titration study since she showed OSA on the ONO. Can we order if pt agrees. Please advise.

## 2019-10-19 NOTE — Telephone Encounter (Signed)
Please order titration study if patient agrees. Thanks so much

## 2019-10-20 NOTE — Telephone Encounter (Signed)
Spoke to pt's daughter and advised her that pt needs a titration study done. She agreed and will advise pt. I placed order. Nothing further is needed.

## 2019-10-20 NOTE — Addendum Note (Signed)
Addended by: Jannette Spanner on: 10/20/2019 09:09 AM   Modules accepted: Orders

## 2019-10-25 ENCOUNTER — Emergency Department (HOSPITAL_COMMUNITY): Payer: Medicare Other

## 2019-10-25 ENCOUNTER — Other Ambulatory Visit: Payer: Self-pay

## 2019-10-25 ENCOUNTER — Emergency Department (HOSPITAL_COMMUNITY)
Admission: EM | Admit: 2019-10-25 | Discharge: 2019-10-25 | Disposition: A | Discharge status: Home / Self Care | Payer: Medicare Other | Attending: Emergency Medicine | Admitting: Emergency Medicine

## 2019-10-25 DIAGNOSIS — I129 Hypertensive chronic kidney disease with stage 1 through stage 4 chronic kidney disease, or unspecified chronic kidney disease: Secondary | ICD-10-CM | POA: Diagnosis not present

## 2019-10-25 DIAGNOSIS — N183 Chronic kidney disease, stage 3 unspecified: Secondary | ICD-10-CM | POA: Insufficient documentation

## 2019-10-25 DIAGNOSIS — Y929 Unspecified place or not applicable: Secondary | ICD-10-CM | POA: Insufficient documentation

## 2019-10-25 DIAGNOSIS — S99921A Unspecified injury of right foot, initial encounter: Secondary | ICD-10-CM | POA: Diagnosis present

## 2019-10-25 DIAGNOSIS — S92324A Nondisplaced fracture of second metatarsal bone, right foot, initial encounter for closed fracture: Secondary | ICD-10-CM | POA: Diagnosis not present

## 2019-10-25 DIAGNOSIS — Z87891 Personal history of nicotine dependence: Secondary | ICD-10-CM | POA: Diagnosis not present

## 2019-10-25 DIAGNOSIS — Y939 Activity, unspecified: Secondary | ICD-10-CM | POA: Diagnosis not present

## 2019-10-25 DIAGNOSIS — S92301A Fracture of unspecified metatarsal bone(s), right foot, initial encounter for closed fracture: Secondary | ICD-10-CM

## 2019-10-25 DIAGNOSIS — Y999 Unspecified external cause status: Secondary | ICD-10-CM | POA: Diagnosis not present

## 2019-10-25 DIAGNOSIS — Z79899 Other long term (current) drug therapy: Secondary | ICD-10-CM | POA: Insufficient documentation

## 2019-10-25 DIAGNOSIS — S92334A Nondisplaced fracture of third metatarsal bone, right foot, initial encounter for closed fracture: Secondary | ICD-10-CM | POA: Diagnosis not present

## 2019-10-25 DIAGNOSIS — W010XXA Fall on same level from slipping, tripping and stumbling without subsequent striking against object, initial encounter: Secondary | ICD-10-CM | POA: Diagnosis not present

## 2019-10-25 MED ORDER — HYDROCODONE-ACETAMINOPHEN 5-325 MG PO TABS
1.0000 | ORAL_TABLET | Freq: Once | ORAL | Status: AC
  Administered 2019-10-25: 1 via ORAL
  Filled 2019-10-25: qty 1

## 2019-10-25 MED ORDER — HYDROCODONE-ACETAMINOPHEN 5-325 MG PO TABS: 1.0000 | ORAL_TABLET | Freq: Four times a day (QID) | ORAL | 0 refills | Status: DC | PRN

## 2019-10-25 NOTE — ED Triage Notes (Signed)
Pt states that she fell in her bathroom yesterday, injuring her right foot. Pt's foot is swollen.

## 2019-10-25 NOTE — Discharge Instructions (Signed)
You can take 1000 mg of Tylenol.  Do not exceed 4000 mg of Tylenol a day.  Take pain medications as directed for break through pain. Do not drive or operate machinery while taking this medication.   Wear cam walker boot for support and stabilization.  You can use your walker or cane.  Follow-up with referred orthopedic doctor.  Return emergency department for any worsening pain, numbness/weakness, discoloration of the foot or any other worsening concerning symptoms.

## 2019-10-25 NOTE — ED Provider Notes (Signed)
[facility] DEPT Provider Note   CSN: [id number] Arrival date & time: 10/25/19  1038     History Chief Complaint  Patient presents with  . Foot Pain    Brianna Saunders is a 63 y.o. female has no history of hypertension who presents for evaluation of left foot pain after mechanical fall that occurred about 11 AM yesterday morning.  Patient reports that she normally ambulates with the assistance of a walker.  She states that she was attempting in the bathroom and states that her walker got caught up and her foot got tangled causing her to fall.  She states her foot bent backwards.  Since then, she has had pain to the dorsal aspect of left foot as well as soft tissue swelling.  She has been taking Tylenol with minimal improvement in her symptoms.  She has had difficulty ambulating bearing weight on that foot secondary to pain since the incident.  She denies any numbness/weakness.  Denies any head injury, LOC.  The history is provided by the patient.       Past Medical History:  Diagnosis Date  . Chicken pox   . Hypertension     Patient Active Problem List   Diagnosis Date Noted  . Goals of care, counseling/discussion 09/23/2019  . Squamous cell carcinoma of lung, right ([place]) 08/31/2019  . Pulmonary embolism ([place]) 08/31/2019  . Normocytic anemia 08/31/2019  . CKD (chronic kidney disease), stage III 08/31/2019  . Liver lesion 08/31/2019  . Left foot drop 07/09/2019  . Vitamin D deficiency 04/08/2019  . Cervical spinal stenosis 08/27/2018  . Difficulty walking 01/28/2018  . Current smoker 12/18/2017  . Hypertension 11/27/2017  . Chronic pain 11/27/2017  . Major depression in full remission [facility])     Past Surgical History:  Procedure Laterality Date  . FRACTURE SURGERY    . PARTIAL HYSTERECTOMY    . SPINE SURGERY     lumbar spine- around 2014  . upper back surgery     states plate in neck and into right shoulder blade reportedly-  around 2014  . VIDEO BRONCHOSCOPY Bilateral 09/02/2019   Procedure: VIDEO BRONCHOSCOPY WITH FLUORO;  Surgeon: Collene Gobble, MD;  Location: [facility];  Service: Cardiopulmonary;  Laterality: Bilateral;     OB History   No obstetric history on file.     Family History  Problem Relation Age of Onset  . Hypertension Mother   . Heart attack Mother        69  . Hypertension Father   . Kidney disease Father        dialysis  . Kidney disease Brother        dialysis  . Heart attack Maternal Grandmother   . Early death Sister        died at 53 months    Social History   Tobacco Use  . Smoking status: Former Smoker    Packs/day: 0.50    Years: 45.00    Pack years: 22.50    Types: Cigarettes    Start date: 11/11/1969    Quit date: 09/01/2019    Years since quitting: 0.1  . Smokeless tobacco: Never Used  . Tobacco comment: 2 cigarettes a day  Substance Use Topics  . Alcohol use: No  . Drug use: No    Home Medications Prior to Admission medications   Medication Sig Start Date End Date Taking? Authorizing Provider  albuterol (VENTOLIN HFA) 108 (90 Base) MCG/ACT inhaler Inhale 2 puffs  into the lungs every 6 (six) hours as needed for wheezing or shortness of breath. 09/29/19   [name], NP  baclofen (LIORESAL) 10 MG tablet TAKE 1 TABLET BY MOUTH THREE TIMES A DAY AS NEEDED FOR MUSCLE SPASMS Patient taking differently: Take 10 mg by mouth 3 (three) times daily.  03/16/19   Marin Olp, MD  benzonatate (TESSALON) 100 MG capsule Take 1 capsule (100 mg total) by mouth 3 (three) times daily as needed for cough. 09/10/19   [name], MD  buPROPion (WELLBUTRIN XL) 150 MG 24 hr tablet Take 1 tablet (150 mg total) by mouth daily. 09/04/19   Rai, Vernelle Emerald, MD  chlorhexidine (PERIDEX) 0.12 % solution Rinse with 15 mls twice daily for 30 seconds. Use after breakfast and at bedtime. Spit out excess. Do not swallow. 09/28/19   Lenn Cal, DDS  citalopram (CELEXA) 40 MG  tablet Take 1 tablet (40 mg total) by mouth daily. 03/16/19   Marin Olp, MD  gabapentin (NEURONTIN) 300 MG capsule Take 2 capsules (600 mg total) by mouth 3 (three) times daily. 09/04/19   Rai, Ripudeep K, MD  guaiFENesin-dextromethorphan (ROBITUSSIN DM) 100-10 MG/5ML syrup Take 5 mLs by mouth every 4 (four) hours as needed for cough. 09/10/19   [name], MD  HYDROcodone-acetaminophen (NORCO/VICODIN) 5-325 MG tablet Take 1-2 tablets by mouth every 6 (six) hours as needed. 10/25/19   Volanda Napoleon, PA-C  metoprolol tartrate (LOPRESSOR) 50 MG tablet Take 1 tablet (50 mg total) by mouth 2 (two) times daily. 03/16/19   Marin Olp, MD  nicotine (NICODERM CQ - DOSED IN MG/24 HOURS) 14 mg/24hr patch Place 1 patch (14 mg total) onto the skin daily. 09/05/19   Rai, Ripudeep K, MD  ondansetron (ZOFRAN) 8 MG tablet Take 1 tablet (8 mg total) by mouth every 8 (eight) hours as needed for nausea or vomiting. 10/05/19   [name], MD  prochlorperazine (COMPAZINE) 10 MG tablet Take 1 tablet (10 mg total) by mouth every 8 (eight) hours as needed for nausea or vomiting. 10/05/19   [name], MD  rivaroxaban (XARELTO) 20 MG TABS tablet Take 1 tablet (20 mg total) by mouth daily with supper. Please start this after you have completed the starter pack. 10/05/19   [name], NP  Vitamin D, Ergocalciferol, (DRISDOL) 1.25 MG (50000 UT) CAPS capsule TAKE 1 CAPSULE (50,000 UNITS TOTAL) BY MOUTH EVERY 7 (SEVEN) DAYS. 08/25/19   Marin Olp, MD    Allergies    Patient has no known allergies.  Review of Systems   Review of Systems  Musculoskeletal:       Left foot pain  Neurological: Negative for weakness and numbness.  All other systems reviewed and are negative.   Physical Exam Updated Vital Signs BP (!) 132/94   Pulse 98   Temp 97.9 F (36.6 C) (Oral)   Resp 16   Ht 5\' 4"  (1.626 m)   Wt 69.9 kg   SpO2 98%   BMI 26.43 kg/m   Physical Exam Vitals and nursing note reviewed.   Constitutional:      Appearance: She is well-developed.  HENT:     Head: Normocephalic and atraumatic.  Eyes:     General: No scleral icterus.       Right eye: No discharge.        Left eye: No discharge.     Conjunctiva/sclera: Conjunctivae normal.  Cardiovascular:     Pulses:  Dorsalis pedis pulses are 2+ on the right side and 2+ on the left side.  Pulmonary:     Effort: Pulmonary effort is normal.  Musculoskeletal:       Legs:     Comments: Tenderness palpation of dorsal aspect of left foot with overlying soft tissue swelling or crepitus noted.  She can wiggle all 5 toes of foot with any difficulty.  No tenderness palpation noted to the lateral medial malleolus.  No tenderness palpation noted to left tib-fib, left knee, left femur.  No tenderness palpation noted to right lower extremity.  Skin:    General: Skin is warm and dry.     Capillary Refill: Capillary refill takes less than 2 seconds.     Comments: Good distal cap refill.  RLE is not dusky in appearance or cool to touch.  Neurological:     Mental Status: She is alert.     Comments: Sensation intact along major nerve distributions of BUE  Psychiatric:        Speech: Speech normal.        Behavior: Behavior normal.     ED Results / Procedures / Treatments   Labs (all labs ordered are listed, but only abnormal results are displayed) Labs Reviewed - No data to display  EKG None  Radiology DG Foot Complete Left  Result Date: 10/25/2019 CLINICAL DATA:  Right foot pain and swelling following a fall in her bathroom yesterday. EXAM: LEFT FOOT - COMPLETE 3+ VIEW COMPARISON:  None. FINDINGS: Essentially nondisplaced fractures of the bases of the 2nd and 3rd metatarsals without intra-articular extension. Possible nondisplaced fracture of the base of the 1st metatarsal extending into the articulation with the medial cuneiform. No other fractures and no dislocations seen. Small calcific densities at the base of the  4th toe. Mild calcaneal enthesophyte formation. Diffuse distal soft tissue swelling. IMPRESSION: Essentially nondisplaced fractures of the bases of the 2nd and 3rd metatarsals and possible nondisplaced fracture of the base of the 1st metatarsal. Electronically Signed   By: Claudie Revering M.D.   On: 10/25/2019 12:32    Procedures Procedures (including critical care time)  Medications Ordered in ED Medications  HYDROcodone-acetaminophen (NORCO/VICODIN) 5-325 MG per tablet 1 tablet (1 tablet Oral Given 10/25/19 1206)    ED Course  I have reviewed the triage vital signs and the nursing notes.  Pertinent labs & imaging results that were available during my care of the patient were reviewed by me and considered in my medical decision making (see chart for details).    MDM Rules/Calculators/A&P                      63 year old female who presents for evaluation of left foot pain status post mechanical fall that occurred yesterday.  Reports that she normally walks with the assistance of a walker.  Reports that her walker got tingled up which caused her to fall and bent her foot backwards. Patient is afebrile, non-toxic appearing, sitting comfortably on examination table. Vital signs reviewed and stable.  Patient is neurovascularly intact.  Patient with tenderness palpation dorsal aspect of left foot with overlying soft tissue swelling.  Plan for x-rays.  Concern for fracture versus dislocation versus sprain.  X-ray shows nondisplaced fractures of the bases of the second and third metatarsals and possible nondisplaced fracture of the base of the first metatarsal.  Discussed patient with Dr. Maureen Ralphs (Ortho).  Recommends placing patient in cam walker boot with plans to follow-up in outpatient  office this week.  Discussed results with patient.  She is agreeable to plan.  Cam walker boot given as well as short course of pain medication for acute pain.  She had a recent prescription for Norco on 10/05/2019.   Given acute findings, will plan to give her a short pain medication she is able to follow-up with orthopedics.  At this time, patient exhibits no emergent life-threatening condition that require further evaluation in ED or admission. Patient had ample opportunity for questions and discussion. All patient's questions were answered with full understanding. Strict return precautions discussed. Patient expresses understanding and agreement to plan.   Portions of this note were generated with [employer]. Dictation errors may occur despite best attempts at proofreading.   Final Clinical Impression(s) / ED Diagnoses Final diagnoses:  Closed nondisplaced fracture of metatarsal bone of right foot, unspecified metatarsal, initial encounter    Rx / DC Orders ED Discharge Orders         Ordered    HYDROcodone-acetaminophen (NORCO/VICODIN) 5-325 MG tablet  Every 6 hours PRN     10/25/19 1311           Desma Mcgregor 10/25/19 1330    Milton Ferguson, MD 10/26/19 1027

## 2019-10-27 ENCOUNTER — Encounter: Payer: Self-pay | Admitting: Acute Care

## 2019-10-27 ENCOUNTER — Telehealth (INDEPENDENT_AMBULATORY_CARE_PROVIDER_SITE_OTHER): Payer: Medicare Other | Admitting: Acute Care

## 2019-10-27 DIAGNOSIS — G4734 Idiopathic sleep related nonobstructive alveolar hypoventilation: Secondary | ICD-10-CM | POA: Diagnosis not present

## 2019-10-27 DIAGNOSIS — C3490 Malignant neoplasm of unspecified part of unspecified bronchus or lung: Secondary | ICD-10-CM

## 2019-10-27 NOTE — Progress Notes (Signed)
Virtual Visit via Video Note  I connected with Brianna Saunders on 10/27/19 at  2:00 PM EST by a video enabled telemedicine application and verified that I am speaking with the correct person using two identifiers.  Location: Patient: At home Provider: In the office    I discussed the limitations of evaluation and management by telemedicine and the availability of in person appointments. The patient expressed understanding and agreed to proceed.  Synopsis 63 year old woman with a history of hypertension, depression, neuropathy. She is a smoker (approximately 25 pack years).She presented to the ED10/20/2020 with several months of progressive dyspnea and a nonproductive cough, worse over the last several days. No reported sick contacts. CT-PA 10/21 reviewed, shows small left lower lobe PA pulmonary embolism,anda large partially cavitary right upper lobe irregularly shaped mass that extends into the hilar region, encases the right upper lobe PA and appears to extend is a soft tissue lesion into the LA. There is a small pericardial effusion. 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 Staging isStage IV ([id number]).She is currently being followed by oncology. She is being treated with Xarelto for her PE, andosimertinib 66m dailyfor her lung cancer. She understands that this is palliative treatment. She has quit smoking completely.  GJonn ShinglesJamesis a 63y.o.femalecurrent every day smokerwithnew diagnosis ofStage IV (([id number] squamous cell carcinoma of the R upper lung, EGFR exon 19 deletion. She is followed by Dr. BLamonte Sakai History of Present Illness: Pt. Presents today for discussion of results of nocturnal sleep study. Sleep study showed nocturnal  desaturations, She qualified for nocturnal oxygen Medicare Group 1 Criteria, but the study was also very concerning for sleep apnea. She is set up for a 11/14/2018 sleep study at the hospital. We will evaluate and then order the appropriate therapy. She verbalized understanding and agreed to proceed with the polysomnogram 11/14/2018. She had no other complaints or issues Her daughter was also linked into the video call and agreed with the plan . Pt. Is otherwise stable and doing well.     Observations/Objective: 10/14/2019: Overnight Oximetry>> Time Sats < or = to 88% : 52.9 minutes  Time < or = to 89%: 64.9 minutes Qualifies for nocturnal oxygen therapy based on the Medicare Group 1 Criteria. Saturation Nadir 75% Time consecutive < or = to 88% : 5.6 minutes ODI >> 256 events, or 35 events per hour. >> Needs home sleep study   CT chest 10/21>>small left lower lobe PA pulmonary embolism, a large partially cavitary right upper lobe irregularly shaped mass that extends into the hilar region, encases the right upper lobe PA and appears to extend is a soft tissue lesion into the LA. There is a small pericardial effusion.  Bronchoscopy with transbronchial brushings 10/22>>squamous cell lung cancer  09/02/19 CXR Stable appearing right lung mass with hilar adenopathy. No post bronchoscopy pneumothorax is seen.  11/10 PET 5.4 cm medial right upper lobe mass, corresponding to the patient's known primary bronchogenic neoplasm. Associated direct extension to the right perihilar region and left atrium.  Associated right perihilar and low right paratracheal nodal metastases.  Multifocal right lung opacities, likely reflecting a combination of tumor and postobstructive opacity/infection. Trace right pleural effusion.  Osseous metastases involving the L3 vertebral body, right iliac bone, and left femoral head/neck.  Otherwise, no evidence of metastatic disease in the  abdomen/pelvis.  Physical Exam:As able per video  visit  General- No distress,  A&Ox3, appropriate ENT: NCAT no oral exudate,no post nasal drip Cardiac: Unable via video Chest: Bilateral chest excursion, no nasal flaring, no sternal retractions, no accessory muscle use Abd.: Unable via video Ext: No clubbing cyanosis, edema per upper extremities Neuro:  normal strength, MAE x 4. A&O x 3 Skin: No rashes, no lesions observed Psych: normal mood and behavior   Assessment and Plan: Nocturnal oxygen desaturations 52 minutes and 56 seconds of 88% Qualifies for nocturnal oxygen therapy based on the Medicare Group 1 Criteria. Per DME needs a titration study and polysomnogram Plan Go for Polysomnogram as is scheduled for 11/14/2018 We will call you with results We will order the appropriate therapy based on results Follow up once we have results and therapy recommendations  Lung Cancer  Plan Follow up with radiation oncology    Follow Up Instructions: Follow up after Sleep study in January 2021 Please contact office for sooner follow up if symptoms do not improve or worsen or seek emergency care    I discussed the assessment and treatment plan with the patient. The patient was provided an opportunity to ask questions and all were answered. The patient agreed with the plan and demonstrated an understanding of the instructions.   The patient was advised to call back or seek an in-person evaluation if the symptoms worsen or if the condition fails to improve as anticipated.  I provided 25 minutes of non-face-to-face time during this encounter.   [name], NP 10/27/2019 2:29 PM

## 2019-10-27 NOTE — Patient Instructions (Signed)
It was good to see you today by video Go for Polysomnogram as is scheduled for 11/14/2018 We will call you with results We will order the appropriate therapy based on results Follow up once we have results and therapy recommendations Have a happy holiday Call if you need [place].    Follow Up Instructions: Follow up after Sleep study in January 2021 with Amberia Bayless NP Please contact office for sooner follow up if symptoms do not improve or worsen or seek emergency care

## 2019-11-03 ENCOUNTER — Encounter: Payer: Self-pay | Admitting: *Deleted

## 2019-11-09 ENCOUNTER — Other Ambulatory Visit: Payer: Self-pay | Admitting: *Deleted

## 2019-11-09 ENCOUNTER — Inpatient Hospital Stay: Payer: Medicare Other

## 2019-11-09 ENCOUNTER — Encounter: Payer: Self-pay | Admitting: Hematology

## 2019-11-09 ENCOUNTER — Other Ambulatory Visit: Payer: Self-pay

## 2019-11-09 ENCOUNTER — Inpatient Hospital Stay (HOSPITAL_BASED_OUTPATIENT_CLINIC_OR_DEPARTMENT_OTHER): Payer: Medicare Other | Admitting: Hematology

## 2019-11-09 VITALS — BP 115/67 | HR 103 | Temp 97.7°F | Resp 18

## 2019-11-09 DIAGNOSIS — C3491 Malignant neoplasm of unspecified part of right bronchus or lung: Secondary | ICD-10-CM | POA: Diagnosis not present

## 2019-11-09 DIAGNOSIS — D631 Anemia in chronic kidney disease: Secondary | ICD-10-CM | POA: Diagnosis not present

## 2019-11-09 DIAGNOSIS — I2693 Single subsegmental pulmonary embolism without acute cor pulmonale: Secondary | ICD-10-CM | POA: Diagnosis not present

## 2019-11-09 DIAGNOSIS — C7951 Secondary malignant neoplasm of bone: Secondary | ICD-10-CM | POA: Diagnosis not present

## 2019-11-09 DIAGNOSIS — N183 Chronic kidney disease, stage 3 unspecified: Secondary | ICD-10-CM | POA: Diagnosis not present

## 2019-11-09 DIAGNOSIS — D649 Anemia, unspecified: Secondary | ICD-10-CM | POA: Diagnosis not present

## 2019-11-09 DIAGNOSIS — E611 Iron deficiency: Secondary | ICD-10-CM | POA: Diagnosis not present

## 2019-11-09 DIAGNOSIS — R630 Anorexia: Secondary | ICD-10-CM

## 2019-11-09 DIAGNOSIS — Z79899 Other long term (current) drug therapy: Secondary | ICD-10-CM | POA: Diagnosis not present

## 2019-11-09 DIAGNOSIS — N1831 Chronic kidney disease, stage 3a: Secondary | ICD-10-CM

## 2019-11-09 DIAGNOSIS — C3411 Malignant neoplasm of upper lobe, right bronchus or lung: Secondary | ICD-10-CM | POA: Diagnosis not present

## 2019-11-09 LAB — CBC WITH DIFFERENTIAL (CANCER CENTER ONLY)
Abs Immature Granulocytes: 0.03 10*3/uL (ref 0.00–0.07)
Basophils Absolute: 0 10*3/uL (ref 0.0–0.1)
Basophils Relative: 0 %
Eosinophils Absolute: 0.2 10*3/uL (ref 0.0–0.5)
Eosinophils Relative: 3 %
HCT: 23.1 % — ABNORMAL LOW (ref 36.0–46.0)
Hemoglobin: 7 g/dL — ABNORMAL LOW (ref 12.0–15.0)
Immature Granulocytes: 0 %
Lymphocytes Relative: 15 %
Lymphs Abs: 1.4 10*3/uL (ref 0.7–4.0)
MCH: 28.3 pg (ref 26.0–34.0)
MCHC: 30.3 g/dL (ref 30.0–36.0)
MCV: 93.5 fL (ref 80.0–100.0)
Monocytes Absolute: 0.5 10*3/uL (ref 0.1–1.0)
Monocytes Relative: 5 %
Neutro Abs: 7.1 10*3/uL (ref 1.7–7.7)
Neutrophils Relative %: 77 %
Platelet Count: 321 10*3/uL (ref 150–400)
RBC: 2.47 MIL/uL — ABNORMAL LOW (ref 3.87–5.11)
RDW: 21.2 % — ABNORMAL HIGH (ref 11.5–15.5)
WBC Count: 9.3 10*3/uL (ref 4.0–10.5)
nRBC: 0 % (ref 0.0–0.2)

## 2019-11-09 LAB — CMP (CANCER CENTER ONLY)
ALT: 18 U/L (ref 0–44)
AST: 18 U/L (ref 15–41)
Albumin: 4 g/dL (ref 3.5–5.0)
Alkaline Phosphatase: 56 U/L (ref 38–126)
Anion gap: 7 (ref 5–15)
BUN: 24 mg/dL — ABNORMAL HIGH (ref 8–23)
CO2: 28 mmol/L (ref 22–32)
Calcium: 9.5 mg/dL (ref 8.9–10.3)
Chloride: 104 mmol/L (ref 98–111)
Creatinine: 1.25 mg/dL — ABNORMAL HIGH (ref 0.44–1.00)
GFR, Est AFR Am: 53 mL/min — ABNORMAL LOW (ref >60)
GFR, Estimated: 45 mL/min — ABNORMAL LOW (ref >60)
Glucose, Bld: 97 mg/dL (ref 70–99)
Potassium: 4.1 mmol/L (ref 3.5–5.1)
Sodium: 139 mmol/L (ref 135–145)
Total Bilirubin: 0.2 mg/dL — ABNORMAL LOW (ref 0.3–1.2)
Total Protein: 7.6 g/dL (ref 6.5–8.1)

## 2019-11-09 LAB — PREPARE RBC (CROSSMATCH)

## 2019-11-09 MED ORDER — HYDROCODONE-ACETAMINOPHEN 7.5-325 MG PO TABS: 1.0000 | ORAL_TABLET | Freq: Three times a day (TID) | ORAL | 0 refills | Status: DC | PRN

## 2019-11-09 MED ORDER — PREDNISONE 5 MG PO TABS: 5.0000 mg | ORAL_TABLET | Freq: Every day | ORAL | 3 refills | Status: DC

## 2019-11-09 NOTE — Progress Notes (Signed)
[facility] OFFICE PROGRESS NOTE  Patient Care Team: Hunter, Stephen O, MD as PCP - General (Family Medicine) , , MD as Medical Oncologist (Oncology) Saunders, Brianna E, RN as Oncology Nurse Navigator  HEME/ONC OVERVIEW: 1. 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 -Patient started osimertinib on 09/24/2019, and is tolerating it well so far -Continue osimertinib 80mg daily -I have ordered CT CAP prior to the next visit to assess interim disease response  -PRN anti-emetics: Zofran, Compazine  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  -Cleared by dentistry -Xgeva scheduled to start on 11/10/2019  -On Ca-Vit D supplement daily  -Due to pain not well controlled, I have increased Norco to 7.5/325, q8hrs PRN   Acute LLL PTE and bilateral DVT -Currently on Eliquis; patient denies any abnormal bleeding or bruising -Goal of anticoagulation is  lifelong -Continue Xarelto 20mg daily   Normocytic anemia -Most likely multifactorial, including anemia of chronic disease, CKD and treatment-related anemia  -S/p 2 doses of IV iron, last dose on 10/12/2019  -Hgb 7.0 today, lower than the last visit -I have ordered type and screen, and the patient will receive 2 units RBC on 11/10/2019 -In addition, as her anemia has not improved, I recommend starting Aranesp 500mcg q4weeks, concurrent with Xgeva injection -We discussed the risks, benefits, side effects of Aranesp, with the goal of keeping the hemoglobin greater than 10 g. -Some of the potential side effects include allergic reaction, skin rashes, headache, severe hypertension, and thrombosis, such as stroke and heart attack. There is a rare risk of causing growth of cancers. She agreed to proceed as outlined above.  Stage III CKD -Cr fluctuating between 1.2 and 1.4 since 2019  -Cr 1.25 today, stable; electrolytes normal -Patient was counseled to avoid NSAIDs  -We will monitor closely   Lack of appetite -Likely due to underlying malignancy -I have added prednisone 5mg daily -If no improvement, we can also consider adding mirtazapine   Orders Placed This Encounter  Procedures  . Practitioner attestation of consent    I, the ordering practitioner, attest that I have discussed with the patient the benefits, risks, side effects, alternatives, likelihood of achieving goals and potential problems during recovery for the procedure listed.    Standing Status:   Future    Standing Expiration Date:   11/08/2020    Order Specific Question:   Procedure    Answer:   Blood Product(s)  .   Complete patient signature process for consent form    Standing Status:   Future    Standing Expiration Date:   11/08/2020  . Care order/instruction    Transfuse Parameters    Standing Status:   Future    Number of Occurrences:   1    Standing Expiration Date:   11/08/2020  . Type and screen          Standing Status:   Future    Number of Occurrences:   1    Standing Expiration Date:   11/08/2020    All questions were answered. The patient knows to call the clinic with any problems, questions or concerns. No barriers to learning was detected.  Return in 1 month for labs, imaging results and clinic appt.   [name], MD 11/09/2019 3:39 PM  CHIEF COMPLAINT: "I need something for my appetite"  INTERVAL HISTORY: [name] returns to clinic for follow-up of metastatic squamous cell carcinoma of the lung on [place].  Patient reports that she has had persistent lack of appetite, and requests "something that will help me eat."  She is otherwise tolerating [place] relatively well, and the denies any constitutional symptoms, chest pain, dyspnea, abdominal pain, nausea, vomiting, diarrhea, or rash.  She recently fell and had a nondisplaced fracture of the bases of the second and third metatarsals of the left foot, and wears a boot.  She takes Norco 5/325 approximately twice a day, but the pain is not well controlled.  However, she has not been taking it more frequently due to sedation.  She denies any other complaint today.  REVIEW OF SYSTEMS:   Constitutional: ( - ) fevers, ( - )  chills , ( - ) night sweats Eyes: ( - ) blurriness of vision, ( - ) double vision, ( - ) watery eyes Ears, nose, mouth, throat, and face: ( - ) mucositis, ( - ) sore throat Respiratory: ( - ) cough, ( - ) dyspnea, ( - ) wheezes Cardiovascular: ( - ) palpitation, ( - ) chest discomfort, ( - ) lower extremity swelling Gastrointestinal:  ( - ) nausea, ( - ) heartburn, ( - ) change in bowel habits Skin: ( - ) abnormal skin rashes Lymphatics: ( - ) new lymphadenopathy, ( - ) easy bruising Neurological: ( - ) numbness, ( - ) tingling, ( - ) new weaknesses Behavioral/Psych: ( - ) mood change, ( - ) new changes  All other systems were reviewed with the patient and are negative.  SUMMARY OF ONCOLOGIC HISTORY: Oncology  History  Squamous cell carcinoma of lung, right ([place])  08/31/2019 Initial Diagnosis   Squamous cell carcinoma of lung, right ([place])   08/31/2019 Imaging   CT chest w/ contrast: IMPRESSION: 1. Large poorly defined partially cavitary mass in the right upper lobe measuring approximately 4.1 x 6.4 x 3.8 cm, contiguous with ill-defined mass and/or adenopathy within the mediastinum. 1.9 cm pedunculated filling defect within the right aspect of the left atrium, appears contiguous with mediastinal soft tissue density and is concerning for left atrial invasion. There is narrowing and partial encasement of right upper lobe pulmonary arteries. 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 2. Small nonocclusive thrombus within subsegmental left lower lobe pulmonary artery. 3. 12 mm irregular right  upper lobe pulmonary nodule which is either infectious, inflammatory, or neoplastic. Multifocal ill-defined consolidations in the right upper and middle lobes with diffuse ground-glass density in the right lower lobe, findings could be secondary to pneumonia, with consideration given to associated pulmonary infarcts given occluded appearance of right middle and lower lobe pulmonary arteries. 4. Numerous hypodense liver lesions, some of which are consistent with cysts. Given findings in the chest, recommend correlation with nonemergent MRI. 5. Incompletely visualized age indeterminate fracture inferior endplate T12 6. Small pericardial effusion   09/01/2019 Imaging   Doppler: Summary: Right: Findings consistent with acute deep vein thrombosis involving the right posterior tibial veins. No cystic structure found in the popliteal  fossa. Left: Findings consistent with acute deep vein thrombosis involving the left popliteal vein. No cystic structure found in the popliteal fossa.   09/02/2019 Imaging   MRI brain w/ contrast:IMPRESSION: Negative for metastatic disease to the brain.  No acute abnormality.   09/02/2019 Pathology Results   FINAL MICROSCOPIC DIAGNOSIS:   A. LUNG, RIGHT UPPER LOBE, BIOPSY:  - Benign bronchial epithelium.    09/02/2019 Pathology Results   Clinical History: Transbronchial brushings, RUL  Specimen Submitted:  A. LUNG, RIGHT UPPER LOBE, BRUSHINGS:    FINAL MICROSCOPIC DIAGNOSIS:  - Malignant cells consistent with squamous cell carcinoma    09/02/2019 Imaging   Echocardiogram: IMPRESSIONS      1. Left ventricular ejection fraction, by visual estimation, is 60 to 65%. The left ventricle has normal function. There is mildly increased left ventricular hypertrophy.  2. Left ventricular diastolic Doppler parameters are consistent with impaired relaxation pattern of LV diastolic filling.  3. Global right ventricle has mildly reduced systolic function.The right ventricular size is normal. No increase in right ventricular wall thickness.  4. Left atrial size was normal.  5. Right atrial size was normal.  6. The mitral valve is grossly normal. Trace mitral valve regurgitation.  7. The tricuspid valve is grossly normal. Tricuspid valve regurgitation was not visualized by color flow Doppler.  8. The aortic valve is tricuspid Aortic valve regurgitation was not visualized by color flow Doppler.  9. The pulmonic valve was grossly normal. Pulmonic valve regurgitation is not visualized by color flow Doppler. 10. The inferior vena cava is normal in size with <50% respiratory variability, suggesting right atrial pressure of 8 mmHg. 11. Small pericardial effusion. 12. The pericardial effusion is RV - inferior wall.   09/02/2019 Imaging   MRI brain: IMPRESSION: Negative for metastatic disease to the  brain.  No acute abnormality.    09/03/2019 Imaging   MRI abdomen: IMPRESSION: 1. Severely motion degraded MRI, substantially limiting evaluation. 2. Enhancing L3 vertebral and medial right iliac T2 hypointense bone lesions, concerning for bone metastases. PET-CT strongly recommended on a short term outpatient basis for further characterization. 3. Numerous T2 hyperintense lesions scattered throughout the liver, without definite enhancement on the severely motion degraded postcontrast sequences, presumably benign liver cysts. The liver can be better staged on PET-CT.   09/21/2019 Imaging   PET: IMPRESSION: 5.4 cm medial right upper lobe mass, corresponding to the patient's known primary bronchogenic neoplasm. Associated direct extension to the right perihilar region and left atrium.   Associated right perihilar and low right paratracheal nodal metastases.   Multifocal right lung opacities, likely reflecting a combination of tumor and postobstructive opacity/infection. Trace right pleural effusion.   Osseous metastases involving the L3 vertebral body, right iliac bone, and left femoral head/neck.   Otherwise, no evidence of metastatic disease in   the abdomen/pelvis.     I have reviewed the past medical history, past surgical history, social history and family history with the patient and they are unchanged from previous note.  ALLERGIES:  has No Known Allergies.  MEDICATIONS:  Current Outpatient Medications  Medication Sig Dispense Refill  . albuterol (VENTOLIN HFA) 108 (90 Base) MCG/ACT inhaler Inhale 2 puffs into the lungs every 6 (six) hours as needed for wheezing or shortness of breath. 18 g 2  . amLODipine (NORVASC) 10 MG tablet Take 10 mg by mouth every morning.    . baclofen (LIORESAL) 10 MG tablet TAKE 1 TABLET BY MOUTH THREE TIMES A DAY AS NEEDED FOR MUSCLE SPASMS (Patient taking differently: Take 10 mg by mouth 3 (three) times daily. ) 270 tablet 1  . benzonatate  (TESSALON) 100 MG capsule Take 1 capsule (100 mg total) by mouth 3 (three) times daily as needed for cough. 60 capsule 2  . buPROPion (WELLBUTRIN XL) 150 MG 24 hr tablet Take 1 tablet (150 mg total) by mouth daily.    . cefpodoxime (VANTIN) 100 MG tablet Take 100 mg by mouth every morning.    . chlorhexidine (PERIDEX) 0.12 % solution Rinse with 15 mls twice daily for 30 seconds. Use after breakfast and at bedtime. Spit out excess. Do not swallow. 480 mL prn  . citalopram (CELEXA) 40 MG tablet Take 1 tablet (40 mg total) by mouth daily. 90 tablet 1  . gabapentin (NEURONTIN) 300 MG capsule Take 2 capsules (600 mg total) by mouth 3 (three) times daily. 180 capsule 1  . guaiFENesin-dextromethorphan (ROBITUSSIN DM) 100-10 MG/5ML syrup Take 5 mLs by mouth every 4 (four) hours as needed for cough. 120 mL 1  . HYDROcodone-acetaminophen (NORCO) 7.5-325 MG tablet Take 1 tablet by mouth every 8 (eight) hours as needed for moderate pain. 60 tablet 0  . lisinopril (ZESTRIL) 40 MG tablet Take 40 mg by mouth every morning.    . metoprolol tartrate (LOPRESSOR) 50 MG tablet Take 1 tablet (50 mg total) by mouth 2 (two) times daily. 180 tablet 1  . nicotine (NICODERM CQ - DOSED IN MG/24 HOURS) 14 mg/24hr patch Place 1 patch (14 mg total) onto the skin daily. 28 patch 1  . ondansetron (ZOFRAN) 8 MG tablet Take 1 tablet (8 mg total) by mouth every 8 (eight) hours as needed for nausea or vomiting. 40 tablet 2  . osimertinib mesylate (TAGRISSO) 80 MG tablet Take 80 mg by mouth every morning.    . predniSONE (DELTASONE) 5 MG tablet Take 1 tablet (5 mg total) by mouth daily with breakfast. 30 tablet 3  . prochlorperazine (COMPAZINE) 10 MG tablet Take 1 tablet (10 mg total) by mouth every 8 (eight) hours as needed for nausea or vomiting. 40 tablet 2  . rivaroxaban (XARELTO) 20 MG TABS tablet Take 1 tablet (20 mg total) by mouth daily with supper. Please start this after you have completed the starter pack. 30 tablet 1  .  Vitamin D, Ergocalciferol, (DRISDOL) 1.25 MG (50000 UT) CAPS capsule TAKE 1 CAPSULE (50,000 UNITS TOTAL) BY MOUTH EVERY 7 (SEVEN) DAYS. 12 capsule 0   No current facility-administered medications for this visit.    PHYSICAL EXAMINATION: ECOG PERFORMANCE STATUS: 2 - Symptomatic, <50% confined to bed  Today's Vitals   11/09/19 1400  BP: 115/67  Pulse: (!) 103  Resp: 18  Temp: 97.7 F (36.5 C)  TempSrc: Oral  SpO2: 100%  PainSc: 4    There is no height or   weight on file to calculate BMI.  There were no vitals filed for this visit.  GENERAL: alert, no distress sitting in a wheelchair SKIN: skin color, texture, turgor are normal, no rashes or significant lesions EYES: conjunctiva are pink and non-injected, sclera clear OROPHARYNX: no exudate, no erythema; lips, buccal mucosa, and tongue normal  NECK: supple, non-tender LUNGS: clear to auscultation with normal breathing effort HEART: regular rate & rhythm and no murmurs and no lower extremity edema ABDOMEN: soft, non-tender, non-distended, normal bowel sounds Musculoskeletal: no cyanosis of digits and no clubbing  PSYCH: alert & oriented x 3, slowed speech, flat affect    LABORATORY DATA:  I have reviewed the data as listed    Component Value Date/Time   NA 139 11/09/2019 1329   K 4.1 11/09/2019 1329   CL 104 11/09/2019 1329   CO2 28 11/09/2019 1329   GLUCOSE 97 11/09/2019 1329   BUN 24 (H) 11/09/2019 1329   CREATININE 1.25 (H) 11/09/2019 1329   CALCIUM 9.5 11/09/2019 1329   PROT 7.6 11/09/2019 1329   ALBUMIN 4.0 11/09/2019 1329   AST 18 11/09/2019 1329   ALT 18 11/09/2019 1329   ALKPHOS 56 11/09/2019 1329   BILITOT 0.2 (L) 11/09/2019 1329   GFRNONAA 45 (L) 11/09/2019 1329   GFRAA 53 (L) 11/09/2019 1329    No results found for: SPEP, UPEP  Lab Results  Component Value Date   WBC 9.3 11/09/2019   NEUTROABS 7.1 11/09/2019   HGB 7.0 (L) 11/09/2019   HCT 23.1 (L) 11/09/2019   MCV 93.5 11/09/2019   PLT 321  11/09/2019      Chemistry      Component Value Date/Time   NA 139 11/09/2019 1329   K 4.1 11/09/2019 1329   CL 104 11/09/2019 1329   CO2 28 11/09/2019 1329   BUN 24 (H) 11/09/2019 1329   CREATININE 1.25 (H) 11/09/2019 1329      Component Value Date/Time   CALCIUM 9.5 11/09/2019 1329   ALKPHOS 56 11/09/2019 1329   AST 18 11/09/2019 1329   ALT 18 11/09/2019 1329   BILITOT 0.2 (L) 11/09/2019 1329       RADIOGRAPHIC STUDIES: I have personally reviewed the radiological images as listed below and agreed with the findings in the report. DG Foot Complete Left  Result Date: 10/25/2019 CLINICAL DATA:  Right foot pain and swelling following a fall in her bathroom yesterday. EXAM: LEFT FOOT - COMPLETE 3+ VIEW COMPARISON:  None. FINDINGS: Essentially nondisplaced fractures of the bases of the 2nd and 3rd metatarsals without intra-articular extension. Possible nondisplaced fracture of the base of the 1st metatarsal extending into the articulation with the medial cuneiform. No other fractures and no dislocations seen. Small calcific densities at the base of the 4th toe. Mild calcaneal enthesophyte formation. Diffuse distal soft tissue swelling. IMPRESSION: Essentially nondisplaced fractures of the bases of the 2nd and 3rd metatarsals and possible nondisplaced fracture of the base of the 1st metatarsal. Electronically Signed   By: Steven  Reid M.D.   On: 10/25/2019 12:32   

## 2019-11-10 ENCOUNTER — Other Ambulatory Visit: Payer: Self-pay | Admitting: Family Medicine

## 2019-11-10 ENCOUNTER — Inpatient Hospital Stay: Payer: Medicare Other

## 2019-11-10 VITALS — BP 130/75 | HR 85 | Temp 97.1°F | Resp 17

## 2019-11-10 DIAGNOSIS — D631 Anemia in chronic kidney disease: Secondary | ICD-10-CM | POA: Diagnosis not present

## 2019-11-10 DIAGNOSIS — N183 Chronic kidney disease, stage 3 unspecified: Secondary | ICD-10-CM | POA: Diagnosis not present

## 2019-11-10 DIAGNOSIS — E611 Iron deficiency: Secondary | ICD-10-CM | POA: Diagnosis not present

## 2019-11-10 DIAGNOSIS — C7951 Secondary malignant neoplasm of bone: Secondary | ICD-10-CM | POA: Diagnosis not present

## 2019-11-10 DIAGNOSIS — Z79899 Other long term (current) drug therapy: Secondary | ICD-10-CM | POA: Diagnosis not present

## 2019-11-10 DIAGNOSIS — D649 Anemia, unspecified: Secondary | ICD-10-CM

## 2019-11-10 DIAGNOSIS — C3411 Malignant neoplasm of upper lobe, right bronchus or lung: Secondary | ICD-10-CM | POA: Diagnosis not present

## 2019-11-10 DIAGNOSIS — C3491 Malignant neoplasm of unspecified part of right bronchus or lung: Secondary | ICD-10-CM

## 2019-11-10 LAB — ABO/RH: ABO/RH(D): O POS

## 2019-11-10 MED ORDER — DENOSUMAB 120 MG/1.7ML ~~LOC~~ SOLN
120.0000 mg | Freq: Once | SUBCUTANEOUS | Status: AC
  Administered 2019-11-10: 15:00:00 120 mg via SUBCUTANEOUS

## 2019-11-10 MED ORDER — DENOSUMAB 120 MG/1.7ML ~~LOC~~ SOLN
SUBCUTANEOUS | Status: AC
  Filled 2019-11-10: qty 1.7

## 2019-11-10 MED ORDER — DARBEPOETIN ALFA 500 MCG/ML IJ SOSY
500.0000 ug | PREFILLED_SYRINGE | Freq: Once | INTRAMUSCULAR | Status: AC
  Administered 2019-11-10: 15:00:00 500 ug via SUBCUTANEOUS

## 2019-11-10 MED ORDER — DARBEPOETIN ALFA 500 MCG/ML IJ SOSY
PREFILLED_SYRINGE | INTRAMUSCULAR | Status: AC
  Filled 2019-11-10: qty 1

## 2019-11-10 MED ORDER — SODIUM CHLORIDE 0.9% IV SOLUTION
250.0000 mL | Freq: Once | INTRAVENOUS | Status: AC
  Administered 2019-11-10: 10:00:00 250 mL via INTRAVENOUS
  Filled 2019-11-10: qty 250

## 2019-11-10 NOTE — Patient Instructions (Signed)
Blood Transfusion, Adult, Care After This sheet gives you information about how to care for yourself after your procedure. Your doctor may also give you more specific instructions. If you have problems or questions, contact your doctor. Follow these instructions at home:   Take over-the-counter and prescription medicines only as told by your doctor.  Go back to your normal activities as told by your doctor.  Follow instructions from your doctor about how to take care of the area where an IV tube was put into your vein (insertion site). Make sure you: ? Wash your hands with soap and water before you change your bandage (dressing). If there is no soap and water, use hand sanitizer. ? Change your bandage as told by your doctor.  Check your IV insertion site every day for signs of infection. Check for: ? More redness, swelling, or pain. ? More fluid or blood. ? Warmth. ? Pus or a bad smell. Contact a doctor if:  You have more redness, swelling, or pain around the IV insertion site.  You have more fluid or blood coming from the IV insertion site.  Your IV insertion site feels warm to the touch.  You have pus or a bad smell coming from the IV insertion site.  Your pee (urine) turns pink, red, or brown.  You feel weak after doing your normal activities. Get help right away if:  You have signs of a serious allergic or body defense (immune) system reaction, including: ? Itchiness. ? Hives. ? Trouble breathing. ? Anxiety. ? Pain in your chest or lower back. ? Fever, flushing, and chills. ? Fast pulse. ? Rash. ? Watery poop (diarrhea). ? Throwing up (vomiting). ? Dark pee. ? Serious headache. ? Dizziness. ? Stiff neck. ? Yellow color in your face or the white parts of your eyes (jaundice). Summary  After a blood transfusion, return to your normal activities as told by your doctor.  Every day, check for signs of infection where the IV tube was put into your vein.  Some  signs of infection are warm skin, more redness and pain, more fluid or blood, and pus or a bad smell where the needle went in.  Contact your doctor if you feel weak or have any unusual symptoms. This information is not intended to replace advice given to you by your health care provider. Make sure you discuss any questions you have with your health care provider. Document Released: 11/18/2014 Document Revised: 03/04/2018 Document Reviewed: 06/21/2016 Elsevier Patient Education  [place]. Denosumab injection What is this medicine? DENOSUMAB (den oh sue mab) slows bone breakdown. Prolia is used to treat osteoporosis in women after menopause and in men, and in people who are taking corticosteroids for 6 months or more. Delton See is used to treat a high calcium level due to cancer and to prevent bone fractures and other bone problems caused by multiple myeloma or cancer bone metastases. Delton See is also used to treat giant cell tumor of the bone. This medicine may be used for other purposes; ask your health care provider or pharmacist if you have questions. COMMON BRAND NAME(S): Prolia, XGEVA What should I tell my health care provider before I take this medicine? They need to know if you have any of these conditions:  dental disease  having surgery or tooth extraction  infection  kidney disease  low levels of calcium or Vitamin D in the blood  malnutrition  on hemodialysis  skin conditions or sensitivity  thyroid or parathyroid disease  an unusual reaction to denosumab, other medicines, foods, dyes, or preservatives  pregnant or trying to get pregnant  breast-feeding How should I use this medicine? This medicine is for injection under the skin. It is given by a health care professional in a hospital or clinic setting. A special MedGuide will be given to you before each treatment. Be sure to read this information carefully each time. For Prolia, talk to your pediatrician  regarding the use of this medicine in children. Special care may be needed. For Delton See, talk to your pediatrician regarding the use of this medicine in children. While this drug may be prescribed for children as young as 13 years for selected conditions, precautions do apply. Overdosage: If you think you have taken too much of this medicine contact a poison control center or emergency room at once. NOTE: This medicine is only for you. Do not share this medicine with others. What if I miss a dose? It is important not to miss your dose. Call your doctor or health care professional if you are unable to keep an appointment. What may interact with this medicine? Do not take this medicine with any of the following medications:  other medicines containing denosumab This medicine may also interact with the following medications:  medicines that lower your chance of fighting infection  steroid medicines like prednisone or cortisone This list may not describe all possible interactions. Give your health care provider a list of all the medicines, herbs, non-prescription drugs, or dietary supplements you use. Also tell them if you smoke, drink alcohol, or use illegal drugs. Some items may interact with your medicine. What should I watch for while using this medicine? Visit your doctor or health care professional for regular checks on your progress. Your doctor or health care professional may order blood tests and other tests to see how you are doing. Call your doctor or health care professional for advice if you get a fever, chills or sore throat, or other symptoms of a cold or flu. Do not treat yourself. This drug may decrease your body's ability to fight infection. Try to avoid being around people who are sick. You should make sure you get enough calcium and vitamin D while you are taking this medicine, unless your doctor tells you not to. Discuss the foods you eat and the vitamins you take with your health  care professional. See your dentist regularly. Brush and floss your teeth as directed. Before you have any dental work done, tell your dentist you are receiving this medicine. Do not become pregnant while taking this medicine or for 5 months after stopping it. Talk with your doctor or health care professional about your birth control options while taking this medicine. Women should inform their doctor if they wish to become pregnant or think they might be pregnant. There is a potential for serious side effects to an unborn child. Talk to your health care professional or pharmacist for more information. What side effects may I notice from receiving this medicine? Side effects that you should report to your doctor or health care professional as soon as possible:  allergic reactions like skin rash, itching or hives, swelling of the face, lips, or tongue  bone pain  breathing problems  dizziness  jaw pain, especially after dental work  redness, blistering, peeling of the skin  signs and symptoms of infection like fever or chills; cough; sore throat; pain or trouble passing urine  signs of low calcium like fast heartbeat, muscle cramps  or muscle pain; pain, tingling, numbness in the hands or feet; seizures  unusual bleeding or bruising  unusually weak or tired Side effects that usually do not require medical attention (report to your doctor or health care professional if they continue or are bothersome):  constipation  diarrhea  headache  joint pain  loss of appetite  muscle pain  runny nose  tiredness  upset stomach This list may not describe all possible side effects. Call your doctor for medical advice about side effects. You may report side effects to FDA at 1-800-FDA-1088. Where should I keep my medicine? This medicine is only given in a clinic, doctor's office, or other health care setting and will not be stored at home. NOTE: This sheet is a summary. It may not cover  all possible information. If you have questions about this medicine, talk to your doctor, pharmacist, or health care provider.  2020 Elsevier/Gold Standard (2018-03-06 16:10:44) Darbepoetin Alfa injection What is this medicine? DARBEPOETIN ALFA (dar be POE e tin AL fa) helps your body make more red blood cells. It is used to treat anemia caused by chronic kidney failure and chemotherapy. This medicine may be used for other purposes; ask your health care provider or pharmacist if you have questions. COMMON BRAND NAME(S): Aranesp What should I tell my health care provider before I take this medicine? They need to know if you have any of these conditions:  blood clotting disorders or history of blood clots  cancer patient not on chemotherapy  cystic fibrosis  heart disease, such as angina, heart failure, or a history of a heart attack  hemoglobin level of 12 g/dL or greater  high blood pressure  low levels of folate, iron, or vitamin B12  seizures  an unusual or allergic reaction to darbepoetin, erythropoietin, albumin, hamster proteins, latex, other medicines, foods, dyes, or preservatives  pregnant or trying to get pregnant  breast-feeding How should I use this medicine? This medicine is for injection into a vein or under the skin. It is usually given by a health care professional in a hospital or clinic setting. If you get this medicine at home, you will be taught how to prepare and give this medicine. Use exactly as directed. Take your medicine at regular intervals. Do not take your medicine more often than directed. It is important that you put your used needles and syringes in a special sharps container. Do not put them in a trash can. If you do not have a sharps container, call your pharmacist or healthcare provider to get one. A special MedGuide will be given to you by the pharmacist with each prescription and refill. Be sure to read this information carefully each  time. Talk to your pediatrician regarding the use of this medicine in children. While this medicine may be used in children as young as 59 month of age for selected conditions, precautions do apply. Overdosage: If you think you have taken too much of this medicine contact a poison control center or emergency room at once. NOTE: This medicine is only for you. Do not share this medicine with others. What if I miss a dose? If you miss a dose, take it as soon as you can. If it is almost time for your next dose, take only that dose. Do not take double or extra doses. What may interact with this medicine? Do not take this medicine with any of the following medications:  epoetin alfa This list may not describe all possible interactions.  Give your health care provider a list of all the medicines, herbs, non-prescription drugs, or dietary supplements you use. Also tell them if you smoke, drink alcohol, or use illegal drugs. Some items may interact with your medicine. What should I watch for while using this medicine? Your condition will be monitored carefully while you are receiving this medicine. You may need blood work done while you are taking this medicine. This medicine may cause a decrease in vitamin B6. You should make sure that you get enough vitamin B6 while you are taking this medicine. Discuss the foods you eat and the vitamins you take with your health care professional. What side effects may I notice from receiving this medicine? Side effects that you should report to your doctor or health care professional as soon as possible:  allergic reactions like skin rash, itching or hives, swelling of the face, lips, or tongue  breathing problems  changes in vision  chest pain  confusion, trouble speaking or understanding  feeling faint or lightheaded, falls  high blood pressure  muscle aches or pains  pain, swelling, warmth in the leg  rapid weight gain  severe headaches  sudden  numbness or weakness of the face, arm or leg  trouble walking, dizziness, loss of balance or coordination  seizures (convulsions)  swelling of the ankles, feet, hands  unusually weak or tired Side effects that usually do not require medical attention (report to your doctor or health care professional if they continue or are bothersome):  diarrhea  fever, chills (flu-like symptoms)  headaches  nausea, vomiting  redness, stinging, or swelling at site where injected This list may not describe all possible side effects. Call your doctor for medical advice about side effects. You may report side effects to FDA at 1-800-FDA-1088. Where should I keep my medicine? Keep out of the reach of children. Store in a refrigerator between 2 and 8 degrees C (36 and 46 degrees F). Do not freeze. Do not shake. Throw away any unused portion if using a single-dose vial. Throw away any unused medicine after the expiration date. NOTE: This sheet is a summary. It may not cover all possible information. If you have questions about this medicine, talk to your doctor, pharmacist, or health care provider.  2020 Elsevier/Gold Standard (2017-11-12 16:44:20)

## 2019-11-11 LAB — BPAM RBC
Blood Product Expiration Date: 202101302359
Blood Product Expiration Date: 202101302359
ISSUE DATE / TIME: 202012300733
ISSUE DATE / TIME: 202012300733
Unit Type and Rh: 5100
Unit Type and Rh: 5100

## 2019-11-11 LAB — TYPE AND SCREEN
ABO/RH(D): O POS
Antibody Screen: NEGATIVE
Unit division: 0
Unit division: 0

## 2019-11-13 ENCOUNTER — Other Ambulatory Visit (HOSPITAL_COMMUNITY)
Admission: RE | Admit: 2019-11-13 | Discharge: 2019-11-13 | Disposition: A | Discharge status: Home / Self Care | Payer: Medicare Other | Source: Ambulatory Visit | Attending: Internal Medicine | Admitting: Internal Medicine

## 2019-11-13 DIAGNOSIS — Z01812 Encounter for preprocedural laboratory examination: Secondary | ICD-10-CM | POA: Insufficient documentation

## 2019-11-13 DIAGNOSIS — Z20822 Contact with and (suspected) exposure to covid-19: Secondary | ICD-10-CM | POA: Diagnosis not present

## 2019-11-13 LAB — SARS CORONAVIRUS 2 (TAT 6-24 HRS): SARS Coronavirus 2: NEGATIVE

## 2019-11-15 ENCOUNTER — Ambulatory Visit (HOSPITAL_BASED_OUTPATIENT_CLINIC_OR_DEPARTMENT_OTHER): Payer: Medicare Other | Attending: Acute Care | Admitting: Internal Medicine

## 2019-11-16 ENCOUNTER — Telehealth: Payer: Self-pay | Admitting: Acute Care

## 2019-11-16 DIAGNOSIS — G4733 Obstructive sleep apnea (adult) (pediatric): Secondary | ICD-10-CM

## 2019-11-16 NOTE — Telephone Encounter (Signed)
SG can we change this order for CPAP titration to a sleep night study? The pt never had a diagnostic sleep study she only had an ONO that showed possible apneas which warranted the test. Please advise if I can place order.

## 2019-11-17 NOTE — Telephone Encounter (Signed)
Split night ordered as requested. Nothing further is needed.

## 2019-11-17 NOTE — Telephone Encounter (Signed)
It is fine to change the order to a diagnostic sleep study. Thanks

## 2019-11-18 ENCOUNTER — Other Ambulatory Visit: Payer: Self-pay | Admitting: Hematology

## 2019-11-22 ENCOUNTER — Encounter: Payer: Self-pay | Admitting: *Deleted

## 2019-11-24 ENCOUNTER — Other Ambulatory Visit: Payer: Self-pay | Admitting: Hematology & Oncology

## 2019-11-27 ENCOUNTER — Other Ambulatory Visit (HOSPITAL_COMMUNITY): Payer: Medicare Other

## 2019-11-30 ENCOUNTER — Encounter (HOSPITAL_BASED_OUTPATIENT_CLINIC_OR_DEPARTMENT_OTHER): Payer: Medicare Other | Admitting: Pulmonary Disease

## 2019-12-01 ENCOUNTER — Ambulatory Visit: Payer: Medicare Other | Admitting: Acute Care

## 2019-12-07 ENCOUNTER — Inpatient Hospital Stay (HOSPITAL_BASED_OUTPATIENT_CLINIC_OR_DEPARTMENT_OTHER): Payer: Medicare Other | Admitting: Hematology

## 2019-12-07 ENCOUNTER — Ambulatory Visit (HOSPITAL_BASED_OUTPATIENT_CLINIC_OR_DEPARTMENT_OTHER)
Admission: RE | Admit: 2019-12-07 | Discharge: 2019-12-07 | Disposition: A | Discharge status: Home / Self Care | Payer: Medicare Other | Source: Ambulatory Visit | Attending: Hematology | Admitting: Hematology

## 2019-12-07 ENCOUNTER — Inpatient Hospital Stay: Payer: Medicare Other

## 2019-12-07 ENCOUNTER — Telehealth: Payer: Self-pay | Admitting: Hematology

## 2019-12-07 ENCOUNTER — Inpatient Hospital Stay: Payer: Medicare Other | Attending: Hematology

## 2019-12-07 ENCOUNTER — Encounter: Payer: Self-pay | Admitting: Hematology

## 2019-12-07 ENCOUNTER — Other Ambulatory Visit: Payer: Self-pay

## 2019-12-07 VITALS — BP 140/100 | HR 99 | Temp 97.1°F | Resp 18 | Ht 64.0 in | Wt 156.0 lb

## 2019-12-07 DIAGNOSIS — C7951 Secondary malignant neoplasm of bone: Secondary | ICD-10-CM | POA: Diagnosis not present

## 2019-12-07 DIAGNOSIS — C3491 Malignant neoplasm of unspecified part of right bronchus or lung: Secondary | ICD-10-CM | POA: Diagnosis not present

## 2019-12-07 DIAGNOSIS — D631 Anemia in chronic kidney disease: Secondary | ICD-10-CM | POA: Diagnosis not present

## 2019-12-07 DIAGNOSIS — C3411 Malignant neoplasm of upper lobe, right bronchus or lung: Secondary | ICD-10-CM | POA: Diagnosis not present

## 2019-12-07 DIAGNOSIS — I2693 Single subsegmental pulmonary embolism without acute cor pulmonale: Secondary | ICD-10-CM | POA: Diagnosis not present

## 2019-12-07 DIAGNOSIS — D649 Anemia, unspecified: Secondary | ICD-10-CM

## 2019-12-07 DIAGNOSIS — N2889 Other specified disorders of kidney and ureter: Secondary | ICD-10-CM | POA: Diagnosis not present

## 2019-12-07 DIAGNOSIS — I700 Atherosclerosis of aorta: Secondary | ICD-10-CM | POA: Diagnosis not present

## 2019-12-07 DIAGNOSIS — N1831 Chronic kidney disease, stage 3a: Secondary | ICD-10-CM

## 2019-12-07 DIAGNOSIS — N183 Chronic kidney disease, stage 3 unspecified: Secondary | ICD-10-CM | POA: Diagnosis not present

## 2019-12-07 LAB — CBC WITH DIFFERENTIAL (CANCER CENTER ONLY)
Abs Immature Granulocytes: 0.03 10*3/uL (ref 0.00–0.07)
Basophils Absolute: 0 10*3/uL (ref 0.0–0.1)
Basophils Relative: 0 %
Eosinophils Absolute: 0.1 10*3/uL (ref 0.0–0.5)
Eosinophils Relative: 1 %
HCT: 35.9 % — ABNORMAL LOW (ref 36.0–46.0)
Hemoglobin: 10.9 g/dL — ABNORMAL LOW (ref 12.0–15.0)
Immature Granulocytes: 0 %
Lymphocytes Relative: 19 %
Lymphs Abs: 1.8 10*3/uL (ref 0.7–4.0)
MCH: 27.5 pg (ref 26.0–34.0)
MCHC: 30.4 g/dL (ref 30.0–36.0)
MCV: 90.4 fL (ref 80.0–100.0)
Monocytes Absolute: 0.6 10*3/uL (ref 0.1–1.0)
Monocytes Relative: 6 %
Neutro Abs: 6.8 10*3/uL (ref 1.7–7.7)
Neutrophils Relative %: 74 %
Platelet Count: 242 10*3/uL (ref 150–400)
RBC: 3.97 MIL/uL (ref 3.87–5.11)
RDW: 16.4 % — ABNORMAL HIGH (ref 11.5–15.5)
WBC Count: 9.3 10*3/uL (ref 4.0–10.5)
nRBC: 0 % (ref 0.0–0.2)

## 2019-12-07 LAB — CMP (CANCER CENTER ONLY)
ALT: 14 U/L (ref 0–44)
AST: 13 U/L — ABNORMAL LOW (ref 15–41)
Albumin: 3.9 g/dL (ref 3.5–5.0)
Alkaline Phosphatase: 56 U/L (ref 38–126)
Anion gap: 6 (ref 5–15)
BUN: 11 mg/dL (ref 8–23)
CO2: 27 mmol/L (ref 22–32)
Calcium: 9 mg/dL (ref 8.9–10.3)
Chloride: 105 mmol/L (ref 98–111)
Creatinine: 1.26 mg/dL — ABNORMAL HIGH (ref 0.44–1.00)
GFR, Est AFR Am: 52 mL/min — ABNORMAL LOW (ref >60)
GFR, Estimated: 45 mL/min — ABNORMAL LOW (ref >60)
Glucose, Bld: 83 mg/dL (ref 70–99)
Potassium: 4.7 mmol/L (ref 3.5–5.1)
Sodium: 138 mmol/L (ref 135–145)
Total Bilirubin: 0.2 mg/dL — ABNORMAL LOW (ref 0.3–1.2)
Total Protein: 7.3 g/dL (ref 6.5–8.1)

## 2019-12-07 MED ORDER — DARBEPOETIN ALFA 300 MCG/0.6ML IJ SOSY
PREFILLED_SYRINGE | INTRAMUSCULAR | Status: AC
  Filled 2019-12-07: qty 0.6

## 2019-12-07 MED ORDER — DARBEPOETIN ALFA 300 MCG/0.6ML IJ SOSY
300.0000 ug | PREFILLED_SYRINGE | Freq: Once | INTRAMUSCULAR | Status: AC
  Administered 2019-12-07: 300 ug via SUBCUTANEOUS

## 2019-12-07 MED ORDER — DENOSUMAB 120 MG/1.7ML ~~LOC~~ SOLN
120.0000 mg | Freq: Once | SUBCUTANEOUS | Status: AC
  Administered 2019-12-07: 120 mg via SUBCUTANEOUS

## 2019-12-07 MED ORDER — DENOSUMAB 120 MG/1.7ML ~~LOC~~ SOLN
SUBCUTANEOUS | Status: AC
  Filled 2019-12-07: qty 1.7

## 2019-12-07 MED ORDER — IOHEXOL 300 MG/ML  SOLN
100.0000 mL | Freq: Once | INTRAMUSCULAR | Status: AC | PRN
  Administered 2019-12-07: 10:00:00 100 mL via INTRAVENOUS

## 2019-12-07 NOTE — Telephone Encounter (Signed)
Appointments scheduled calendar printed per 1/26 los

## 2019-12-07 NOTE — Progress Notes (Signed)
[place] OFFICE PROGRESS NOTE  Patient Care Team: Marin Olp, MD as PCP - General (Family Medicine) [name], MD as Medical Oncologist (Oncology) Cordelia Poche, RN as Oncology Nurse Navigator  HEME/ONC OVERVIEW: 1. 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  In summary, CT showed ___.  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  -Cleared by dentistry -Review of the scans showed improving  bone metastases, as manifested by sclerosis  -Continue monthly Xgeva, Ca-Vit D supplement daily and Norco to 7.5/325, q8hrs PRN   Acute LLL PTE and bilateral DVT -Currently on Eliquis; patient denies any abnormal bleeding or bruising -Goal of anticoagulation is lifelong -Continue Xarelto '20mg'$  daily   Normocytic anemia -Most likely multifactorial, including anemia of chronic disease, CKD and treatment-related anemia  -S/p 2 doses of IV iron, last in 10/2019 -Due to persistent anemia requiring RBC transfusion, Aranesp was added in late 10/2019 -Hgb 10.9 today, significantly improving -I have reduced the Aranesp dose to [id number], and pending Hgb trend,we will determine the dose adjustment as needed  Stage III CKD -Cr fluctuating between 1.2 and 1.4 since 2019  -Cr 1.26 today, stable; electrolytes normal -Patient was counseled to avoid NSAIDs  -We will monitor it closely   No orders of the defined types were placed in this encounter.  All questions were answered. The patient knows to call the clinic with any problems, questions or concerns. No barriers to learning was detected.  Return in 1 month for labs, clinic appt, and injection.   YanTish MenD 1/26/202111:02 AM  CHIEF COMPLAINT: "I am feeling better"  INTERVAL HISTORY: [facility] for follow-up of metastatic squamous cell carcinoma of the lung on [place].  Patient reports that her energy level has improved significantly, and she is tolerating treatment well without significant side effects.  She denies any other complaint today.  REVIEW OF SYSTEMS:   Constitutional: ( - ) fevers, ( - )  chills , ( - ) night sweats Eyes: ( - )  blurriness of vision, ( - ) double vision, ( - ) watery eyes Ears, nose, mouth, throat, and face: ( - ) mucositis, ( - ) sore throat Respiratory: ( - ) cough, ( - ) dyspnea, ( - ) wheezes Cardiovascular: ( - ) palpitation, ( - ) chest discomfort, ( - ) lower extremity  swelling Gastrointestinal:  ( - ) nausea, ( - ) heartburn, ( - ) change in bowel habits Skin: ( - ) abnormal skin rashes Lymphatics: ( - ) new lymphadenopathy, ( - ) easy bruising Neurological: ( - ) numbness, ( - ) tingling, ( - ) new weaknesses Behavioral/Psych: ( - ) mood change, ( - ) new changes  All other systems were reviewed with the patient and are negative.  SUMMARY OF ONCOLOGIC HISTORY: Oncology History  Squamous cell carcinoma of lung, right ([place])  08/31/2019 Initial Diagnosis   Squamous cell carcinoma of lung, right ([place])   08/31/2019 Imaging   CT chest w/ contrast: IMPRESSION: 1. Large poorly defined partially cavitary mass in the right upper lobe measuring approximately 4.1 x 6.4 x 3.8 cm, contiguous with ill-defined mass and/or adenopathy within the mediastinum. 1.9 cm pedunculated filling defect within the right aspect of the left atrium, appears contiguous with mediastinal soft tissue density and is concerning for left atrial invasion. There is narrowing and partial encasement of right upper lobe pulmonary arteries. 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 2. Small nonocclusive thrombus within subsegmental left lower lobe pulmonary artery. 3. 12 mm irregular right upper lobe pulmonary nodule which is either infectious, inflammatory, or neoplastic. Multifocal ill-defined consolidations in the right upper and middle lobes with diffuse ground-glass density in the right lower lobe, findings could be secondary to pneumonia, with consideration given to associated pulmonary infarcts given occluded appearance of right middle and lower lobe pulmonary arteries. 4. Numerous hypodense liver lesions, some of  which are consistent with cysts. Given findings in the chest, recommend correlation with nonemergent MRI. 5. Incompletely visualized age indeterminate fracture inferior endplate T12 6. Small pericardial effusion   09/01/2019 Imaging   Doppler: Summary: Right: Findings consistent with acute deep vein thrombosis involving the right posterior tibial veins. No cystic structure found in the popliteal fossa. Left: Findings consistent with acute deep vein thrombosis involving the left popliteal vein. No cystic structure found in the popliteal fossa.   09/02/2019 Imaging   MRI brain w/ contrast:IMPRESSION: Negative for metastatic disease to the brain.  No acute abnormality.   09/02/2019 Pathology Results   FINAL MICROSCOPIC DIAGNOSIS:   A. LUNG, RIGHT UPPER LOBE, BIOPSY:  - Benign bronchial epithelium.    09/02/2019 Pathology Results   Clinical History: Transbronchial brushings, RUL  Specimen Submitted:  A. LUNG, RIGHT UPPER LOBE, BRUSHINGS:    FINAL MICROSCOPIC DIAGNOSIS:  - Malignant cells consistent with squamous cell carcinoma    09/02/2019 Imaging   Echocardiogram: IMPRESSIONS      1. Left ventricular ejection fraction, by visual estimation, is 60 to 65%. The left ventricle has normal function. There is mildly increased left ventricular hypertrophy.  2. Left ventricular diastolic Doppler parameters are consistent with impaired relaxation pattern of LV diastolic filling.  3. Global right ventricle has mildly reduced systolic function.The right ventricular size is normal. No increase in right ventricular wall thickness.  4. Left atrial size was normal.  5. Right atrial size was normal.  6. The mitral valve is grossly normal. Trace mitral valve regurgitation.  7. The tricuspid valve is grossly normal. Tricuspid valve regurgitation was not visualized by color flow Doppler.  8. The aortic valve is tricuspid Aortic valve regurgitation was not visualized by color flow Doppler.  9.  The pulmonic valve was grossly normal. Pulmonic valve regurgitation is not visualized by color flow Doppler. 10. The inferior vena cava is normal in size with <50% respiratory variability, suggesting right atrial pressure of 8 mmHg. 11. Small pericardial effusion. 12. The pericardial effusion is RV - inferior wall.   09/02/2019 Imaging   MRI brain: IMPRESSION: Negative for metastatic disease to the brain.  No acute abnormality.    09/03/2019 Imaging   MRI abdomen: IMPRESSION: 1. Severely motion degraded MRI, substantially limiting evaluation. 2. Enhancing L3 vertebral and medial right iliac T2 hypointense bone lesions, concerning for bone metastases. PET-CT strongly recommended on a short term outpatient basis for further characterization. 3. Numerous T2 hyperintense lesions scattered throughout the liver, without definite enhancement on the severely motion degraded postcontrast sequences, presumably benign liver cysts. The liver can be better staged on PET-CT.   09/21/2019 Imaging   PET: IMPRESSION: 5.4 cm medial right upper lobe mass, corresponding to the patient's known primary bronchogenic neoplasm. Associated direct extension to the right perihilar region and left atrium.   Associated right perihilar and low right paratracheal nodal metastases.   Multifocal right lung opacities, likely reflecting a combination of tumor and postobstructive opacity/infection. Trace right pleural effusion.   Osseous metastases involving the L3 vertebral body, right iliac bone, and left femoral head/neck.   Otherwise, no evidence of metastatic disease in the abdomen/pelvis.     I have reviewed the past medical history, past surgical history, social history and family history with the patient and they are unchanged from previous note.  ALLERGIES:  has No Known Allergies.  MEDICATIONS:  Current Outpatient Medications  Medication Sig Dispense Refill  . albuterol (VENTOLIN HFA) 108 (90  Base) MCG/ACT inhaler Inhale 2 puffs into the lungs every 6 (six) hours as needed for wheezing or shortness of breath. 18 g 2  . amLODipine (NORVASC) 10 MG tablet Take 10 mg by mouth every morning.    . baclofen (LIORESAL) 10 MG tablet TAKE 1 TABLET BY MOUTH THREE TIMES A DAY AS NEEDED FOR MUSCLE SPASMS (Patient taking differently: Take 10 mg by mouth 3 (three) times daily. ) 270 tablet 1  . benzonatate (TESSALON) 100 MG capsule Take 1 capsule (100 mg total) by mouth 3 (three) times daily as needed for cough. 60 capsule 2  . buPROPion (WELLBUTRIN XL) 150 MG 24 hr tablet Take 1 tablet (150 mg total) by mouth daily.    . cefpodoxime (VANTIN) 100 MG tablet Take 100 mg by mouth every morning.    . chlorhexidine (PERIDEX) 0.12 % solution Rinse with 15 mls twice daily for 30 seconds. Use after breakfast and at bedtime. Spit out excess. Do not swallow. 480 mL prn  . citalopram (CELEXA) 40 MG tablet Take 1 tablet (40 mg total) by mouth daily. 90 tablet 1  . gabapentin (NEURONTIN) 300 MG capsule Take 2 capsules (600 mg total) by mouth 3 (three) times daily. 180 capsule 1  . guaiFENesin-dextromethorphan (ROBITUSSIN DM) 100-10 MG/5ML syrup Take 5 mLs by mouth every 4 (four) hours as needed for cough. 120 mL 1  . HYDROcodone-acetaminophen (NORCO) 7.5-325 MG tablet Take 1  tablet by mouth every 8 (eight) hours as needed for moderate pain. 60 tablet 0  . lisinopril (ZESTRIL) 40 MG tablet Take 40 mg by mouth every morning.    . metoprolol tartrate (LOPRESSOR) 50 MG tablet Take 1 tablet (50 mg total) by mouth 2 (two) times daily. 180 tablet 1  . nicotine (NICODERM CQ - DOSED IN MG/24 HOURS) 14 mg/24hr patch Place 1 patch (14 mg total) onto the skin daily. 28 patch 1  . ondansetron (ZOFRAN) 8 MG tablet Take 1 tablet (8 mg total) by mouth every 8 (eight) hours as needed for nausea or vomiting. 40 tablet 2  . osimertinib mesylate (TAGRISSO) 80 MG tablet Take 80 mg by mouth every morning.    . predniSONE (DELTASONE) 5 MG  tablet Take 1 tablet (5 mg total) by mouth daily with breakfast. 30 tablet 3  . prochlorperazine (COMPAZINE) 10 MG tablet Take 1 tablet (10 mg total) by mouth every 8 (eight) hours as needed for nausea or vomiting. 40 tablet 2  . rivaroxaban (XARELTO) 20 MG TABS tablet Take 1 tablet (20 mg total) by mouth daily with supper. Please start this after you have completed the starter pack. 30 tablet 1  . Vitamin D, Ergocalciferol, (DRISDOL) 1.25 MG (50000 UT) CAPS capsule TAKE 1 CAPSULE (50,000 UNITS TOTAL) BY MOUTH EVERY 7 (SEVEN) DAYS. 12 capsule 0   No current facility-administered medications for this visit.    PHYSICAL EXAMINATION: ECOG PERFORMANCE STATUS: 2 - Symptomatic, <50% confined to bed  Today's Vitals   12/07/19 1057  BP: (!) 140/100  Pulse: 99  Resp: 18  Temp: (!) 97.1 F (36.2 C)  TempSrc: Temporal  SpO2: 100%  Weight: 156 lb (70.8 kg)  Height: '5\' 4"'$  (1.626 m)  PainSc: 0-No pain   Body mass index is 26.78 kg/m.  Filed Weights   12/07/19 1057  Weight: 156 lb (70.8 kg)    GENERAL: alert, no distress and comfortable sitting in a wheelchair  SKIN: skin color, texture, turgor are normal, no rashes or significant lesions EYES: conjunctiva are pink and non-injected, sclera clear OROPHARYNX: no exudate, no erythema; lips, buccal mucosa, and tongue normal  NECK: supple, non-tender LUNGS: clear to auscultation with normal breathing effort HEART: regular rate & rhythm and no murmurs and no lower extremity edema ABDOMEN: soft, non-tender, non-distended, normal bowel sounds Musculoskeletal: no cyanosis of digits and no clubbing  PSYCH: alert & oriented x 3, fluent speech  LABORATORY DATA:  I have reviewed the data as listed    Component Value Date/Time   NA 139 11/09/2019 1329   K 4.1 11/09/2019 1329   CL 104 11/09/2019 1329   CO2 28 11/09/2019 1329   GLUCOSE 97 11/09/2019 1329   BUN 24 (H) 11/09/2019 1329   CREATININE 1.25 (H) 11/09/2019 1329   CALCIUM 9.5 11/09/2019  1329   PROT 7.6 11/09/2019 1329   ALBUMIN 4.0 11/09/2019 1329   AST 18 11/09/2019 1329   ALT 18 11/09/2019 1329   ALKPHOS 56 11/09/2019 1329   BILITOT 0.2 (L) 11/09/2019 1329   GFRNONAA 45 (L) 11/09/2019 1329   GFRAA 53 (L) 11/09/2019 1329    No results found for: SPEP, UPEP  Lab Results  Component Value Date   WBC 9.3 12/07/2019   NEUTROABS 6.8 12/07/2019   HGB 10.9 (L) 12/07/2019   HCT 35.9 (L) 12/07/2019   MCV 90.4 12/07/2019   PLT 242 12/07/2019      Chemistry      Component Value Date/Time  NA 139 11/09/2019 1329   K 4.1 11/09/2019 1329   CL 104 11/09/2019 1329   CO2 28 11/09/2019 1329   BUN 24 (H) 11/09/2019 1329   CREATININE 1.25 (H) 11/09/2019 1329      Component Value Date/Time   CALCIUM 9.5 11/09/2019 1329   ALKPHOS 56 11/09/2019 1329   AST 18 11/09/2019 1329   ALT 18 11/09/2019 1329   BILITOT 0.2 (L) 11/09/2019 1329       RADIOGRAPHIC STUDIES: I have personally reviewed the radiological images as listed below and agreed with the findings in the report. No results found.

## 2019-12-07 NOTE — Patient Instructions (Signed)
Denosumab injection What is this medicine? DENOSUMAB (den oh sue mab) slows bone breakdown. Prolia is used to treat osteoporosis in women after menopause and in men, and in people who are taking corticosteroids for 6 months or more. Xgeva is used to treat a high calcium level due to cancer and to prevent bone fractures and other bone problems caused by multiple myeloma or cancer bone metastases. Xgeva is also used to treat giant cell tumor of the bone. This medicine may be used for other purposes; ask your health care provider or pharmacist if you have questions. COMMON BRAND NAME(S): Prolia, XGEVA What should I tell my health care provider before I take this medicine? They need to know if you have any of these conditions:  dental disease  having surgery or tooth extraction  infection  kidney disease  low levels of calcium or Vitamin D in the blood  malnutrition  on hemodialysis  skin conditions or sensitivity  thyroid or parathyroid disease  an unusual reaction to denosumab, other medicines, foods, dyes, or preservatives  pregnant or trying to get pregnant  breast-feeding How should I use this medicine? This medicine is for injection under the skin. It is given by a health care professional in a hospital or clinic setting. A special MedGuide will be given to you before each treatment. Be sure to read this information carefully each time. For Prolia, talk to your pediatrician regarding the use of this medicine in children. Special care may be needed. For Xgeva, talk to your pediatrician regarding the use of this medicine in children. While this drug may be prescribed for children as young as 13 years for selected conditions, precautions do apply. Overdosage: If you think you have taken too much of this medicine contact a poison control center or emergency room at once. NOTE: This medicine is only for you. Do not share this medicine with others. What if I miss a dose? It is  important not to miss your dose. Call your doctor or health care professional if you are unable to keep an appointment. What may interact with this medicine? Do not take this medicine with any of the following medications:  other medicines containing denosumab This medicine may also interact with the following medications:  medicines that lower your chance of fighting infection  steroid medicines like prednisone or cortisone This list may not describe all possible interactions. Give your health care provider a list of all the medicines, herbs, non-prescription drugs, or dietary supplements you use. Also tell them if you smoke, drink alcohol, or use illegal drugs. Some items may interact with your medicine. What should I watch for while using this medicine? Visit your doctor or health care professional for regular checks on your progress. Your doctor or health care professional may order blood tests and other tests to see how you are doing. Call your doctor or health care professional for advice if you get a fever, chills or sore throat, or other symptoms of a cold or flu. Do not treat yourself. This drug may decrease your body's ability to fight infection. Try to avoid being around people who are sick. You should make sure you get enough calcium and vitamin D while you are taking this medicine, unless your doctor tells you not to. Discuss the foods you eat and the vitamins you take with your health care professional. See your dentist regularly. Brush and floss your teeth as directed. Before you have any dental work done, tell your dentist you are   receiving this medicine. Do not become pregnant while taking this medicine or for 5 months after stopping it. Talk with your doctor or health care professional about your birth control options while taking this medicine. Women should inform their doctor if they wish to become pregnant or think they might be pregnant. There is a potential for serious side  effects to an unborn child. Talk to your health care professional or pharmacist for more information. What side effects may I notice from receiving this medicine? Side effects that you should report to your doctor or health care professional as soon as possible:  allergic reactions like skin rash, itching or hives, swelling of the face, lips, or tongue  bone pain  breathing problems  dizziness  jaw pain, especially after dental work  redness, blistering, peeling of the skin  signs and symptoms of infection like fever or chills; cough; sore throat; pain or trouble passing urine  signs of low calcium like fast heartbeat, muscle cramps or muscle pain; pain, tingling, numbness in the hands or feet; seizures  unusual bleeding or bruising  unusually weak or tired Side effects that usually do not require medical attention (report to your doctor or health care professional if they continue or are bothersome):  constipation  diarrhea  headache  joint pain  loss of appetite  muscle pain  runny nose  tiredness  upset stomach This list may not describe all possible side effects. Call your doctor for medical advice about side effects. You may report side effects to FDA at 1-800-FDA-1088. Where should I keep my medicine? This medicine is only given in a clinic, doctor's office, or other health care setting and will not be stored at home. NOTE: This sheet is a summary. It may not cover all possible information. If you have questions about this medicine, talk to your doctor, pharmacist, or health care provider.  2020 [employer] Standard (2018-03-06 16:10:44) Darbepoetin Alfa injection What is this medicine? DARBEPOETIN ALFA (dar be POE e tin AL fa) helps your body make more red blood cells. It is used to treat anemia caused by chronic kidney failure and chemotherapy. This medicine may be used for other purposes; ask your health care provider or pharmacist if you have  questions. COMMON BRAND NAME(S): Aranesp What should I tell my health care provider before I take this medicine? They need to know if you have any of these conditions:  blood clotting disorders or history of blood clots  cancer patient not on chemotherapy  cystic fibrosis  heart disease, such as angina, heart failure, or a history of a heart attack  hemoglobin level of 12 g/dL or greater  high blood pressure  low levels of folate, iron, or vitamin B12  seizures  an unusual or allergic reaction to darbepoetin, erythropoietin, albumin, hamster proteins, latex, other medicines, foods, dyes, or preservatives  pregnant or trying to get pregnant  breast-feeding How should I use this medicine? This medicine is for injection into a vein or under the skin. It is usually given by a health care professional in a hospital or clinic setting. If you get this medicine at home, you will be taught how to prepare and give this medicine. Use exactly as directed. Take your medicine at regular intervals. Do not take your medicine more often than directed. It is important that you put your used needles and syringes in a special sharps container. Do not put them in a trash can. If you do not have a sharps container, call  your pharmacist or healthcare provider to get one. A special MedGuide will be given to you by the pharmacist with each prescription and refill. Be sure to read this information carefully each time. Talk to your pediatrician regarding the use of this medicine in children. While this medicine may be used in children as young as 70 month of age for selected conditions, precautions do apply. Overdosage: If you think you have taken too much of this medicine contact a poison control center or emergency room at once. NOTE: This medicine is only for you. Do not share this medicine with others. What if I miss a dose? If you miss a dose, take it as soon as you can. If it is almost time for your  next dose, take only that dose. Do not take double or extra doses. What may interact with this medicine? Do not take this medicine with any of the following medications:  epoetin alfa This list may not describe all possible interactions. Give your health care provider a list of all the medicines, herbs, non-prescription drugs, or dietary supplements you use. Also tell them if you smoke, drink alcohol, or use illegal drugs. Some items may interact with your medicine. What should I watch for while using this medicine? Your condition will be monitored carefully while you are receiving this medicine. You may need blood work done while you are taking this medicine. This medicine may cause a decrease in vitamin B6. You should make sure that you get enough vitamin B6 while you are taking this medicine. Discuss the foods you eat and the vitamins you take with your health care professional. What side effects may I notice from receiving this medicine? Side effects that you should report to your doctor or health care professional as soon as possible:  allergic reactions like skin rash, itching or hives, swelling of the face, lips, or tongue  breathing problems  changes in vision  chest pain  confusion, trouble speaking or understanding  feeling faint or lightheaded, falls  high blood pressure  muscle aches or pains  pain, swelling, warmth in the leg  rapid weight gain  severe headaches  sudden numbness or weakness of the face, arm or leg  trouble walking, dizziness, loss of balance or coordination  seizures (convulsions)  swelling of the ankles, feet, hands  unusually weak or tired Side effects that usually do not require medical attention (report to your doctor or health care professional if they continue or are bothersome):  diarrhea  fever, chills (flu-like symptoms)  headaches  nausea, vomiting  redness, stinging, or swelling at site where injected This list may not  describe all possible side effects. Call your doctor for medical advice about side effects. You may report side effects to FDA at 1-800-FDA-1088. Where should I keep my medicine? Keep out of the reach of children. Store in a refrigerator between 2 and 8 degrees C (36 and 46 degrees F). Do not freeze. Do not shake. Throw away any unused portion if using a single-dose vial. Throw away any unused medicine after the expiration date. NOTE: This sheet is a summary. It may not cover all possible information. If you have questions about this medicine, talk to your doctor, pharmacist, or health care provider.  2020 [employer] Standard (2017-11-12 16:44:20)

## 2019-12-10 DIAGNOSIS — M79672 Pain in left foot: Secondary | ICD-10-CM | POA: Diagnosis not present

## 2019-12-13 ENCOUNTER — Other Ambulatory Visit: Payer: Self-pay | Admitting: Acute Care

## 2019-12-13 ENCOUNTER — Other Ambulatory Visit: Payer: Self-pay | Admitting: *Deleted

## 2019-12-13 DIAGNOSIS — R059 Cough, unspecified: Secondary | ICD-10-CM

## 2019-12-13 DIAGNOSIS — R05 Cough: Secondary | ICD-10-CM

## 2019-12-13 MED ORDER — BENZONATATE 100 MG PO CAPS: 100.0000 mg | ORAL_CAPSULE | Freq: Three times a day (TID) | ORAL | 2 refills | Status: DC | PRN

## 2019-12-16 ENCOUNTER — Other Ambulatory Visit: Payer: Self-pay | Admitting: Family Medicine

## 2019-12-16 NOTE — Telephone Encounter (Signed)
Last OV 05/10/19 Last refill - by Dr. Tana Coast Next OV not scheduled

## 2019-12-20 ENCOUNTER — Encounter: Payer: Self-pay | Admitting: *Deleted

## 2019-12-21 ENCOUNTER — Other Ambulatory Visit: Payer: Self-pay | Admitting: *Deleted

## 2019-12-21 DIAGNOSIS — C3491 Malignant neoplasm of unspecified part of right bronchus or lung: Secondary | ICD-10-CM

## 2019-12-21 MED ORDER — HYDROCODONE-ACETAMINOPHEN 5-325 MG PO TABS: 1.0000 | ORAL_TABLET | Freq: Three times a day (TID) | ORAL | 0 refills | Status: DC | PRN

## 2019-12-22 IMAGING — DX DG FOOT COMPLETE 3+V*L*
3 series · 3 of 3 positions shown · non-contrast
Comparison: None.

CLINICAL DATA: Right foot pain and swelling following a fall in her
bathroom yesterday.

EXAM:
LEFT FOOT - COMPLETE 3+ VIEW

[foot ap]
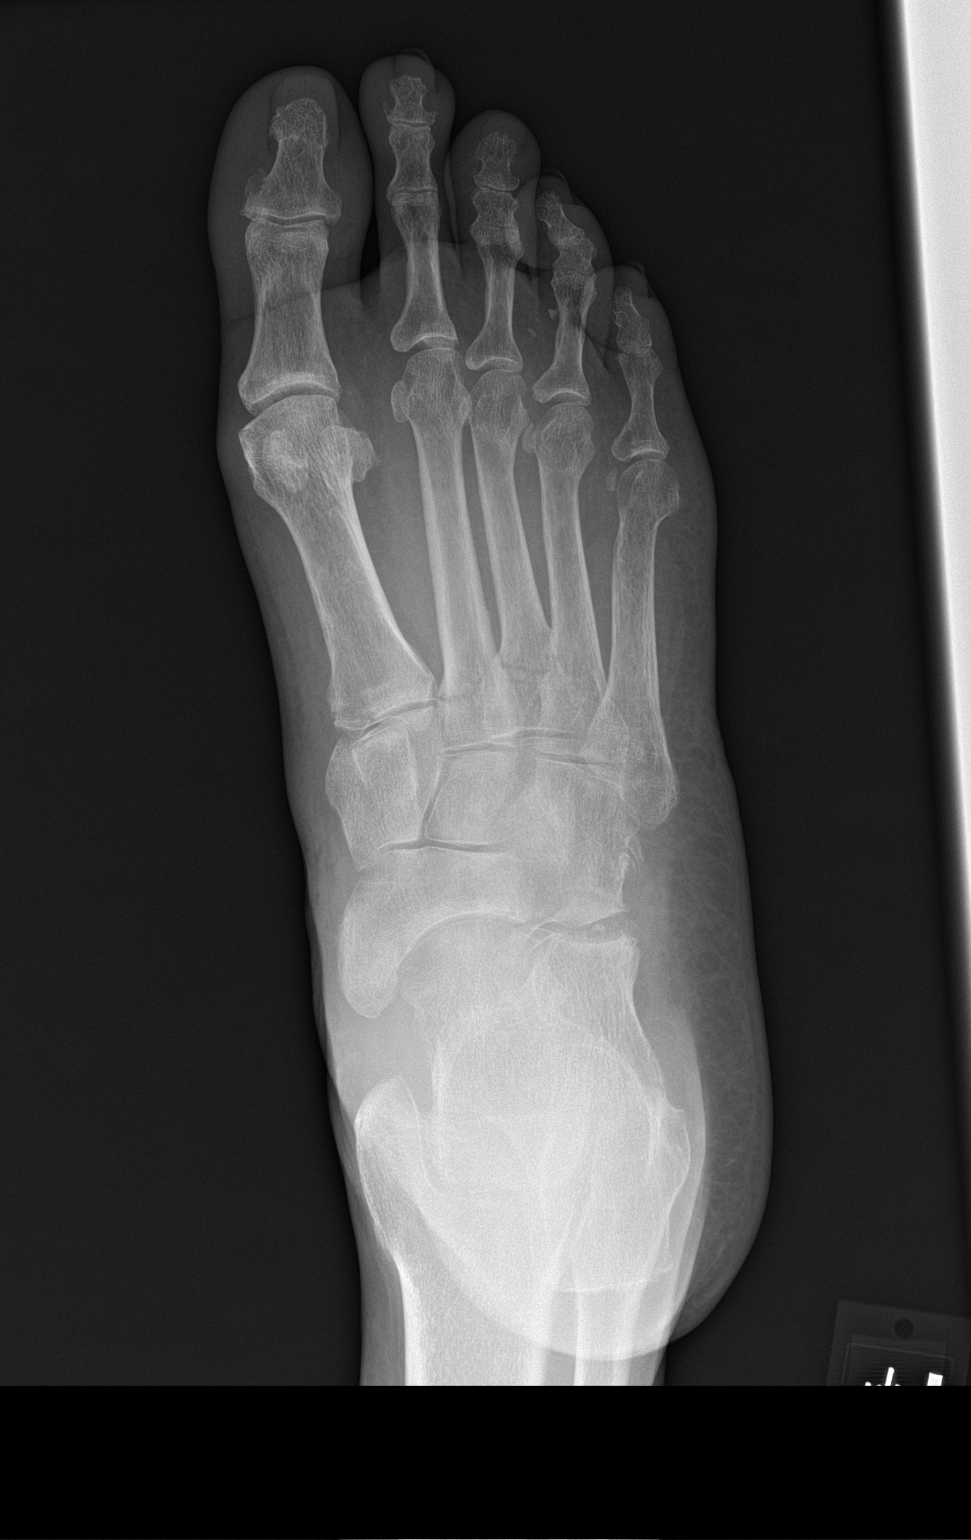

[foot obl]
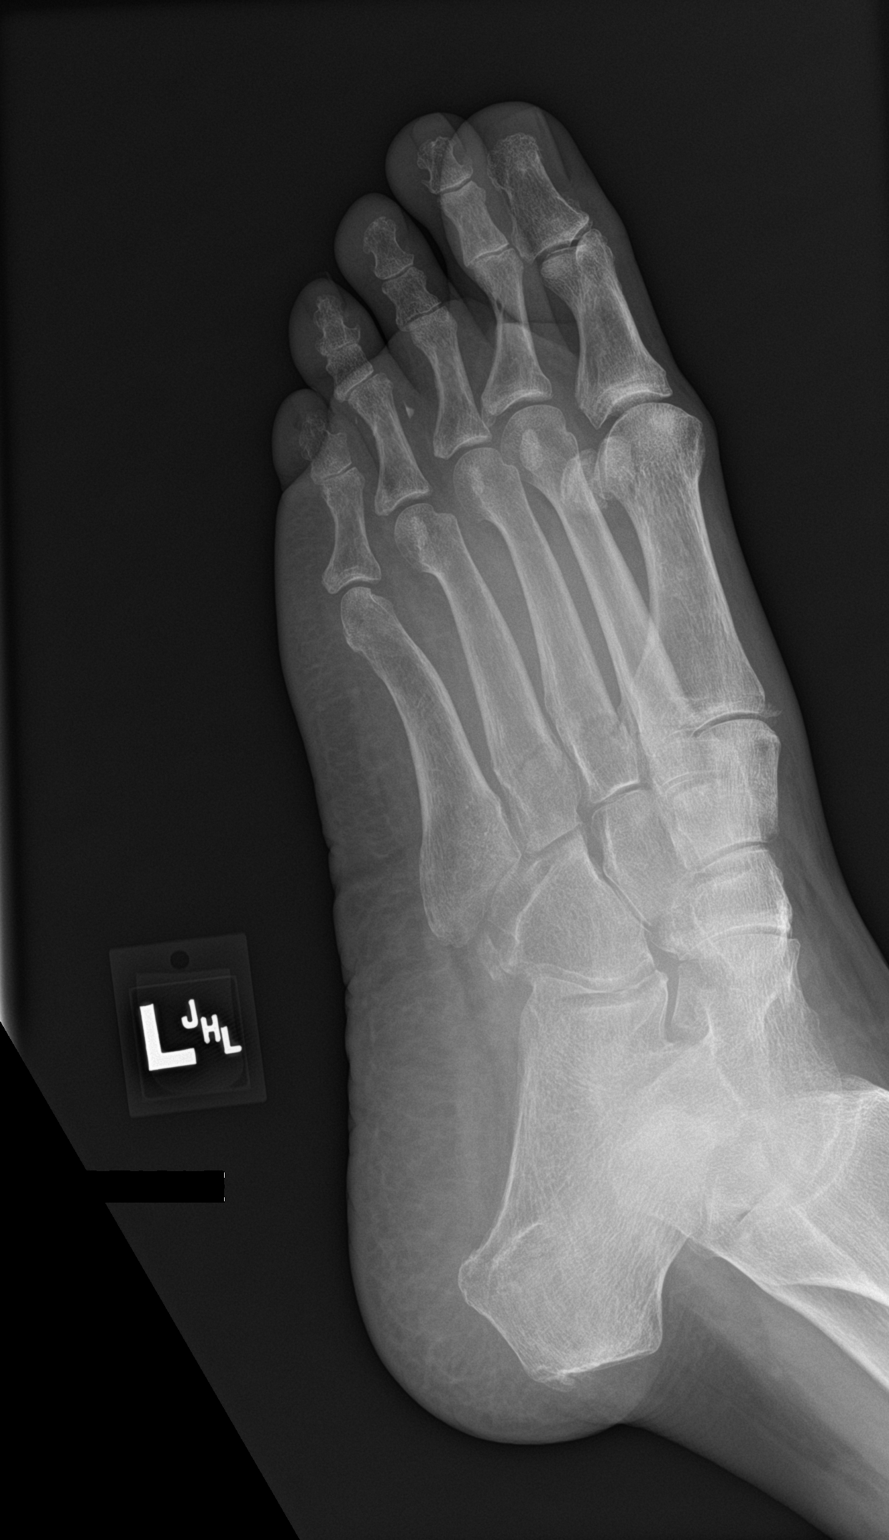

[foot lat]
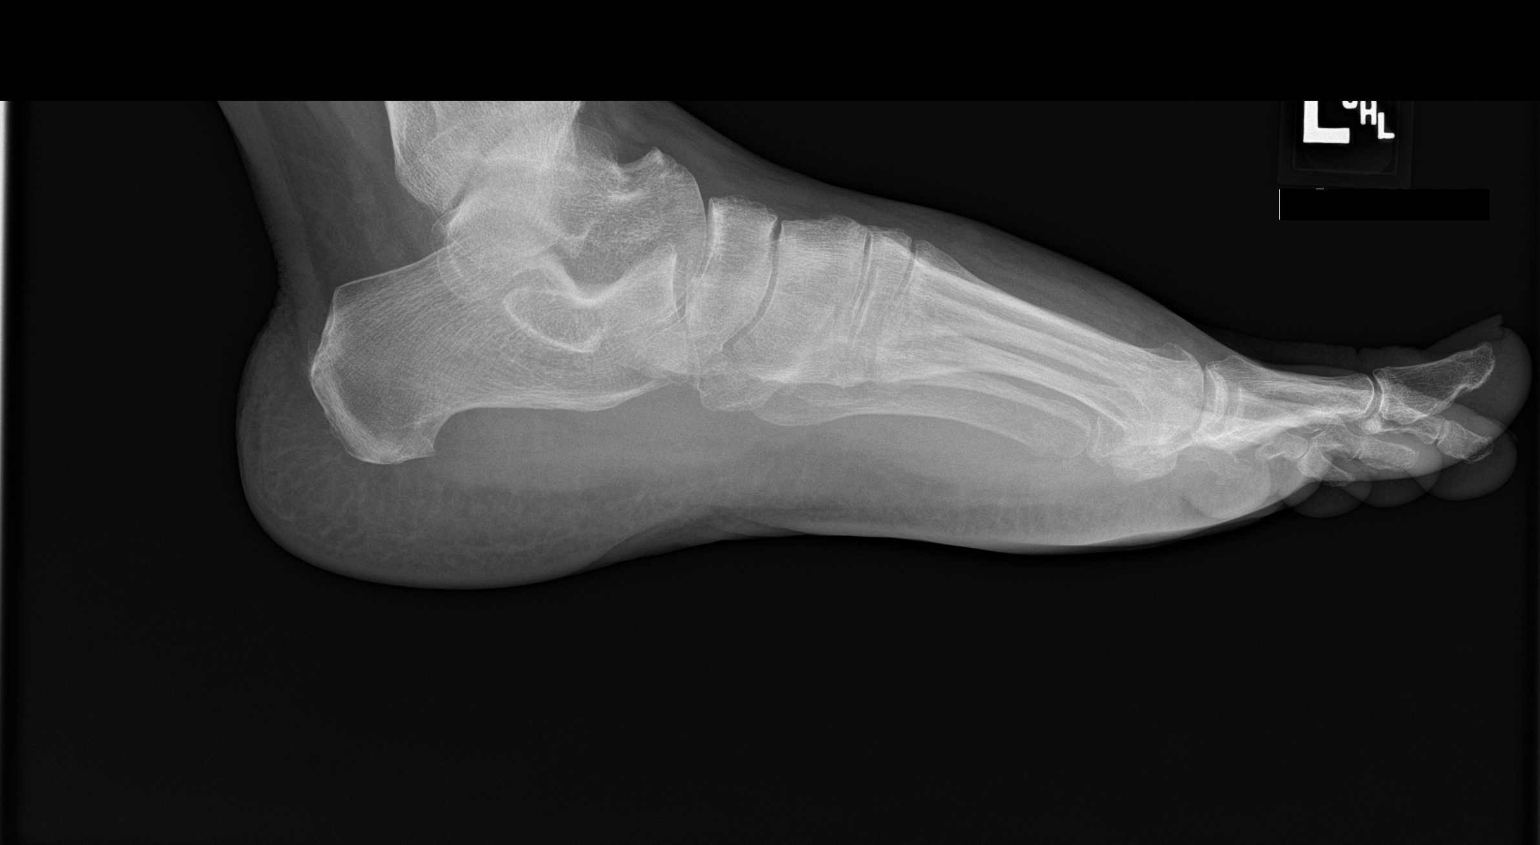

[3 of 3 positions shown; findings below may reference images not displayed]

FINDINGS: Essentially nondisplaced fractures of the bases of the 2nd and 3rd
metatarsals without intra-articular extension. Possible nondisplaced
fracture of the base of the 1st metatarsal extending into the
articulation with the medial cuneiform. No other fractures and no
dislocations seen. Small calcific densities at the base of the 4th
toe. Mild calcaneal enthesophyte formation. Diffuse distal soft
tissue swelling.
IMPRESSION: Essentially nondisplaced fractures of the bases of the 2nd and 3rd
metatarsals and possible nondisplaced fracture of the base of the
1st metatarsal.

## 2019-12-27 ENCOUNTER — Telehealth: Payer: Self-pay

## 2019-12-27 NOTE — Telephone Encounter (Signed)
Pt want to have a refill of medication to help quit smoking. Pt do not know the name of medication.. please return pt call

## 2019-12-28 ENCOUNTER — Encounter: Payer: Self-pay | Admitting: Family Medicine

## 2019-12-28 ENCOUNTER — Other Ambulatory Visit: Payer: Self-pay

## 2019-12-28 ENCOUNTER — Other Ambulatory Visit: Payer: Self-pay | Admitting: Hematology

## 2019-12-28 MED ORDER — GABAPENTIN 300 MG PO CAPS: 600.0000 mg | ORAL_CAPSULE | Freq: Three times a day (TID) | ORAL | 1 refills | Status: DC

## 2019-12-28 NOTE — Telephone Encounter (Signed)
Left message to return call to our office. With daughter we are not able to give any information she does not have anyone on dpr

## 2019-12-30 ENCOUNTER — Other Ambulatory Visit: Payer: Self-pay | Admitting: *Deleted

## 2019-12-30 DIAGNOSIS — C3491 Malignant neoplasm of unspecified part of right bronchus or lung: Secondary | ICD-10-CM

## 2019-12-31 MED ORDER — HYDROCODONE-ACETAMINOPHEN 5-325 MG PO TABS: 1.0000 | ORAL_TABLET | Freq: Three times a day (TID) | ORAL | 0 refills | Status: DC | PRN

## 2020-01-04 ENCOUNTER — Inpatient Hospital Stay: Payer: Medicare Other | Attending: Hematology

## 2020-01-04 ENCOUNTER — Inpatient Hospital Stay: Payer: Medicare Other

## 2020-01-04 ENCOUNTER — Inpatient Hospital Stay (HOSPITAL_BASED_OUTPATIENT_CLINIC_OR_DEPARTMENT_OTHER): Payer: Medicare Other | Admitting: Hematology

## 2020-01-04 ENCOUNTER — Encounter: Payer: Self-pay | Admitting: *Deleted

## 2020-01-04 ENCOUNTER — Encounter: Payer: Self-pay | Admitting: Hematology

## 2020-01-04 ENCOUNTER — Other Ambulatory Visit: Payer: Self-pay

## 2020-01-04 VITALS — BP 138/92 | HR 76 | Temp 97.1°F | Resp 18 | Ht 65.0 in | Wt 158.1 lb

## 2020-01-04 DIAGNOSIS — Z79899 Other long term (current) drug therapy: Secondary | ICD-10-CM | POA: Insufficient documentation

## 2020-01-04 DIAGNOSIS — I2693 Single subsegmental pulmonary embolism without acute cor pulmonale: Secondary | ICD-10-CM

## 2020-01-04 DIAGNOSIS — D631 Anemia in chronic kidney disease: Secondary | ICD-10-CM | POA: Diagnosis not present

## 2020-01-04 DIAGNOSIS — N183 Chronic kidney disease, stage 3 unspecified: Secondary | ICD-10-CM | POA: Diagnosis not present

## 2020-01-04 DIAGNOSIS — D649 Anemia, unspecified: Secondary | ICD-10-CM | POA: Diagnosis not present

## 2020-01-04 DIAGNOSIS — Z7901 Long term (current) use of anticoagulants: Secondary | ICD-10-CM | POA: Diagnosis not present

## 2020-01-04 DIAGNOSIS — C3491 Malignant neoplasm of unspecified part of right bronchus or lung: Secondary | ICD-10-CM

## 2020-01-04 DIAGNOSIS — C3411 Malignant neoplasm of upper lobe, right bronchus or lung: Secondary | ICD-10-CM | POA: Insufficient documentation

## 2020-01-04 DIAGNOSIS — N1831 Chronic kidney disease, stage 3a: Secondary | ICD-10-CM | POA: Diagnosis not present

## 2020-01-04 DIAGNOSIS — C7951 Secondary malignant neoplasm of bone: Secondary | ICD-10-CM | POA: Insufficient documentation

## 2020-01-04 LAB — CMP (CANCER CENTER ONLY)
ALT: 17 U/L (ref 0–44)
AST: 14 U/L — ABNORMAL LOW (ref 15–41)
Albumin: 4.1 g/dL (ref 3.5–5.0)
Alkaline Phosphatase: 59 U/L (ref 38–126)
Anion gap: 8 (ref 5–15)
BUN: 17 mg/dL (ref 8–23)
CO2: 27 mmol/L (ref 22–32)
Calcium: 9.1 mg/dL (ref 8.9–10.3)
Chloride: 106 mmol/L (ref 98–111)
Creatinine: 1.32 mg/dL — ABNORMAL HIGH (ref 0.44–1.00)
GFR, Est AFR Am: 49 mL/min — ABNORMAL LOW (ref >60)
GFR, Estimated: 43 mL/min — ABNORMAL LOW (ref >60)
Glucose, Bld: 98 mg/dL (ref 70–99)
Potassium: 4.1 mmol/L (ref 3.5–5.1)
Sodium: 141 mmol/L (ref 135–145)
Total Bilirubin: 0.3 mg/dL (ref 0.3–1.2)
Total Protein: 7.7 g/dL (ref 6.5–8.1)

## 2020-01-04 LAB — CBC WITH DIFFERENTIAL (CANCER CENTER ONLY)
Abs Immature Granulocytes: 0.04 10*3/uL (ref 0.00–0.07)
Basophils Absolute: 0 10*3/uL (ref 0.0–0.1)
Basophils Relative: 0 %
Eosinophils Absolute: 0.1 10*3/uL (ref 0.0–0.5)
Eosinophils Relative: 1 %
HCT: 37.3 % (ref 36.0–46.0)
Hemoglobin: 11.8 g/dL — ABNORMAL LOW (ref 12.0–15.0)
Immature Granulocytes: 1 %
Lymphocytes Relative: 27 %
Lymphs Abs: 2.3 10*3/uL (ref 0.7–4.0)
MCH: 27.6 pg (ref 26.0–34.0)
MCHC: 31.6 g/dL (ref 30.0–36.0)
MCV: 87.4 fL (ref 80.0–100.0)
Monocytes Absolute: 0.5 10*3/uL (ref 0.1–1.0)
Monocytes Relative: 6 %
Neutro Abs: 5.7 10*3/uL (ref 1.7–7.7)
Neutrophils Relative %: 65 %
Platelet Count: 220 10*3/uL (ref 150–400)
RBC: 4.27 MIL/uL (ref 3.87–5.11)
RDW: 16.6 % — ABNORMAL HIGH (ref 11.5–15.5)
WBC Count: 8.6 10*3/uL (ref 4.0–10.5)
nRBC: 0 % (ref 0.0–0.2)

## 2020-01-04 MED ORDER — DARBEPOETIN ALFA 300 MCG/0.6ML IJ SOSY
PREFILLED_SYRINGE | INTRAMUSCULAR | Status: AC
  Filled 2020-01-04: qty 0.6

## 2020-01-04 NOTE — Progress Notes (Signed)
[facility] OFFICE PROGRESS NOTE  Patient Care Team: Marin Olp, MD as PCP - General (Family Medicine) [name], MD as Medical Oncologist (Oncology) Cordelia Poche, RN as Oncology Nurse Navigator  HEME/ONC OVERVIEW: 1. 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 -Patient started osimertinib on 09/24/2019, and is tolerating it well so far -Improving disease in 11/2019  -I have ordered repeat CT CAP w/ contrast in late 01/2020 to assess interim disease response -Continue Tagrisso '80mg'$  daily  -If CT in 01/2020 shows improving disease, we can monitor disease response with CT CAP q341monthinstead of q2m7montho reduce IV contrast exposure, given her CKD  -PRN anti-emetics: Zofran, Compazine  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  -Review of the scans  showed improving bone metastases, as manifested by sclerosis  -Continue monthly Xgeva -On Ca-Vit D supplement daily and Norco to 7.5/325, q8hrs PRN   Acute LLL PTE and bilateral DVT -Currently on Eliquis; patient denies any abnormal bleeding or bruising -Goal of anticoagulation is lifelong -Continue Xarelto '20mg'$  daily   Normocytic anemia -Most likely multifactorial, including anemia of chronic disease, CKD and treatment-related anemia  -S/p 2 doses of IV iron, last in 10/2019 -Due to persistent anemia requiring RBC transfusion, Aranesp was added in late 10/2019 -Hgb 11.8 today, improving  -Hold Aranesp today; I have reduced Aranesp to 200m34mith future treatments  -We will monitor Hgb monthly and determine the need for Aranesp   Stage III CKD -Cr fluctuating between 1.2 and 1.4 since 2019  -Cr 1.32 today, stable; electrolytes normal -Patient was counseled to avoid NSAIDs  -We will monitor it closely   Orders Placed This Encounter  Procedures  . CT CHEST W CONTRAST    Standing Status:   Future    Standing Expiration Date:   01/03/2021    Scheduling Instructions:     Pls schedule during the week of 01/24/2020    Order Specific Question:   ** REASON FOR EXAM (FREE TEXT)    Answer:   Lung cancer on Tagrisso    Order Specific Question:   If indicated for the ordered procedure, I authorize the administration of contrast media per Radiology protocol    Answer:   Yes    Order Specific Question:   Preferred imaging location?    Answer:   [facility]  Order Specific Question:   Radiology Contrast Protocol - do NOT remove file path    Answer:   \\[email or web address]  . CT ABDOMEN PELVIS W CONTRAST    Standing Status:   Future    Standing Expiration Date:   01/03/2021    Scheduling Instructions:     Pls schedule during the week of 01/24/2020    Order Specific Question:   If indicated for the ordered procedure, I authorize the administration of contrast  media per Radiology protocol    Answer:   Yes    Order Specific Question:   Preferred imaging location?    Answer:   [facility] Specific Question:   Is Oral Contrast requested for this exam?    Answer:   Yes, Per Radiology protocol    Order Specific Question:   Radiology Contrast Protocol - do NOT remove file path    Answer:   \\[email or web address]    The total time spent in the encounter was 45 minutes, including face-to-face time with the patient, review of various tests results, order additional studies/medications, documentation, and coordination of care plan.   All questions were answered. The patient knows to call the clinic with any problems, questions or concerns. No barriers to learning was detected.  Return in 1 month for labs, imaging results and clinic appt.   [name], MD 2/23/202110:51 AM  CHIEF COMPLAINT: "My arms and left hip are still hurting"  INTERVAL HISTORY: Brianna Saunders returns clinic for follow-up of metastatic squamous cell carcinoma of the right lung on Tagrisso.  Patient reports that she has had more pain in her left upper arm as well as left hip, for which she takes 2 tabs of Norco per day with adequate pain control (1/2 tabs x 2 during the day and 1 tab at night).  She denies any constipation.  Her energy level is improving.  Her appetite is good.  She denies any nausea, vomiting, diarrhea, or rash.   REVIEW OF SYSTEMS:   Constitutional: ( - ) fevers, ( - )  chills , ( - ) night sweats Eyes: ( - ) blurriness of vision, ( - ) double vision, ( - ) watery eyes Ears, nose, mouth, throat, and face: ( - ) mucositis, ( - ) sore throat Respiratory: ( - ) cough, ( - ) dyspnea, ( - ) wheezes Cardiovascular: ( - ) palpitation, ( - ) chest discomfort, ( - ) lower extremity swelling Gastrointestinal:  ( - ) nausea, ( - ) heartburn, ( - ) change in bowel habits Skin: ( - ) abnormal skin rashes Lymphatics: ( - ) new lymphadenopathy,  ( - ) easy bruising Neurological: ( - ) numbness, ( - ) tingling, ( - ) new weaknesses Behavioral/Psych: ( - ) mood change, ( - ) new changes  All other systems were reviewed with the patient and are negative.  SUMMARY OF ONCOLOGIC HISTORY: Oncology History  Squamous cell carcinoma of lung, right ([place])  08/31/2019 Initial Diagnosis   Squamous cell carcinoma of lung, right ([place])   08/31/2019 Imaging   CT chest w/ contrast: IMPRESSION: 1. Large poorly defined partially cavitary mass in the right upper lobe measuring approximately 4.1 x 6.4 x 3.8 cm, contiguous with ill-defined mass and/or adenopathy within the mediastinum. 1.9 cm pedunculated filling defect within the right aspect of the left atrium, appears contiguous with mediastinal soft tissue density and is concerning for left atrial invasion. There is narrowing and partial encasement of right  upper lobe pulmonary arteries. 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 2. Small nonocclusive thrombus within subsegmental left lower lobe pulmonary artery. 3. 12 mm irregular right upper lobe pulmonary nodule which is either infectious, inflammatory, or neoplastic. Multifocal ill-defined consolidations in the right upper and middle lobes with diffuse ground-glass density in the right lower lobe, findings could be secondary to pneumonia, with consideration given to associated pulmonary infarcts given occluded appearance of right middle and lower lobe pulmonary arteries. 4. Numerous hypodense liver lesions, some of which are consistent with cysts. Given findings in the chest, recommend correlation with nonemergent MRI. 5. Incompletely visualized age indeterminate fracture  inferior endplate T12 6. Small pericardial effusion   09/01/2019 Imaging   Doppler: Summary: Right: Findings consistent with acute deep vein thrombosis involving the right posterior tibial veins. No cystic structure found in the popliteal fossa. Left: Findings consistent with acute deep vein thrombosis involving the left popliteal vein. No cystic structure found in the popliteal fossa.   09/02/2019 Imaging   MRI brain w/ contrast:IMPRESSION: Negative for metastatic disease to the brain.  No acute abnormality.   09/02/2019 Pathology Results   FINAL MICROSCOPIC DIAGNOSIS:   A. LUNG, RIGHT UPPER LOBE, BIOPSY:  - Benign bronchial epithelium.    09/02/2019 Pathology Results   Clinical History: Transbronchial brushings, RUL  Specimen Submitted:  A. LUNG, RIGHT UPPER LOBE, BRUSHINGS:    FINAL MICROSCOPIC DIAGNOSIS:  - Malignant cells consistent with squamous cell carcinoma    09/02/2019 Imaging   Echocardiogram: IMPRESSIONS      1. Left ventricular ejection fraction, by visual estimation, is 60 to 65%. The left ventricle has normal function. There is mildly increased left ventricular hypertrophy.  2. Left ventricular diastolic Doppler parameters are consistent with impaired relaxation pattern of LV diastolic filling.  3. Global right ventricle has mildly reduced systolic function.The right ventricular size is normal. No increase in right ventricular wall thickness.  4. Left atrial size was normal.  5. Right atrial size was normal.  6. The mitral valve is grossly normal. Trace mitral valve regurgitation.  7. The tricuspid valve is grossly normal. Tricuspid valve regurgitation was not visualized by color flow Doppler.  8. The aortic valve is tricuspid Aortic valve regurgitation was not visualized by color flow Doppler.  9. The pulmonic valve was grossly normal. Pulmonic valve regurgitation is not visualized by color flow Doppler. 10. The inferior vena cava is normal in size with <50%  respiratory variability, suggesting right atrial pressure of 8 mmHg. 11. Small pericardial effusion. 12. The pericardial effusion is RV - inferior wall.   09/02/2019 Imaging   MRI brain: IMPRESSION: Negative for metastatic disease to the brain.  No acute abnormality.    09/03/2019 Imaging   MRI abdomen: IMPRESSION: 1. Severely motion degraded MRI, substantially limiting evaluation. 2. Enhancing L3 vertebral and medial right iliac T2 hypointense bone lesions, concerning for bone metastases. PET-CT strongly recommended on a short term outpatient basis for further characterization. 3. Numerous T2 hyperintense lesions scattered throughout the liver, without definite enhancement on the severely motion degraded postcontrast sequences, presumably benign liver cysts. The liver can be better staged on PET-CT.   09/21/2019 Imaging   PET: IMPRESSION: 5.4 cm medial right upper lobe mass, corresponding to the patient's known primary bronchogenic neoplasm. Associated  direct extension to the right perihilar region and left atrium.   Associated right perihilar and low right paratracheal nodal metastases.   Multifocal right lung opacities, likely reflecting a combination of tumor and postobstructive opacity/infection. Trace right pleural effusion.   Osseous metastases involving the L3 vertebral body, right iliac bone, and left femoral head/neck.   Otherwise, no evidence of metastatic disease in the abdomen/pelvis.     I have reviewed the past medical history, past surgical history, social history and family history with the patient and they are unchanged from previous note.  ALLERGIES:  has No Known Allergies.  MEDICATIONS:  Current Outpatient Medications  Medication Sig Dispense Refill  . albuterol (VENTOLIN HFA) 108 (90 Base) MCG/ACT inhaler INHALE 2 PUFFS BY MOUTH EVERY 6 HOURS AS NEEDED FOR WHEEZING AND SHORTNESS OF BREATH 18 g 2  . amLODipine (NORVASC) 10 MG tablet Take 10 mg  by mouth every morning.    . baclofen (LIORESAL) 10 MG tablet TAKE 1 TABLET BY MOUTH THREE TIMES A DAY AS NEEDED FOR MUSCLE SPASMS (Patient taking differently: Take 10 mg by mouth 3 (three) times daily. ) 270 tablet 1  . benzonatate (TESSALON) 100 MG capsule Take 1 capsule (100 mg total) by mouth 3 (three) times daily as needed for cough. 60 capsule 2  . buPROPion (WELLBUTRIN XL) 150 MG 24 hr tablet TAKE 2 TABLETS (300 MG TOTAL) BY MOUTH DAILY. 180 tablet 1  . cefpodoxime (VANTIN) 100 MG tablet Take 100 mg by mouth every morning.    . chlorhexidine (PERIDEX) 0.12 % solution Rinse with 15 mls twice daily for 30 seconds. Use after breakfast and at bedtime. Spit out excess. Do not swallow. 480 mL prn  . citalopram (CELEXA) 40 MG tablet Take 1 tablet (40 mg total) by mouth daily. 90 tablet 1  . gabapentin (NEURONTIN) 300 MG capsule Take 2 capsules (600 mg total) by mouth 3 (three) times daily. 180 capsule 1  . guaiFENesin-dextromethorphan (ROBITUSSIN DM) 100-10 MG/5ML syrup Take 5 mLs by mouth every 4 (four) hours as needed for cough. 120 mL 1  . HYDROcodone-acetaminophen (NORCO/VICODIN) 5-325 MG tablet Take 1 tablet by mouth every 8 (eight) hours as needed for moderate pain. 30 tablet 0  . lisinopril (ZESTRIL) 40 MG tablet Take 40 mg by mouth every morning.    . metoprolol tartrate (LOPRESSOR) 50 MG tablet Take 1 tablet (50 mg total) by mouth 2 (two) times daily. 180 tablet 1  . nicotine (NICODERM CQ - DOSED IN MG/24 HOURS) 14 mg/24hr patch Place 1 patch (14 mg total) onto the skin daily. 28 patch 1  . ondansetron (ZOFRAN) 8 MG tablet Take 1 tablet (8 mg total) by mouth every 8 (eight) hours as needed for nausea or vomiting. 40 tablet 2  . osimertinib mesylate (TAGRISSO) 80 MG tablet Take 80 mg by mouth every morning.    . prochlorperazine (COMPAZINE) 10 MG tablet Take 1 tablet (10 mg total) by mouth every 8 (eight) hours as needed for nausea or vomiting. 40 tablet 2  . rivaroxaban (XARELTO) 20 MG TABS  tablet Take 1 tablet (20 mg total) by mouth daily with supper. Please start this after you have completed the starter pack. 30 tablet 1  . Vitamin D, Ergocalciferol, (DRISDOL) 1.25 MG (50000 UT) CAPS capsule TAKE 1 CAPSULE (50,000 UNITS TOTAL) BY MOUTH EVERY 7 (SEVEN) DAYS. 12 capsule 0   No current facility-administered medications for this visit.    PHYSICAL EXAMINATION: ECOG PERFORMANCE STATUS: 2 - Symptomatic, <50%  confined to bed  Today's Vitals   01/04/20 1037  BP: (!) 138/92  Pulse: 76  Resp: 18  Temp: (!) 97.1 F (36.2 C)  TempSrc: Temporal  SpO2: 100%  Weight: 158 lb 1.3 oz (71.7 kg)  Height: '5\' 5"'$  (1.651 m)  PainSc: 0-No pain   Body mass index is 26.31 kg/m.  Filed Weights   01/04/20 1037  Weight: 158 lb 1.3 oz (71.7 kg)    GENERAL: alert, no distress and comfortable SKIN: skin color, texture, turgor are normal, no rashes or significant lesions EYES: conjunctiva are pink and non-injected, sclera clear OROPHARYNX: no exudate, no erythema; lips, buccal mucosa, and tongue normal  NECK: supple, non-tender LUNGS: clear to auscultation with normal breathing effort HEART: regular rate & rhythm and no murmurs and no lower extremity edema ABDOMEN: soft, non-tender, non-distended, normal bowel sounds Musculoskeletal: no cyanosis of digits and no clubbing  PSYCH: alert & oriented x 3, fluent speech  LABORATORY DATA:  I have reviewed the data as listed    Component Value Date/Time   NA 141 01/04/2020 0950   K 4.1 01/04/2020 0950   CL 106 01/04/2020 0950   CO2 27 01/04/2020 0950   GLUCOSE 98 01/04/2020 0950   BUN 17 01/04/2020 0950   CREATININE 1.32 (H) 01/04/2020 0950   CALCIUM 9.1 01/04/2020 0950   PROT 7.7 01/04/2020 0950   ALBUMIN 4.1 01/04/2020 0950   AST 14 (L) 01/04/2020 0950   ALT 17 01/04/2020 0950   ALKPHOS 59 01/04/2020 0950   BILITOT 0.3 01/04/2020 0950   GFRNONAA 43 (L) 01/04/2020 0950   GFRAA 49 (L) 01/04/2020 0950    No results found for:  SPEP, UPEP  Lab Results  Component Value Date   WBC 8.6 01/04/2020   NEUTROABS 5.7 01/04/2020   HGB 11.8 (L) 01/04/2020   HCT 37.3 01/04/2020   MCV 87.4 01/04/2020   PLT 220 01/04/2020      Chemistry      Component Value Date/Time   NA 141 01/04/2020 0950   K 4.1 01/04/2020 0950   CL 106 01/04/2020 0950   CO2 27 01/04/2020 0950   BUN 17 01/04/2020 0950   CREATININE 1.32 (H) 01/04/2020 0950      Component Value Date/Time   CALCIUM 9.1 01/04/2020 0950   ALKPHOS 59 01/04/2020 0950   AST 14 (L) 01/04/2020 0950   ALT 17 01/04/2020 0950   BILITOT 0.3 01/04/2020 0950       RADIOGRAPHIC STUDIES: I have personally reviewed the radiological images as listed below and agreed with the findings in the report. CT chest w/ contrast  Result Date: 12/07/2019 CLINICAL DATA:  64 year old female with history of non-small cell lung cancer. EXAM: CT CHEST, ABDOMEN, AND PELVIS WITH CONTRAST TECHNIQUE: Multidetector CT imaging of the chest, abdomen and pelvis was performed following the standard protocol during bolus administration of intravenous contrast. CONTRAST:  162m OMNIPAQUE IOHEXOL 300 MG/ML  SOLN COMPARISON:  PET-CT 09/21/2019. FINDINGS: CT CHEST FINDINGS Cardiovascular: Heart size is normal. There is no significant pericardial fluid, thickening or pericardial calcification. There is aortic atherosclerosis, as well as atherosclerosis of the great vessels of the mediastinum and the coronary arteries, including calcified atherosclerotic plaque in the left anterior descending, left circumflex and right coronary arteries. Mediastinum/Nodes: Prominent soft tissue in the right hilar region likely in part relates to underlying lymphadenopathy, overall measuring 4.1 x 2.5 cm (axial image 29 of series 2). Prominent but nonenlarged low right paratracheal lymph node demonstrates increased enhancement measuring  9 mm in short axis, suspicious but nonspecific. Esophagus is unremarkable in appearance. No  axillary lymphadenopathy. Lungs/Pleura: Previously noted right upper lobe mass is now an irregular-shaped thick-walled cavitary lesion measuring 3.1 x 2.5 cm (axial image 68 of series 6), in direct contact with the previously noted right hilar lesion (discussed above). Surrounding architectural distortion in the right lung with multiple areas of irregular septal thickening and multifocal nodularity, likely to reflect lymphangitic spread of tumor. No acute consolidative airspace disease. No pleural effusions. Musculoskeletal: Orthopedic fixation hardware in the lower cervical spine. There are no aggressive appearing lytic or blastic lesions noted in the visualized portions of the skeleton. CT ABDOMEN PELVIS FINDINGS Hepatobiliary: Multiple well-defined low-attenuation lesions in the liver, largest of which are compatible with simple cysts which measure up to 2.8 x 2.2 cm in segment 4B. Several other subcentimeter low-attenuation lesions scattered throughout the liver are too small to definitively characterize, but the majority of these appear relatively well-defined favored to represent small cysts and/or biliary hamartomas. No definite suspicious hepatic lesions are confidently identified. No intra or extrahepatic biliary ductal dilatation. Gallbladder is normal in appearance. Pancreas: No pancreatic mass. No pancreatic ductal dilatation. No pancreatic or peripancreatic fluid collections or inflammatory changes. Spleen: Unremarkable. Adrenals/Urinary Tract: Severe atrophy of the right kidney. Subcentimeter low-attenuation lesion in the lower pole the left kidney, too small to characterize, but statistically likely to represent a cyst. Bilateral adrenal glands are normal in appearance. No hydroureteronephrosis. Urinary bladder is normal in appearance. Stomach/Bowel: Normal appearance of the stomach. No pathologic dilatation of small bowel or colon. Normal appendix. Vascular/Lymphatic: Aortic atherosclerosis, without  evidence of aneurysm or dissection in the abdominal or pelvic vasculature. No lymphadenopathy noted in the abdomen or pelvis. Reproductive: Status post hysterectomy. Ovaries are not confidently identified may be surgically absent or atrophic. Other: No significant volume of ascites.  No pneumoperitoneum. Musculoskeletal: Multiple sclerotic lesions are noted, most evident in the L3 vertebral body, right ilium adjacent to the sacroiliac joint, and in the left femoral head, similar to prior PET-CT, likely to reflect healing treated metastases. IMPRESSION: 1. Positive response to therapy with partial regression of right upper lobe mass which is now a thick-walled cavitary lesion, with persistent evidence of adjacent lymphangitic spread of tumor throughout the right lung as well as persistent right hilar lymphadenopathy which appears slightly decreased in size compared to the prior examination. Multiple sclerotic metastatic lesions in the visualized axial and appendicular skeleton again noted. No new extra skeletal metastatic disease noted in the abdomen or pelvis. 2. Aortic atherosclerosis, in addition to 3 vessel coronary artery disease. Please note that although the presence of coronary artery calcium documents the presence of coronary artery disease, the severity of this disease and any potential stenosis cannot be assessed on this non-gated CT examination. Assessment for potential risk factor modification, dietary therapy or pharmacologic therapy may be warranted, if clinically indicated. 3. Additional incidental findings, as above. Electronically Signed   By: Vinnie Langton M.D.   On: 12/07/2019 11:06   CT AP w/ contrast  Result Date: 12/07/2019 CLINICAL DATA:  64 year old female with history of non-small cell lung cancer. EXAM: CT CHEST, ABDOMEN, AND PELVIS WITH CONTRAST TECHNIQUE: Multidetector CT imaging of the chest, abdomen and pelvis was performed following the standard protocol during bolus  administration of intravenous contrast. CONTRAST:  139m OMNIPAQUE IOHEXOL 300 MG/ML  SOLN COMPARISON:  PET-CT 09/21/2019. FINDINGS: CT CHEST FINDINGS Cardiovascular: Heart size is normal. There is no significant pericardial fluid, thickening or pericardial  calcification. There is aortic atherosclerosis, as well as atherosclerosis of the great vessels of the mediastinum and the coronary arteries, including calcified atherosclerotic plaque in the left anterior descending, left circumflex and right coronary arteries. Mediastinum/Nodes: Prominent soft tissue in the right hilar region likely in part relates to underlying lymphadenopathy, overall measuring 4.1 x 2.5 cm (axial image 29 of series 2). Prominent but nonenlarged low right paratracheal lymph node demonstrates increased enhancement measuring 9 mm in short axis, suspicious but nonspecific. Esophagus is unremarkable in appearance. No axillary lymphadenopathy. Lungs/Pleura: Previously noted right upper lobe mass is now an irregular-shaped thick-walled cavitary lesion measuring 3.1 x 2.5 cm (axial image 68 of series 6), in direct contact with the previously noted right hilar lesion (discussed above). Surrounding architectural distortion in the right lung with multiple areas of irregular septal thickening and multifocal nodularity, likely to reflect lymphangitic spread of tumor. No acute consolidative airspace disease. No pleural effusions. Musculoskeletal: Orthopedic fixation hardware in the lower cervical spine. There are no aggressive appearing lytic or blastic lesions noted in the visualized portions of the skeleton. CT ABDOMEN PELVIS FINDINGS Hepatobiliary: Multiple well-defined low-attenuation lesions in the liver, largest of which are compatible with simple cysts which measure up to 2.8 x 2.2 cm in segment 4B. Several other subcentimeter low-attenuation lesions scattered throughout the liver are too small to definitively characterize, but the majority of  these appear relatively well-defined favored to represent small cysts and/or biliary hamartomas. No definite suspicious hepatic lesions are confidently identified. No intra or extrahepatic biliary ductal dilatation. Gallbladder is normal in appearance. Pancreas: No pancreatic mass. No pancreatic ductal dilatation. No pancreatic or peripancreatic fluid collections or inflammatory changes. Spleen: Unremarkable. Adrenals/Urinary Tract: Severe atrophy of the right kidney. Subcentimeter low-attenuation lesion in the lower pole the left kidney, too small to characterize, but statistically likely to represent a cyst. Bilateral adrenal glands are normal in appearance. No hydroureteronephrosis. Urinary bladder is normal in appearance. Stomach/Bowel: Normal appearance of the stomach. No pathologic dilatation of small bowel or colon. Normal appendix. Vascular/Lymphatic: Aortic atherosclerosis, without evidence of aneurysm or dissection in the abdominal or pelvic vasculature. No lymphadenopathy noted in the abdomen or pelvis. Reproductive: Status post hysterectomy. Ovaries are not confidently identified may be surgically absent or atrophic. Other: No significant volume of ascites.  No pneumoperitoneum. Musculoskeletal: Multiple sclerotic lesions are noted, most evident in the L3 vertebral body, right ilium adjacent to the sacroiliac joint, and in the left femoral head, similar to prior PET-CT, likely to reflect healing treated metastases. IMPRESSION: 1. Positive response to therapy with partial regression of right upper lobe mass which is now a thick-walled cavitary lesion, with persistent evidence of adjacent lymphangitic spread of tumor throughout the right lung as well as persistent right hilar lymphadenopathy which appears slightly decreased in size compared to the prior examination. Multiple sclerotic metastatic lesions in the visualized axial and appendicular skeleton again noted. No new extra skeletal metastatic disease  noted in the abdomen or pelvis. 2. Aortic atherosclerosis, in addition to 3 vessel coronary artery disease. Please note that although the presence of coronary artery calcium documents the presence of coronary artery disease, the severity of this disease and any potential stenosis cannot be assessed on this non-gated CT examination. Assessment for potential risk factor modification, dietary therapy or pharmacologic therapy may be warranted, if clinically indicated. 3. Additional incidental findings, as above. Electronically Signed   By: Vinnie Langton M.D.   On: 12/07/2019 11:06

## 2020-01-04 NOTE — Progress Notes (Signed)
Patient needs CT scans prior to next appointment. Scans scheduled.  Called and spoke to patient's daughter, Adonis Huguenin. Reviewed appointment date, time and location. Also reviewed CT prep with her. Katrina knows to come by radiology prior to the appointment date to pick up contrast.   Letter with above instructions also mailed to patient home.

## 2020-01-05 ENCOUNTER — Telehealth: Payer: Self-pay | Admitting: *Deleted

## 2020-01-05 NOTE — Telephone Encounter (Signed)
Called patient to see when she can come back and get her Xgeva injection. She didn't need the Aranesp injection today. Patient stated,"I'll have to call you back because my daughter brings me." I told her to call the office and leave a message. She verbalized understanding.

## 2020-01-10 ENCOUNTER — Other Ambulatory Visit (HOSPITAL_COMMUNITY)
Admission: RE | Admit: 2020-01-10 | Discharge: 2020-01-10 | Disposition: A | Discharge status: Home / Self Care | Payer: Medicare Other | Source: Ambulatory Visit | Attending: Acute Care | Admitting: Acute Care

## 2020-01-10 DIAGNOSIS — Z20822 Contact with and (suspected) exposure to covid-19: Secondary | ICD-10-CM | POA: Insufficient documentation

## 2020-01-10 DIAGNOSIS — Z01812 Encounter for preprocedural laboratory examination: Secondary | ICD-10-CM | POA: Diagnosis not present

## 2020-01-11 LAB — SARS CORONAVIRUS 2 (TAT 6-24 HRS): SARS Coronavirus 2: NEGATIVE

## 2020-01-13 ENCOUNTER — Ambulatory Visit (INDEPENDENT_AMBULATORY_CARE_PROVIDER_SITE_OTHER): Payer: Medicare Other | Admitting: Emergency Medicine

## 2020-01-13 ENCOUNTER — Ambulatory Visit (INDEPENDENT_AMBULATORY_CARE_PROVIDER_SITE_OTHER): Payer: Medicare Other | Admitting: Adult Health

## 2020-01-13 ENCOUNTER — Encounter: Payer: Self-pay | Admitting: Adult Health

## 2020-01-13 ENCOUNTER — Other Ambulatory Visit: Payer: Self-pay

## 2020-01-13 VITALS — BP 112/66 | HR 112 | Temp 97.6°F | Ht 65.0 in | Wt 159.0 lb

## 2020-01-13 DIAGNOSIS — I2693 Single subsegmental pulmonary embolism without acute cor pulmonale: Secondary | ICD-10-CM

## 2020-01-13 DIAGNOSIS — C3491 Malignant neoplasm of unspecified part of right bronchus or lung: Secondary | ICD-10-CM

## 2020-01-13 DIAGNOSIS — G4710 Hypersomnia, unspecified: Secondary | ICD-10-CM | POA: Diagnosis not present

## 2020-01-13 DIAGNOSIS — R0600 Dyspnea, unspecified: Secondary | ICD-10-CM | POA: Insufficient documentation

## 2020-01-13 DIAGNOSIS — R0602 Shortness of breath: Secondary | ICD-10-CM

## 2020-01-13 DIAGNOSIS — F17200 Nicotine dependence, unspecified, uncomplicated: Secondary | ICD-10-CM

## 2020-01-13 DIAGNOSIS — G4733 Obstructive sleep apnea (adult) (pediatric): Secondary | ICD-10-CM | POA: Diagnosis not present

## 2020-01-13 LAB — PULMONARY FUNCTION TEST
DL/VA % pred: 95 %
DL/VA: 3.96 ml/min/mmHg/L
DLCO unc % pred: 60 %
DLCO unc: 12.43 ml/min/mmHg
FEF 25-75 Post: 1.97 L/sec
FEF 25-75 Pre: 2.42 L/sec
FEF2575-%Change-Post: -18 %
FEF2575-%Pred-Post: 98 %
FEF2575-%Pred-Pre: 121 %
FEV1-%Change-Post: -1 %
FEV1-%Pred-Post: 93 %
FEV1-%Pred-Pre: 95 %
FEV1-Post: 1.95 L
FEV1-Pre: 1.99 L
FEV1FVC-%Change-Post: 0 %
FEV1FVC-%Pred-Pre: 107 %
FEV6-%Change-Post: 0 %
FEV6-%Pred-Post: 89 %
FEV6-%Pred-Pre: 90 %
FEV6-Post: 2.31 L
FEV6-Pre: 2.33 L
FEV6FVC-%Pred-Post: 103 %
FEV6FVC-%Pred-Pre: 103 %
FVC-%Change-Post: -2 %
FVC-%Pred-Post: 86 %
FVC-%Pred-Pre: 88 %
FVC-Post: 2.31 L
FVC-Pre: 2.37 L
Post FEV1/FVC ratio: 84 %
Post FEV6/FVC ratio: 100 %
Pre FEV1/FVC ratio: 84 %
Pre FEV6/FVC Ratio: 100 %
RV % pred: 80 %
RV: 1.71 L
TLC % pred: 78 %
TLC: 4.08 L

## 2020-01-13 NOTE — Progress Notes (Signed)
Full PFT performed today. °

## 2020-01-13 NOTE — Assessment & Plan Note (Signed)
Continue follow-up with oncology as planned

## 2020-01-13 NOTE — Patient Instructions (Addendum)
So glad you quit smoking.  Ventolin As needed  Wheezing /shortness of breath .  Activity as tolerated.  Follow up with Oncology as planned.  Set up for Home Sleep Study .  Follow up with Dr. Lamonte Sakai in 3 months and As needed

## 2020-01-13 NOTE — Assessment & Plan Note (Signed)
Patient congratulated on smoking cessation.

## 2020-01-13 NOTE — Progress Notes (Signed)
@Patient  ID: Brianna Saunders, female    DOB: [birth year], 64 y.o.   MRN: [id number]  Chief Complaint  Patient presents with  . 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 Multiple sclerotic metastatic lesions in the visualized axial and appendicular skeleton again noted. No new extra skeletal metastatic disease noted in the abdomen or pelvis.)  01/13/2020 Follow up : Lung Cancer, PE  Patient presents for a 63-month follow-up.  Patient has known metastatic stage IV squamous cell lung cancer.  She is currently following with oncology and is undergoing active treatment.  This was diagnosed in October 2020.  CT chest showed a large right lung mass and pulmonary embolism with bilateral DVTs.  Patient remains on Xarelto daily. Patient says overall she does not have any significant cough or shortness of breath at rest.  She says she has very low energy and gets winded with activity.  She is very sedentary.  PFTs were done today and show airflow  obstruction or restriction.  DLCO is decreased at 60%.  B1 93%, ratio 84, FVC 86%, no significant bronchodilator response. Patient denies any chest pain orthopnea PND l or increased swelling  No Known Allergies  Immunization History  Administered Date(s) Administered  . Pneumococcal Polysaccharide-23 04/02/2017    Past Medical History:  Diagnosis Date  . Chicken pox   . Hypertension     Tobacco History: Social History   Tobacco Use  Smoking Status Former Smoker  . Packs/day: 0.50  . Years: 45.00  . Pack years: 22.50  . Types: Cigarettes  . Start date: 11/11/1969  . Quit date: 09/01/2019  . Years since quitting: 0.3  Smokeless Tobacco Never Used  Tobacco Comment   2 cigarettes a day   Counseling given: Not Answered Comment: 2 cigarettes a day   Outpatient Medications Prior to Visit  Medication Sig Dispense Refill  . albuterol (VENTOLIN HFA) 108 (90 Base) MCG/ACT inhaler INHALE 2 PUFFS BY MOUTH EVERY 6 HOURS AS NEEDED FOR WHEEZING AND SHORTNESS OF BREATH 18 g 2  . amLODipine (NORVASC) 10 MG tablet Take 10 mg by mouth every morning.    . baclofen (LIORESAL) 10 MG tablet TAKE 1 TABLET BY MOUTH THREE TIMES A DAY AS NEEDED FOR MUSCLE SPASMS (Patient taking differently: Take 10 mg by mouth 3 (three) times daily. ) 270 tablet 1  . benzonatate (TESSALON) 100 MG capsule Take 1 capsule (100 mg total) by mouth 3 (three) times daily as needed for cough. 60 capsule 2  . buPROPion (WELLBUTRIN XL) 150 MG 24 hr tablet TAKE 2 TABLETS (300 MG TOTAL) BY MOUTH DAILY. 180 tablet 1  .  cefpodoxime (VANTIN) 100 MG tablet Take 100 mg by mouth every morning.    . chlorhexidine (PERIDEX) 0.12 % solution Rinse with 15 mls twice daily for 30 seconds. Use after breakfast and at bedtime. Spit out excess. Do not swallow. 480 mL prn  . citalopram (CELEXA) 40 MG tablet Take 1 tablet (40 mg total) by mouth daily. 90 tablet 1  . gabapentin (NEURONTIN) 300 MG capsule Take 2 capsules (600 mg total) by mouth 3  (three) times daily. 180 capsule 1  . guaiFENesin-dextromethorphan (ROBITUSSIN DM) 100-10 MG/5ML syrup Take 5 mLs by mouth every 4 (four) hours as needed for cough. 120 mL 1  . HYDROcodone-acetaminophen (NORCO/VICODIN) 5-325 MG tablet Take 1 tablet by mouth every 8 (eight) hours as needed for moderate pain. 30 tablet 0  . lisinopril (ZESTRIL) 40 MG tablet Take 40 mg by mouth every morning.    . metoprolol tartrate (LOPRESSOR) 50 MG tablet Take 1 tablet (50 mg total) by mouth 2 (two) times daily. 180 tablet 1  . nicotine (NICODERM CQ - DOSED IN MG/24 HOURS) 14 mg/24hr patch Place 1 patch (14 mg total) onto the skin daily. 28 patch 1  . ondansetron (ZOFRAN) 8 MG tablet Take 1 tablet (8 mg total) by mouth every 8 (eight) hours as needed for nausea or vomiting. 40 tablet 2  . osimertinib mesylate (TAGRISSO) 80 MG tablet Take 80 mg by mouth every morning.    . prochlorperazine (COMPAZINE) 10 MG tablet Take 1 tablet (10 mg total) by mouth every 8 (eight) hours as needed for nausea or vomiting. 40 tablet 2  . rivaroxaban (XARELTO) 20 MG TABS tablet Take 1 tablet (20 mg total) by mouth daily with supper. Please start this after you have completed the starter pack. 30 tablet 1  . Vitamin D, Ergocalciferol, (DRISDOL) 1.25 MG (50000 UT) CAPS capsule TAKE 1 CAPSULE (50,000 UNITS TOTAL) BY MOUTH EVERY 7 (SEVEN) DAYS. 12 capsule 0   No facility-administered medications prior to visit.     Review of Systems:   Constitutional:   No  weight loss, night sweats,  Fevers, chills, + fatigue, or  lassitude.  HEENT:   No headaches,  Difficulty swallowing,  Tooth/dental problems, or  Sore throat,                No sneezing, itching, ear ache, nasal congestion, post nasal drip,   CV:  No chest pain,  Orthopnea, PND, swelling in lower extremities, anasarca, dizziness, palpitations, syncope.   GI  No heartburn, indigestion, abdominal pain, nausea, vomiting, diarrhea, change in bowel habits, loss of appetite, bloody  stools.   Resp:   No chest wall deformity  Skin: no rash or lesions.  GU: no dysuria, change in color of urine, no urgency or frequency.  No flank pain, no hematuria   MS:  No joint pain or swelling.  No decreased range of motion.  No back pain.    Physical Exam  BP 112/66 (BP Location: Left Arm, Cuff Size: Normal)   Pulse (!) 112   Temp 97.6 F (36.4 C) (Temporal)   Ht 5\' 5"  (1.651 m)   Wt 159 lb (72.1 kg)   SpO2 97% Comment: RA  BMI 26.46 kg/m   GEN: A/Ox3; pleasant , NAD, wheelchair    HEENT:  /AT,   NOSE-clear, THROAT-clear, no lesions, no postnasal drip or exudate noted.   NECK:  Supple w/ fair ROM; no JVD; normal carotid impulses w/o bruits; no thyromegaly or nodules palpated; no lymphadenopathy.  RESP  Decreased BS in bases ,   no accessory muscle use, no dullness to percussion  CARD:  RRR, no m/r/g, tr  peripheral edema, pulses intact, no cyanosis or clubbing.  GI:   Soft & nt; nml bowel sounds; no organomegaly or masses detected.   Musco: Warm bil, no deformities or joint swelling noted.   Neuro: alert, no focal deficits noted.    Skin: Warm, no lesions or rashes    Lab Results:   Imaging: No results found.  Darbepoetin Alfa (ARANESP) injection 300 mcg    Date Action Dose Route User   12/07/2019 1155 Given 300 mcg Subcutaneous (Left Lower Abdomen) Amelia Jo I, RN    denosumab (XGEVA) injection 120 mg    Date Action Dose Route User   12/07/2019 1151 Given 120 mg Subcutaneous (Left Lower Abdomen) Amelia Jo I, RN      PFT Results Latest Ref Rng & Units 01/13/2020  FVC-Pre L 2.37  FVC-Predicted Pre % 88  FVC-Post L 2.31  FVC-Predicted Post % 86  Pre FEV1/FVC % % 84  Post FEV1/FCV % % 84  FEV1-Pre L 1.99  FEV1-Predicted Pre % 95  FEV1-Post L 1.95  DLCO UNC% % 60  DLCO COR %Predicted % 95  TLC L 4.08  TLC % Predicted % 78  RV % Predicted % 80    No results found for: NITRICOXIDE      Assessment & Plan:   Squamous cell  carcinoma of lung, right (HCC) Continue follow-up with oncology as planned  Pulmonary embolism [facility]) PE and DVT diagnosed October 2020.  Patient has active underlying malignancy.  Continue on Xarelto  Current smoker Patient congratulated on smoking cessation.  Dyspnea Dyspnea suspect is multifactorial.  With underlying deconditioning undergoing active treatment for malignancy.  Despite smoking patient has preserved lung function with no airflow obstruction or restriction on PFTs.  She does have a decreased DLCO.  May use albuterol as needed.Rexene Edison, NP 01/13/2020

## 2020-01-13 NOTE — Assessment & Plan Note (Signed)
Dyspnea suspect is multifactorial.  With underlying deconditioning undergoing active treatment for malignancy.  Despite smoking patient has preserved lung function with no airflow obstruction or restriction on PFTs.  She does have a decreased DLCO.  May use albuterol as needed.Brianna Saunders

## 2020-01-13 NOTE — Assessment & Plan Note (Signed)
PE and DVT diagnosed October 2020.  Patient has active underlying malignancy.  Continue on Xarelto

## 2020-01-13 NOTE — Addendum Note (Signed)
Addended by: Tery Sanfilippo R on: 01/13/2020 10:44 AM   Modules accepted: Orders

## 2020-01-14 ENCOUNTER — Other Ambulatory Visit: Payer: Self-pay | Admitting: Hematology

## 2020-01-14 DIAGNOSIS — C3491 Malignant neoplasm of unspecified part of right bronchus or lung: Secondary | ICD-10-CM

## 2020-01-17 ENCOUNTER — Other Ambulatory Visit: Payer: Self-pay | Admitting: *Deleted

## 2020-01-17 DIAGNOSIS — C3491 Malignant neoplasm of unspecified part of right bronchus or lung: Secondary | ICD-10-CM

## 2020-01-17 MED ORDER — HYDROCODONE-ACETAMINOPHEN 5-325 MG PO TABS: 1.0000 | ORAL_TABLET | Freq: Three times a day (TID) | ORAL | 0 refills | Status: DC | PRN

## 2020-01-21 ENCOUNTER — Other Ambulatory Visit: Payer: Self-pay | Admitting: Family Medicine

## 2020-01-25 ENCOUNTER — Telehealth: Payer: Self-pay | Admitting: Family Medicine

## 2020-01-25 ENCOUNTER — Encounter (HOSPITAL_BASED_OUTPATIENT_CLINIC_OR_DEPARTMENT_OTHER): Payer: Self-pay

## 2020-01-25 ENCOUNTER — Ambulatory Visit (HOSPITAL_BASED_OUTPATIENT_CLINIC_OR_DEPARTMENT_OTHER)
Admission: RE | Admit: 2020-01-25 | Discharge: 2020-01-25 | Disposition: A | Discharge status: Home / Self Care | Payer: Medicare Other | Source: Ambulatory Visit | Attending: Hematology | Admitting: Hematology

## 2020-01-25 ENCOUNTER — Other Ambulatory Visit: Payer: Self-pay

## 2020-01-25 DIAGNOSIS — C3491 Malignant neoplasm of unspecified part of right bronchus or lung: Secondary | ICD-10-CM

## 2020-01-25 DIAGNOSIS — N2889 Other specified disorders of kidney and ureter: Secondary | ICD-10-CM | POA: Diagnosis not present

## 2020-01-25 DIAGNOSIS — N261 Atrophy of kidney (terminal): Secondary | ICD-10-CM | POA: Diagnosis not present

## 2020-01-25 DIAGNOSIS — R918 Other nonspecific abnormal finding of lung field: Secondary | ICD-10-CM | POA: Diagnosis not present

## 2020-01-25 HISTORY — DX: Malignant (primary) neoplasm, unspecified: C80.1

## 2020-01-25 MED ORDER — IOHEXOL 300 MG/ML  SOLN
100.0000 mL | Freq: Once | INTRAMUSCULAR | Status: AC | PRN
  Administered 2020-01-25: 100 mL via INTRAVENOUS

## 2020-01-25 NOTE — Telephone Encounter (Signed)
Patient's daughter called in and asked for an appointment for joint pain and possibly a referral to a specialist, okay to use a same day?

## 2020-01-25 NOTE — Telephone Encounter (Signed)
Yes, or we can just enter the referral if she would like.

## 2020-01-26 ENCOUNTER — Other Ambulatory Visit: Payer: Self-pay

## 2020-01-26 DIAGNOSIS — M2550 Pain in unspecified joint: Secondary | ICD-10-CM

## 2020-01-26 NOTE — Telephone Encounter (Signed)
Referral placed.

## 2020-01-26 NOTE — Telephone Encounter (Signed)
Called patient's daughter and she asked for a referral for an orthopedic for her mother's joint pain.

## 2020-02-01 ENCOUNTER — Encounter: Payer: Self-pay | Admitting: Hematology

## 2020-02-01 ENCOUNTER — Inpatient Hospital Stay: Payer: Medicare Other

## 2020-02-01 ENCOUNTER — Encounter: Payer: Self-pay | Admitting: Certified Nurse Midwife

## 2020-02-01 ENCOUNTER — Inpatient Hospital Stay: Payer: Medicare Other | Attending: Hematology

## 2020-02-01 ENCOUNTER — Encounter: Payer: Self-pay | Admitting: *Deleted

## 2020-02-01 ENCOUNTER — Other Ambulatory Visit: Payer: Self-pay

## 2020-02-01 ENCOUNTER — Inpatient Hospital Stay (HOSPITAL_BASED_OUTPATIENT_CLINIC_OR_DEPARTMENT_OTHER): Payer: Medicare Other | Admitting: Hematology

## 2020-02-01 VITALS — BP 143/73 | HR 99 | Temp 97.4°F | Resp 18 | Ht 65.0 in | Wt 162.1 lb

## 2020-02-01 DIAGNOSIS — N1831 Chronic kidney disease, stage 3a: Secondary | ICD-10-CM

## 2020-02-01 DIAGNOSIS — C3491 Malignant neoplasm of unspecified part of right bronchus or lung: Secondary | ICD-10-CM | POA: Diagnosis not present

## 2020-02-01 DIAGNOSIS — C3411 Malignant neoplasm of upper lobe, right bronchus or lung: Secondary | ICD-10-CM | POA: Insufficient documentation

## 2020-02-01 DIAGNOSIS — D649 Anemia, unspecified: Secondary | ICD-10-CM

## 2020-02-01 DIAGNOSIS — I2693 Single subsegmental pulmonary embolism without acute cor pulmonale: Secondary | ICD-10-CM

## 2020-02-01 DIAGNOSIS — C7951 Secondary malignant neoplasm of bone: Secondary | ICD-10-CM | POA: Diagnosis not present

## 2020-02-01 LAB — CMP (CANCER CENTER ONLY)
ALT: 22 U/L (ref 0–44)
AST: 16 U/L (ref 15–41)
Albumin: 4.1 g/dL (ref 3.5–5.0)
Alkaline Phosphatase: 65 U/L (ref 38–126)
Anion gap: 8 (ref 5–15)
BUN: 16 mg/dL (ref 8–23)
CO2: 26 mmol/L (ref 22–32)
Calcium: 9.3 mg/dL (ref 8.9–10.3)
Chloride: 106 mmol/L (ref 98–111)
Creatinine: 1.25 mg/dL — ABNORMAL HIGH (ref 0.44–1.00)
GFR, Est AFR Am: 53 mL/min — ABNORMAL LOW (ref >60)
GFR, Estimated: 45 mL/min — ABNORMAL LOW (ref >60)
Glucose, Bld: 97 mg/dL (ref 70–99)
Potassium: 4.4 mmol/L (ref 3.5–5.1)
Sodium: 140 mmol/L (ref 135–145)
Total Bilirubin: 0.3 mg/dL (ref 0.3–1.2)
Total Protein: 7.7 g/dL (ref 6.5–8.1)

## 2020-02-01 LAB — CBC WITH DIFFERENTIAL (CANCER CENTER ONLY)
Abs Immature Granulocytes: 0.02 10*3/uL (ref 0.00–0.07)
Basophils Absolute: 0 10*3/uL (ref 0.0–0.1)
Basophils Relative: 0 %
Eosinophils Absolute: 0.1 10*3/uL (ref 0.0–0.5)
Eosinophils Relative: 1 %
HCT: 37 % (ref 36.0–46.0)
Hemoglobin: 11.6 g/dL — ABNORMAL LOW (ref 12.0–15.0)
Immature Granulocytes: 0 %
Lymphocytes Relative: 27 %
Lymphs Abs: 2 10*3/uL (ref 0.7–4.0)
MCH: 27.4 pg (ref 26.0–34.0)
MCHC: 31.4 g/dL (ref 30.0–36.0)
MCV: 87.3 fL (ref 80.0–100.0)
Monocytes Absolute: 0.5 10*3/uL (ref 0.1–1.0)
Monocytes Relative: 8 %
Neutro Abs: 4.6 10*3/uL (ref 1.7–7.7)
Neutrophils Relative %: 64 %
Platelet Count: 253 10*3/uL (ref 150–400)
RBC: 4.24 MIL/uL (ref 3.87–5.11)
RDW: 17.4 % — ABNORMAL HIGH (ref 11.5–15.5)
WBC Count: 7.2 10*3/uL (ref 4.0–10.5)
nRBC: 0 % (ref 0.0–0.2)

## 2020-02-01 MED ORDER — HYDROCODONE-ACETAMINOPHEN 5-325 MG PO TABS: 1.0000 | ORAL_TABLET | Freq: Three times a day (TID) | ORAL | 0 refills | Status: DC | PRN

## 2020-02-01 MED ORDER — DENOSUMAB 120 MG/1.7ML ~~LOC~~ SOLN
120.0000 mg | Freq: Once | SUBCUTANEOUS | Status: AC
  Administered 2020-02-01: 120 mg via SUBCUTANEOUS

## 2020-02-01 MED ORDER — DENOSUMAB 120 MG/1.7ML ~~LOC~~ SOLN
SUBCUTANEOUS | Status: AC
  Filled 2020-02-01: qty 1.7

## 2020-02-01 NOTE — Patient Instructions (Signed)
Denosumab injection What is this medicine? DENOSUMAB (den oh sue mab) slows bone breakdown. Prolia is used to treat osteoporosis in women after menopause and in men, and in people who are taking corticosteroids for 6 months or more. Xgeva is used to treat a high calcium level due to cancer and to prevent bone fractures and other bone problems caused by multiple myeloma or cancer bone metastases. Xgeva is also used to treat giant cell tumor of the bone. This medicine may be used for other purposes; ask your health care provider or pharmacist if you have questions. COMMON BRAND NAME(S): Prolia, XGEVA What should I tell my health care provider before I take this medicine? They need to know if you have any of these conditions:  dental disease  having surgery or tooth extraction  infection  kidney disease  low levels of calcium or Vitamin D in the blood  malnutrition  on hemodialysis  skin conditions or sensitivity  thyroid or parathyroid disease  an unusual reaction to denosumab, other medicines, foods, dyes, or preservatives  pregnant or trying to get pregnant  breast-feeding How should I use this medicine? This medicine is for injection under the skin. It is given by a health care professional in a hospital or clinic setting. A special MedGuide will be given to you before each treatment. Be sure to read this information carefully each time. For Prolia, talk to your pediatrician regarding the use of this medicine in children. Special care may be needed. For Xgeva, talk to your pediatrician regarding the use of this medicine in children. While this drug may be prescribed for children as young as 13 years for selected conditions, precautions do apply. Overdosage: If you think you have taken too much of this medicine contact a poison control center or emergency room at once. NOTE: This medicine is only for you. Do not share this medicine with others. What if I miss a dose? It is  important not to miss your dose. Call your doctor or health care professional if you are unable to keep an appointment. What may interact with this medicine? Do not take this medicine with any of the following medications:  other medicines containing denosumab This medicine may also interact with the following medications:  medicines that lower your chance of fighting infection  steroid medicines like prednisone or cortisone This list may not describe all possible interactions. Give your health care provider a list of all the medicines, herbs, non-prescription drugs, or dietary supplements you use. Also tell them if you smoke, drink alcohol, or use illegal drugs. Some items may interact with your medicine. What should I watch for while using this medicine? Visit your doctor or health care professional for regular checks on your progress. Your doctor or health care professional may order blood tests and other tests to see how you are doing. Call your doctor or health care professional for advice if you get a fever, chills or sore throat, or other symptoms of a cold or flu. Do not treat yourself. This drug may decrease your body's ability to fight infection. Try to avoid being around people who are sick. You should make sure you get enough calcium and vitamin D while you are taking this medicine, unless your doctor tells you not to. Discuss the foods you eat and the vitamins you take with your health care professional. See your dentist regularly. Brush and floss your teeth as directed. Before you have any dental work done, tell your dentist you are   receiving this medicine. Do not become pregnant while taking this medicine or for 5 months after stopping it. Talk with your doctor or health care professional about your birth control options while taking this medicine. Women should inform their doctor if they wish to become pregnant or think they might be pregnant. There is a potential for serious side  effects to an unborn child. Talk to your health care professional or pharmacist for more information. What side effects may I notice from receiving this medicine? Side effects that you should report to your doctor or health care professional as soon as possible:  allergic reactions like skin rash, itching or hives, swelling of the face, lips, or tongue  bone pain  breathing problems  dizziness  jaw pain, especially after dental work  redness, blistering, peeling of the skin  signs and symptoms of infection like fever or chills; cough; sore throat; pain or trouble passing urine  signs of low calcium like fast heartbeat, muscle cramps or muscle pain; pain, tingling, numbness in the hands or feet; seizures  unusual bleeding or bruising  unusually weak or tired Side effects that usually do not require medical attention (report to your doctor or health care professional if they continue or are bothersome):  constipation  diarrhea  headache  joint pain  loss of appetite  muscle pain  runny nose  tiredness  upset stomach This list may not describe all possible side effects. Call your doctor for medical advice about side effects. You may report side effects to FDA at 1-800-FDA-1088. Where should I keep my medicine? This medicine is only given in a clinic, doctor's office, or other health care setting and will not be stored at home. NOTE: This sheet is a summary. It may not cover all possible information. If you have questions about this medicine, talk to your doctor, pharmacist, or health care provider.  2020 [employer]vier/Gold Standard (2018-03-06 16:10:44)

## 2020-02-01 NOTE — Progress Notes (Signed)
[place] OFFICE PROGRESS NOTE  Patient Care Team: Marin Olp, MD as PCP - General (Family Medicine) [name], MD as Medical Oncologist (Oncology) Cordelia Poche, RN as Oncology Nurse Navigator  HEME/ONC OVERVIEW: 1. 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 LLL PTE and bilateral LE DVT -PTE in the LLL, as well as acute DVT in R posterior tibial and L popliteal veins in 08/2019  -08/2019 - present: Xarelto 243mdaily   TREATMENT SUMMARY:  08/2019 - present: Xarelto 2011maily  09/24/2019 - present: osimertinib 47m69mily   11/10/2019 - present: q4weMarchelle Folks2/30/2020 - 12/07/2019: q4weeks Aranesp due to CKD and treatment-related anemia, currently 200mc52mPRN IV iron, last dose on 10/12/2019   ASSESSMENT & PLAN:   Stage IV (cT4N([id number] cell carcinoma of the R upper lung, EGFR exon 19 deletion -Patient started osimertinib on 09/24/2019, and is tolerating it well so far -I independently reviewed the radiologic images of recent CT chest, abdomen and pelvis, and agree with findings documented.  In summary, CT showed improving disease, including thoracic lymphadenopathy.  There was no evidence of progressive or new metastatic disease. 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  -Review of the scans showed improving bone metastases, as manifested by sclerosis  -Currently taking Norco ~1x/day, pain not well controlled -I recommend the patient to increase the frequency of opioid pain medication as needed for moderate to severe pain -If she requires taking short-acting pain medication several times a day, then we can add on long-acting pain medication -In addition, as her pain seems to be primarily localized to the left hip, I have referred her to radiation oncology for consideration of palliative radiation -Continue monthly Xgeva -On Ca-Vit D supplement daily  Acute LLL PTE and bilateral DVT -Currently on Eliquis; patient denies any abnormal bleeding or bruising -Goal of anticoagulation is lifelong -Continue Xarelto 20mg d60m   Normocytic anemia -Most likely multifactorial, including anemia of chronic disease, CKD and treatment-related anemia  -She had briefly required Aranesp, but it has been discontinued with improvement in hemoglobin -Hgb 11.6 today, stable; patient denies any symptoms of bleeding -Continue treatment as outlined above  Stage III CKD -Cr fluctuating between 1.2 and 1.4 since 2019  -Cr 1.25 today, stable; electrolytes normal -Patient was counseled to avoid NSAIDs  -We will monitor it closely   Orders Placed This Encounter  Procedures  . Ambulatory referral to Radiation Oncology    Referral Priority:   Urgent    Referral Type:   Consultation    Referral Reason:   Specialty Services Required    Requested Specialty:   Radiation Oncology    Number of Visits Requested:   1   All questions were answered. The patient knows to  call the clinic with any problems, questions or concerns. No barriers to learning was detected.  Return in 6 weeks for labs and clinic follow-up.  [name], MD 3/23/202111:25 AM  CHIEF COMPLAINT: "My left hip is bothering me"  INTERVAL  HISTORY: Ms. Principato returns clinic for follow-up of metastatic squamous cell carcinoma of the right lung on Tagrisso.  Patient reports that over the past several weeks, she has had persistent left hip pain, for which she takes Norco approximately once a day with Tylenol several times a day as well.  Her pain keeps her up at night sometimes.  She also reports some intermittent left shoulder pain.  She denies any abnormal bleeding or bruising on Xarelto.  She denies any other complaint today.  REVIEW OF SYSTEMS:   Constitutional: ( - ) fevers, ( - )  chills , ( - ) night sweats Eyes: ( - ) blurriness of vision, ( - ) double vision, ( - ) watery eyes Ears, nose, mouth, throat, and face: ( - ) mucositis, ( - ) sore throat Respiratory: ( - ) cough, ( - ) dyspnea, ( - ) wheezes Cardiovascular: ( - ) palpitation, ( - ) chest discomfort, ( - ) lower extremity swelling Gastrointestinal:  ( - ) nausea, ( - ) heartburn, ( - ) change in bowel habits Skin: ( - ) abnormal skin rashes Lymphatics: ( - ) new lymphadenopathy, ( - ) easy bruising Neurological: ( - ) numbness, ( - ) tingling, ( - ) new weaknesses Behavioral/Psych: ( - ) mood change, ( - ) new changes  All other systems were reviewed with the patient and are negative.  SUMMARY OF ONCOLOGIC HISTORY: Oncology History  Squamous cell carcinoma of lung, right ([place])  08/31/2019 Initial Diagnosis   Squamous cell carcinoma of lung, right ([place])   08/31/2019 Imaging   CT chest w/ contrast: IMPRESSION: 1. Large poorly defined partially cavitary mass in the right upper lobe measuring approximately 4.1 x 6.4 x 3.8 cm, contiguous with ill-defined mass and/or adenopathy within the mediastinum. 1.9 cm pedunculated filling defect within the right aspect of the left atrium, appears contiguous with mediastinal soft tissue density and is concerning for left atrial invasion. There is narrowing and partial encasement of right upper lobe pulmonary arteries.  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 2. Small nonocclusive thrombus within subsegmental left lower lobe pulmonary artery. 3. 12 mm irregular right upper lobe pulmonary nodule which is either infectious, inflammatory, or neoplastic. Multifocal ill-defined consolidations in the right upper and middle lobes with diffuse ground-glass density in the right lower lobe, findings could be secondary to pneumonia, with consideration given to associated pulmonary infarcts given occluded appearance of right middle and lower lobe pulmonary arteries. 4. Numerous hypodense liver lesions, some of which are consistent with cysts. Given findings in the chest, recommend correlation with nonemergent MRI. 5. Incompletely visualized age indeterminate fracture inferior endplate T12 6. Small pericardial effusion   09/01/2019 Imaging   Doppler: Summary: Right: Findings consistent with acute deep vein thrombosis involving the right posterior tibial veins. No cystic structure found in the popliteal fossa. Left: Findings consistent with acute deep vein thrombosis involving the left popliteal vein. No cystic structure found in the popliteal fossa.   09/02/2019 Imaging   MRI  brain w/ contrast:IMPRESSION: Negative for metastatic disease to the brain.  No acute abnormality.   09/02/2019 Pathology Results   FINAL MICROSCOPIC DIAGNOSIS:   A. LUNG, RIGHT UPPER LOBE, BIOPSY:  - Benign bronchial epithelium.    09/02/2019 Pathology Results   Clinical History: Transbronchial brushings, RUL  Specimen Submitted:  A. LUNG, RIGHT UPPER LOBE, BRUSHINGS:    FINAL MICROSCOPIC DIAGNOSIS:  - Malignant cells consistent with squamous cell carcinoma     09/02/2019 Imaging   Echocardiogram: IMPRESSIONS      1. Left ventricular ejection fraction, by visual estimation, is 60 to 65%. The left ventricle has normal function. There is mildly increased left ventricular hypertrophy.  2. Left ventricular diastolic Doppler parameters are consistent with impaired relaxation pattern of LV diastolic filling.  3. Global right ventricle has mildly reduced systolic function.The right ventricular size is normal. No increase in right ventricular wall thickness.  4. Left atrial size was normal.  5. Right atrial size was normal.  6. The mitral valve is grossly normal. Trace mitral valve regurgitation.  7. The tricuspid valve is grossly normal. Tricuspid valve regurgitation was not visualized by color flow Doppler.  8. The aortic valve is tricuspid Aortic valve regurgitation was not visualized by color flow Doppler.  9. The pulmonic valve was grossly normal. Pulmonic valve regurgitation is not visualized by color flow Doppler. 10. The inferior vena cava is normal in size with <50% respiratory variability, suggesting right atrial pressure of 8 mmHg. 11. Small pericardial effusion. 12. The pericardial effusion is RV - inferior wall.   09/02/2019 Imaging   MRI brain: IMPRESSION: Negative for metastatic disease to the brain.  No acute abnormality.    09/03/2019 Imaging   MRI abdomen: IMPRESSION: 1. Severely motion degraded MRI, substantially limiting evaluation. 2. Enhancing L3 vertebral and medial right iliac T2 hypointense bone lesions, concerning for bone metastases. PET-CT strongly recommended on a short term outpatient basis for further characterization. 3. Numerous T2 hyperintense lesions scattered throughout the liver, without definite enhancement on the severely motion degraded postcontrast sequences, presumably benign liver cysts. The liver can be better staged on PET-CT.   09/21/2019 Imaging   PET: IMPRESSION: 5.4 cm medial right upper lobe  mass, corresponding to the patient's known primary bronchogenic neoplasm. Associated direct extension to the right perihilar region and left atrium.   Associated right perihilar and low right paratracheal nodal metastases.   Multifocal right lung opacities, likely reflecting a combination of tumor and postobstructive opacity/infection. Trace right pleural effusion.   Osseous metastases involving the L3 vertebral body, right iliac bone, and left femoral head/neck.   Otherwise, no evidence of metastatic disease in the abdomen/pelvis.     I have reviewed the past medical history, past surgical history, social history and family history with the patient and they are unchanged from previous note.  ALLERGIES:  has No Known Allergies.  MEDICATIONS:  Current Outpatient Medications  Medication Sig Dispense Refill  . albuterol (VENTOLIN HFA) 108 (90 Base) MCG/ACT inhaler INHALE 2 PUFFS BY MOUTH EVERY 6 HOURS AS NEEDED FOR WHEEZING AND SHORTNESS OF BREATH 18 g 2  . amLODipine (NORVASC) 10 MG tablet Take 10 mg by mouth every morning.    . baclofen (LIORESAL) 10 MG tablet TAKE 1 TABLET BY MOUTH THREE TIMES A DAY AS NEEDED FOR MUSCLE SPASMS (Patient taking differently: Take 10 mg by mouth 3 (three) times daily. ) 270 tablet 1  . benzonatate (TESSALON) 100 MG capsule Take 1 capsule (100 mg total) by mouth 3 (  three) times daily as needed for cough. 60 capsule 2  . buPROPion (WELLBUTRIN XL) 150 MG 24 hr tablet TAKE 2 TABLETS (300 MG TOTAL) BY MOUTH DAILY. 180 tablet 2  . cefpodoxime (VANTIN) 100 MG tablet Take 100 mg by mouth every morning.    . chlorhexidine (PERIDEX) 0.12 % solution Rinse with 15 mls twice daily for 30 seconds. Use after breakfast and at bedtime. Spit out excess. Do not swallow. 480 mL prn  . citalopram (CELEXA) 40 MG tablet Take 1 tablet (40 mg total) by mouth daily. 90 tablet 1  . gabapentin (NEURONTIN) 300 MG capsule Take 2 capsules (600 mg total) by mouth 3 (three) times daily.  180 capsule 1  . guaiFENesin-dextromethorphan (ROBITUSSIN DM) 100-10 MG/5ML syrup Take 5 mLs by mouth every 4 (four) hours as needed for cough. 120 mL 1  . HYDROcodone-acetaminophen (NORCO/VICODIN) 5-325 MG tablet Take 1 tablet by mouth every 8 (eight) hours as needed for moderate pain. 60 tablet 0  . lisinopril (ZESTRIL) 40 MG tablet Take 40 mg by mouth every morning.    . metoprolol tartrate (LOPRESSOR) 50 MG tablet Take 1 tablet (50 mg total) by mouth 2 (two) times daily. 180 tablet 1  . nicotine (NICODERM CQ - DOSED IN MG/24 HOURS) 14 mg/24hr patch Place 1 patch (14 mg total) onto the skin daily. 28 patch 1  . ondansetron (ZOFRAN) 8 MG tablet Take 1 tablet (8 mg total) by mouth every 8 (eight) hours as needed for nausea or vomiting. 40 tablet 2  . osimertinib mesylate (TAGRISSO) 80 MG tablet Take 80 mg by mouth every morning.    . prochlorperazine (COMPAZINE) 10 MG tablet Take 1 tablet (10 mg total) by mouth every 8 (eight) hours as needed for nausea or vomiting. 40 tablet 2  . Vitamin D, Ergocalciferol, (DRISDOL) 1.25 MG (50000 UT) CAPS capsule TAKE 1 CAPSULE (50,000 UNITS TOTAL) BY MOUTH EVERY 7 (SEVEN) DAYS. 12 capsule 0  . XARELTO 20 MG TABS tablet TAKE 1 TABLET BY MOUTH DAILY WITH SUPPER START AFTER STARTER PACK 90 tablet 1   No current facility-administered medications for this visit.    PHYSICAL EXAMINATION: ECOG PERFORMANCE STATUS: 2 - Symptomatic, <50% confined to bed  Today's Vitals   02/01/20 1102  BP: (!) 143/73  Pulse: 99  Resp: 18  Temp: (!) 97.4 F (36.3 C)  TempSrc: Temporal  SpO2: (!) 10%  Weight: 162 lb 1.9 oz (73.5 kg)  Height: 5' 5"  (1.651 m)  PainSc: 5    Body mass index is 26.98 kg/m.  Filed Weights   02/01/20 1102  Weight: 162 lb 1.9 oz (73.5 kg)    GENERAL: alert, no distress and comfortable sitting in a wheelchair  SKIN: skin color, texture, turgor are normal, no rashes or significant lesions EYES: conjunctiva are pink and non-injected, sclera  clear OROPHARYNX: no exudate, no erythema; lips, buccal mucosa, and tongue normal  NECK: supple, non-tender LUNGS: clear to auscultation with normal breathing effort HEART: regular rate & rhythm and no murmurs and no lower extremity edema ABDOMEN: soft, non-tender, non-distended, normal bowel sounds Musculoskeletal: no cyanosis of digits and no clubbing  PSYCH: alert & oriented x 3, fluent speech  LABORATORY DATA:  I have reviewed the data as listed    Component Value Date/Time   NA 140 02/01/2020 1040   K 4.4 02/01/2020 1040   CL 106 02/01/2020 1040   CO2 26 02/01/2020 1040   GLUCOSE 97 02/01/2020 1040   BUN 16 02/01/2020 1040  CREATININE 1.25 (H) 02/01/2020 1040   CALCIUM 9.3 02/01/2020 1040   PROT 7.7 02/01/2020 1040   ALBUMIN 4.1 02/01/2020 1040   AST 16 02/01/2020 1040   ALT 22 02/01/2020 1040   ALKPHOS 65 02/01/2020 1040   BILITOT 0.3 02/01/2020 1040   GFRNONAA 45 (L) 02/01/2020 1040   GFRAA 53 (L) 02/01/2020 1040    No results found for: SPEP, UPEP  Lab Results  Component Value Date   WBC 7.2 02/01/2020   NEUTROABS 4.6 02/01/2020   HGB 11.6 (L) 02/01/2020   HCT 37.0 02/01/2020   MCV 87.3 02/01/2020   PLT 253 02/01/2020      Chemistry      Component Value Date/Time   NA 140 02/01/2020 1040   K 4.4 02/01/2020 1040   CL 106 02/01/2020 1040   CO2 26 02/01/2020 1040   BUN 16 02/01/2020 1040   CREATININE 1.25 (H) 02/01/2020 1040      Component Value Date/Time   CALCIUM 9.3 02/01/2020 1040   ALKPHOS 65 02/01/2020 1040   AST 16 02/01/2020 1040   ALT 22 02/01/2020 1040   BILITOT 0.3 02/01/2020 1040       RADIOGRAPHIC STUDIES: I have personally reviewed the radiological images as listed below and agreed with the findings in the report. CT CHEST W CONTRAST  Result Date: 01/25/2020 CLINICAL DATA:  64 year old female with history of non-small cell lung cancer (squamous cell carcinoma) with metastatic disease. Follow-up study to assess treatment response.  EXAM: CT CHEST, ABDOMEN, AND PELVIS WITH CONTRAST TECHNIQUE: Multidetector CT imaging of the chest, abdomen and pelvis was performed following the standard protocol during bolus administration of intravenous contrast. CONTRAST:  160m OMNIPAQUE IOHEXOL 300 MG/ML  SOLN COMPARISON:  CT the chest, abdomen and pelvis 12/07/2019. FINDINGS: CT CHEST FINDINGS Cardiovascular: Heart size is normal. There is no significant pericardial fluid, thickening or pericardial calcification. There is aortic atherosclerosis, as well as atherosclerosis of the great vessels of the mediastinum and the coronary arteries, including calcified atherosclerotic plaque in the left anterior descending, left circumflex and right coronary arteries. Mediastinum/Nodes: Soft tissue in the right hilar region again noted which currently measures 3.4 x 1.8 cm (axial image 30 of series 2) as compared with 4.1 x 2.5 cm on the prior study, most compatible with regressing lymphadenopathy. No other pathologically enlarged mediastinal or left hilar lymph nodes are noted. Esophagus is unremarkable in appearance. No axillary lymphadenopathy. Lungs/Pleura: Previously noted thick-walled cavitary lesion in the right upper lobe (axial image 70 of series 3) is again noted with macrolobulated and spiculated margins currently measuring 3.5 x 2.3 cm (essentially unchanged). As with the prior study there are several areas of adjacent septal thickening and nodular architectural distortion in the right upper and right middle lobes which are very similar to the prior study, which remain concerning for potential lymphangitic spread of tumor, although some evolving postradiation changes are also possible. Additional areas of nodularity are noted throughout the right lung, including a 1.2 x 0.7 cm right upper lobe ground-glass attenuation nodule (axial image 50 of series 3) as well as 1.5 x 1.0 cm and 1.7 x 0.9 cm ground-glass attenuation nodules in the right lower lobe both on  axial image 93 of series 3. Left lung is clear. No pleural effusions. Musculoskeletal: Orthopedic fixation hardware in the lower cervical and upper thoracic spine. There are no aggressive appearing lytic or blastic lesions noted in the visualized portions of the skeleton. CT ABDOMEN PELVIS FINDINGS Hepatobiliary: Multiple low-attenuation lesions  are again noted throughout the hepatic parenchyma, largest of which are compatible with simple cysts, which measure up to 2.9 x 2.2 cm in segment 4B. Smaller subcentimeter low-attenuation lesions in the liver remain too small to definitively characterize, but appear similar to prior studies, presumably small cysts and/or biliary hamartomas. Pancreas: No pancreatic mass. No pancreatic ductal dilatation. No pancreatic or peripancreatic fluid collections or inflammatory changes. Spleen: Unremarkable. Adrenals/Urinary Tract: Atrophy of the right kidney. Subcentimeter low-attenuation lesion in the lower pole of the left kidney, too small to characterize, but statistically likely to represent a tiny cyst. No hydroureteronephrosis. Urinary bladder is normal in appearance. Stomach/Bowel: Normal appearance of the stomach. No pathologic dilatation of small bowel or colon. Normal appendix. Vascular/Lymphatic: Aortic atherosclerosis, without evidence of aneurysm or dissection in the abdominal or pelvic vasculature. No lymphadenopathy noted in the abdomen or pelvis. Reproductive: Status post hysterectomy. Ovaries are not confidently identified may be surgically absent or trophic. Other: No significant volume of ascites.  No pneumoperitoneum. Musculoskeletal: Sclerotic lesions are again noted in the skeleton, most evident in the L3 vertebral body, in the right ilium immediately lateral to the sacroiliac joint, and in the left femoral head. These appear unchanged compared to the prior examination. IMPRESSION: 1. Primary right upper lobe cavitary lesion appears essentially unchanged. There  has been slight regression of right hilar lymphadenopathy. Evolving changes in the right lung with a irregular areas of septal thickening and nodular architectural distortion, which may reflect residual lymphangitic spread of tumor and/or of all vein postradiation changes, as discussed above. Skeletal metastases appear unchanged. No new metastatic disease noted elsewhere in the abdomen or pelvis. 2. Aortic atherosclerosis, in addition to three vessel coronary artery disease. Please note that although the presence of coronary artery calcium documents the presence of coronary artery disease, the severity of this disease and any potential stenosis cannot be assessed on this non-gated CT examination. Assessment for potential risk factor modification, dietary therapy or pharmacologic therapy may be warranted, if clinically indicated. 3. Additional incidental findings, as above. Electronically Signed   By: Vinnie Langton M.D.   On: 01/25/2020 12:51   CT ABDOMEN PELVIS W CONTRAST  Result Date: 01/25/2020 CLINICAL DATA:  64 year old female with history of non-small cell lung cancer (squamous cell carcinoma) with metastatic disease. Follow-up study to assess treatment response. EXAM: CT CHEST, ABDOMEN, AND PELVIS WITH CONTRAST TECHNIQUE: Multidetector CT imaging of the chest, abdomen and pelvis was performed following the standard protocol during bolus administration of intravenous contrast. CONTRAST:  157m OMNIPAQUE IOHEXOL 300 MG/ML  SOLN COMPARISON:  CT the chest, abdomen and pelvis 12/07/2019. FINDINGS: CT CHEST FINDINGS Cardiovascular: Heart size is normal. There is no significant pericardial fluid, thickening or pericardial calcification. There is aortic atherosclerosis, as well as atherosclerosis of the great vessels of the mediastinum and the coronary arteries, including calcified atherosclerotic plaque in the left anterior descending, left circumflex and right coronary arteries. Mediastinum/Nodes: Soft tissue  in the right hilar region again noted which currently measures 3.4 x 1.8 cm (axial image 30 of series 2) as compared with 4.1 x 2.5 cm on the prior study, most compatible with regressing lymphadenopathy. No other pathologically enlarged mediastinal or left hilar lymph nodes are noted. Esophagus is unremarkable in appearance. No axillary lymphadenopathy. Lungs/Pleura: Previously noted thick-walled cavitary lesion in the right upper lobe (axial image 70 of series 3) is again noted with macrolobulated and spiculated margins currently measuring 3.5 x 2.3 cm (essentially unchanged). As with the prior study there are several  areas of adjacent septal thickening and nodular architectural distortion in the right upper and right middle lobes which are very similar to the prior study, which remain concerning for potential lymphangitic spread of tumor, although some evolving postradiation changes are also possible. Additional areas of nodularity are noted throughout the right lung, including a 1.2 x 0.7 cm right upper lobe ground-glass attenuation nodule (axial image 50 of series 3) as well as 1.5 x 1.0 cm and 1.7 x 0.9 cm ground-glass attenuation nodules in the right lower lobe both on axial image 93 of series 3. Left lung is clear. No pleural effusions. Musculoskeletal: Orthopedic fixation hardware in the lower cervical and upper thoracic spine. There are no aggressive appearing lytic or blastic lesions noted in the visualized portions of the skeleton. CT ABDOMEN PELVIS FINDINGS Hepatobiliary: Multiple low-attenuation lesions are again noted throughout the hepatic parenchyma, largest of which are compatible with simple cysts, which measure up to 2.9 x 2.2 cm in segment 4B. Smaller subcentimeter low-attenuation lesions in the liver remain too small to definitively characterize, but appear similar to prior studies, presumably small cysts and/or biliary hamartomas. Pancreas: No pancreatic mass. No pancreatic ductal dilatation.  No pancreatic or peripancreatic fluid collections or inflammatory changes. Spleen: Unremarkable. Adrenals/Urinary Tract: Atrophy of the right kidney. Subcentimeter low-attenuation lesion in the lower pole of the left kidney, too small to characterize, but statistically likely to represent a tiny cyst. No hydroureteronephrosis. Urinary bladder is normal in appearance. Stomach/Bowel: Normal appearance of the stomach. No pathologic dilatation of small bowel or colon. Normal appendix. Vascular/Lymphatic: Aortic atherosclerosis, without evidence of aneurysm or dissection in the abdominal or pelvic vasculature. No lymphadenopathy noted in the abdomen or pelvis. Reproductive: Status post hysterectomy. Ovaries are not confidently identified may be surgically absent or trophic. Other: No significant volume of ascites.  No pneumoperitoneum. Musculoskeletal: Sclerotic lesions are again noted in the skeleton, most evident in the L3 vertebral body, in the right ilium immediately lateral to the sacroiliac joint, and in the left femoral head. These appear unchanged compared to the prior examination. IMPRESSION: 1. Primary right upper lobe cavitary lesion appears essentially unchanged. There has been slight regression of right hilar lymphadenopathy. Evolving changes in the right lung with a irregular areas of septal thickening and nodular architectural distortion, which may reflect residual lymphangitic spread of tumor and/or of all vein postradiation changes, as discussed above. Skeletal metastases appear unchanged. No new metastatic disease noted elsewhere in the abdomen or pelvis. 2. Aortic atherosclerosis, in addition to three vessel coronary artery disease. Please note that although the presence of coronary artery calcium documents the presence of coronary artery disease, the severity of this disease and any potential stenosis cannot be assessed on this non-gated CT examination. Assessment for potential risk factor  modification, dietary therapy or pharmacologic therapy may be warranted, if clinically indicated. 3. Additional incidental findings, as above. Electronically Signed   By: Vinnie Langton M.D.   On: 01/25/2020 12:51

## 2020-02-07 ENCOUNTER — Ambulatory Visit: Payer: Medicare Other | Admitting: Family Medicine

## 2020-02-09 ENCOUNTER — Encounter: Payer: Self-pay | Admitting: *Deleted

## 2020-02-10 ENCOUNTER — Other Ambulatory Visit: Payer: Self-pay | Admitting: Hematology

## 2020-02-10 ENCOUNTER — Encounter: Payer: Self-pay | Admitting: Family Medicine

## 2020-02-10 ENCOUNTER — Encounter: Payer: Self-pay | Admitting: *Deleted

## 2020-02-10 DIAGNOSIS — C3491 Malignant neoplasm of unspecified part of right bronchus or lung: Secondary | ICD-10-CM

## 2020-02-10 MED ORDER — HYDROCODONE-ACETAMINOPHEN 10-325 MG PO TABS: 1.0000 | ORAL_TABLET | Freq: Four times a day (QID) | ORAL | 0 refills | Status: DC | PRN

## 2020-02-14 ENCOUNTER — Other Ambulatory Visit: Payer: Self-pay

## 2020-02-14 DIAGNOSIS — F3342 Major depressive disorder, recurrent, in full remission: Secondary | ICD-10-CM

## 2020-02-15 ENCOUNTER — Ambulatory Visit: Payer: Medicare Other

## 2020-02-15 ENCOUNTER — Other Ambulatory Visit: Payer: Self-pay

## 2020-02-15 DIAGNOSIS — G4733 Obstructive sleep apnea (adult) (pediatric): Secondary | ICD-10-CM

## 2020-02-15 DIAGNOSIS — G4710 Hypersomnia, unspecified: Secondary | ICD-10-CM

## 2020-02-17 ENCOUNTER — Ambulatory Visit: Payer: Medicare Other

## 2020-02-17 ENCOUNTER — Ambulatory Visit: Payer: Medicare Other | Admitting: Radiation Oncology

## 2020-02-18 NOTE — Progress Notes (Signed)
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 LUNG, RIGHT UPPER LOBE, BRUSHINGS:    FINAL MICROSCOPIC DIAGNOSIS:  - Malignant cells consistent with squamous cell carcinoma   Tobacco/Marijuana/Snuff/ETOH use:  Tobacco Use  Smoking Status Former Smoker  . Packs/day: 0.50  . Years: 45.00  . Pack years: 22.50  . Types: Cigarettes  . Start date: 11/11/1969  . Quit date: 09/01/2019  . Years since quitting: 0.3   Smokeless Tobacco Never Used  Tobacco Comment   2 cigarettes a day   Counseling given: Not Answered Comment: 2 cigarettes a day   Past/Anticipated interventions by cardiothoracic surgery, if any: None at this time.  Past/Anticipated interventions by medical oncology, if any: Per Dr. 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  In summary, CT showed improving disease, including thoracic lymphadenopathy.  There was no evidence of progressive or new metastatic disease. 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  -Review of the scans showed improving bone metastases, as manifested by sclerosis  -Currently taking Norco ~1x/day, pain not well controlled -I recommend the patient to increase the frequency of opioid pain medication as needed for moderate to severe pain -If she requires taking short-acting pain medication several times a day, then we can add on long-acting pain medication -In addition, as her pain seems to be primarily localized to the left hip, I have referred her to radiation oncology for consideration of palliative radiation -Continue monthly Xgeva -On Ca-Vit D supplement daily  Acute LLL PTE andbilateral DVT -Currently on Eliquis; patient denies any abnormal bleeding or bruising -Goal of anticoagulation is lifelong -Continue Xarelto '20mg'$  daily   Normocytic anemia -Most likely multifactorial, including anemia of chronic disease, CKD and treatment-related anemia  -She had briefly required Aranesp, but it has been discontinued with improvement in hemoglobin -Hgb 11.6 today, stable; patient denies any symptoms of bleeding -Continue treatment as outlined above  Stage III CKD -Cr fluctuating between 1.2 and 1.4 since 2019  -Cr 1.25 today, stable; electrolytes normal -Patient was counseled to avoid NSAIDs  -We will monitor it closely   -In addition, as her pain seems to be primarily localized to the left hip, I have referred her to radiation oncology for consideration of palliative radiation  SAFETY ISSUES:  Prior radiation? no  Pacemaker/ICD? no   Possible current pregnancy?no  Is the patient on methotrexate? no  Current Complaints / other details:  Pt presents today for initial consult with Dr. Sondra Come for Radiation Oncology. Pt is accompanied daughter who is pt's patient advocate.   BP (!) 164/82 (BP  Location: Left Arm)   Pulse (!) 102   Temp 98.2 F (36.8 C) (Temporal)   Resp 20   Ht _0  (1.651 m)   Wt 150  lb 6.4 oz (68.2 kg)   BMI 25.03 kg/m   Wt Readings from Last 3 Encounters:  02/21/20 150 lb 6.4 oz (68.2 kg)  02/01/20 162 lb 1.9 oz (73.5 kg)  01/13/20 159 lb (72.1 kg)   Loma Sousa, RN BSN

## 2020-02-20 NOTE — Progress Notes (Signed)
Radiation Oncology         [phone] ________________________________  Initial Outpatient Consultation  Name: Brianna Saunders MRN: [id number]  Date: 02/21/2020  DOB: 22-Sep-1956  RV:[id number], Brayton Mars, MD  [name], MD   REFERRING PHYSICIAN: [name], MD  DIAGNOSIS: The encounter diagnosis was Bone metastases [facility]).  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 female who is accompanied by her daughter. The patient presented to the ED on 08/31/2019 with chief complaint of shortness of breath. Chest x-ray at that time showed a right perihilar rounded opacity that was concerning for possible neoplasm or malignancy. CT scan of chest showed a large, poorly differentiated, partially cavitary mass in the right upper lobe measuring approximately 4.1 x 6.4 x 3.8 cm, contiguous with ill-defined mass and/or adenopathy within the mediastinum. There was also a 1.9 cm pedunculated filling defect within the right aspect of the left atrium that appeared contiguous with mediastinal soft tissue density and was concerning for left atrial invasion in addition to narrowing and partial encasement of the right upper lobe pulmonary arteries and truncation of the right pulmonary artery with no significant enhancement of the right descending pulmonary artery, right middle or lower lobe pulmonary arteries (presumed secondary to tumor occlusion with probable thrombi within the right middle and lower lobe pulmonary vessels). Finally, it showed a small non-occlusive thrombus within the subsegmental left lower lobe pulmonary artery, a 12 mm irregular right upper lobe pulmonary nodule that was considered to be either infectious, inflammatory, or neoplastic, multifocal ill-defined consolidations in the right upper and middle lobes with diffuse ground-glass densities in the right lower lobe (pneumonia vs  associated pulmonary infarcts), numerous hypodense liver lesions (some consistent with cysts), a small pericardial effusion, and an incompletely visualized age indeterminate fracture of the inferior endplate of D56.  The patient underwent a bronchoscopy on 09/02/2019 under Dr. Lamonte Sakai. Pathology from the procedure revealed malignant cells consistent with squamous cell carcinoma in the brushings. Benign bronchial epithelium was found in the right upper lobe lung biopsy. Due to insufficient tissue from the lung biopsy for molecular studies, peripheral blood NGS was obtained, which showed an exon 19 deletion.  MRI of brain on 09/02/2019 was negative for metastatic disease. MRI of abdomen on 09/03/2019 showed enhancing L3 vertebral and medial right iliac T2 hypointense bone lesions, concerning for bone metastases. There were also numerous T2 hyperintense lesions scattered throughout the liver, presumably benign liver cysts.  The patient was seen by Dr. Maylon Peppers as an inpatient consult on 09/02/2019. During that time, it was recommended that the patient undergo an MRI of the brain and abdomen.  PET scan on 09/21/2019 showed a 5.4 cm medial right upper lobe mass, corresponding to the patient's known primary bronchogenic neoplasm. There was associated direct extension to the right perihilar region and left atrium. There were also associated right perihilar and low right paratracheal nodal metastases, multifocal right lung opacities (likely reflecting a combination of tumor and postobstructive opacity/infection, trace right pleural effusion, and osseous metastases involving the L3 vertebral bone, right iliac bone, and left femoral head/neck.  In light of the presence of EGFR exon deletion, the patient began Osimertinib 80 mg daily under the care of Dr. Maylon Peppers on 09/24/2019. The patient did experience some normocytic anemia (most likely multifactorial including anemia of chronic disease, CKD, and treatment-related anemia)  for which she was receiving IV iron and  Hemoglobin levels were being monitored. The patient was cleared by dentistry and was started on Xgeva with concurrent Aranesp on 11/09/2019.  CT of chest/abdomen/pelvis on 12/07/2019 showed a positive response to therapy with partial regression of the right upper lobe mass, which was then a thick-walled cavitary lesion with persistent evidence of adjacent lymphangitic spread of tumor throughout the right lung as well as persistent right hilar lymphadenopathy that appeared slightly decreased in size compared to prior examination. There were also multiple sclerotic metastatic lesions in the visualized axial and appendicular skeleton. No new extra skeletal metastatic disease was noted in the abdomen or pelvis.  CT of chest/abdomen/pelvis on 01/25/2020 showed unchanged primary right upper lobe cavitary lesion with a slight regression of the right hilar lymphadenopathy. There were also evolving changes in the right lung with irregular areas of septal thickening and nodular architectural distortion, which may have reflected residual lymphangitic spread of tumor and/or of all vein postradiation changes. Skeletal metastases appeared unchanged and no new metastatic disease was noted elsewhere in the abdomen or pelvis.  The patient was last seen by Dr. Maylon Peppers on 02/01/2020, during which time she was having continued improvement and was to continue Tagrisso 80 mg daily.  However over the past several weeks the patient has had increasing pain in the left hip area.  She was ambulating with the assistance of a walker at home but now is unable to do this in light of her pain.  She requires a wheelchair for transportation.  Radiation oncology has been consulted for consideration of palliative treatment.  PREVIOUS RADIATION THERAPY: No  PAST MEDICAL HISTORY:  Past Medical History:  Diagnosis Date  . Cancer ([place])   . Chicken pox   . Hypertension     PAST SURGICAL HISTORY: Past  Surgical History:  Procedure Laterality Date  . FRACTURE SURGERY    . PARTIAL HYSTERECTOMY    . SPINE SURGERY     lumbar spine- around 2014  . upper back surgery     states plate in neck and into right shoulder blade reportedly- around 2014  . VIDEO BRONCHOSCOPY Bilateral 09/02/2019   Procedure: VIDEO BRONCHOSCOPY WITH FLUORO;  Surgeon: Collene Gobble, MD;  Location: [facility];  Service: Cardiopulmonary;  Laterality: Bilateral;    FAMILY HISTORY:  Family History  Problem Relation Age of Onset  . Hypertension Mother   . Heart attack Mother        79  . Hypertension Father   . Kidney disease Father        dialysis  . Kidney disease Brother        dialysis  . Heart attack Maternal Grandmother   . Early death Sister        died at 26 months    SOCIAL HISTORY:  Social History   Tobacco Use  . Smoking status: Former Smoker    Packs/day: 0.50    Years: 45.00    Pack years: 22.50    Types: Cigarettes    Start date: 11/11/1969    Quit date: 09/01/2019    Years since quitting: 0.4  . Smokeless tobacco: Never Used  . Tobacco comment: 2 cigarettes a day  Substance Use Topics  . Alcohol use: No  . Drug use: No    ALLERGIES: No Known Allergies  MEDICATIONS:  Current Outpatient Medications  Medication Sig Dispense Refill  . albuterol (VENTOLIN HFA) 108 (90 Base) MCG/ACT inhaler INHALE 2 PUFFS BY MOUTH EVERY 6 HOURS AS NEEDED FOR WHEEZING AND SHORTNESS OF  BREATH 18 g 2  . amLODipine (NORVASC) 10 MG tablet Take 10 mg by mouth every morning.    . baclofen (LIORESAL) 10 MG tablet TAKE 1 TABLET BY MOUTH THREE TIMES A DAY AS NEEDED FOR MUSCLE SPASMS (Patient taking differently: Take 10 mg by mouth 3 (three) times daily. ) 270 tablet 1  . benzonatate (TESSALON) 100 MG capsule Take 1 capsule (100 mg total) by mouth 3 (three) times daily as needed for cough. 60 capsule 2  . buPROPion (WELLBUTRIN XL) 150 MG 24 hr tablet TAKE 2 TABLETS (300 MG TOTAL) BY MOUTH DAILY. 180 tablet 2  .  cefpodoxime (VANTIN) 100 MG tablet Take 100 mg by mouth every morning.    . chlorhexidine (PERIDEX) 0.12 % solution Rinse with 15 mls twice daily for 30 seconds. Use after breakfast and at bedtime. Spit out excess. Do not swallow. 480 mL prn  . citalopram (CELEXA) 40 MG tablet Take 1 tablet (40 mg total) by mouth daily. 90 tablet 1  . gabapentin (NEURONTIN) 300 MG capsule Take 2 capsules (600 mg total) by mouth 3 (three) times daily. 180 capsule 1  . guaiFENesin-dextromethorphan (ROBITUSSIN DM) 100-10 MG/5ML syrup Take 5 mLs by mouth every 4 (four) hours as needed for cough. 120 mL 1  . HYDROcodone-acetaminophen (NORCO) 10-325 MG tablet Take 1 tablet by mouth every 6 (six) hours as needed. 60 tablet 0  . lisinopril (ZESTRIL) 40 MG tablet Take 40 mg by mouth every morning.    . metoprolol tartrate (LOPRESSOR) 50 MG tablet Take 1 tablet (50 mg total) by mouth 2 (two) times daily. 180 tablet 1  . nicotine (NICODERM CQ - DOSED IN MG/24 HOURS) 14 mg/24hr patch Place 1 patch (14 mg total) onto the skin daily. 28 patch 1  . ondansetron (ZOFRAN) 8 MG tablet Take 1 tablet (8 mg total) by mouth every 8 (eight) hours as needed for nausea or vomiting. 40 tablet 2  . osimertinib mesylate (TAGRISSO) 80 MG tablet Take 80 mg by mouth every morning.    . prochlorperazine (COMPAZINE) 10 MG tablet Take 1 tablet (10 mg total) by mouth every 8 (eight) hours as needed for nausea or vomiting. 40 tablet 2  . Vitamin D, Ergocalciferol, (DRISDOL) 1.25 MG (50000 UT) CAPS capsule TAKE 1 CAPSULE (50,000 UNITS TOTAL) BY MOUTH EVERY 7 (SEVEN) DAYS. 12 capsule 0  . XARELTO 20 MG TABS tablet TAKE 1 TABLET BY MOUTH DAILY WITH SUPPER START AFTER STARTER PACK 90 tablet 1   No current facility-administered medications for this encounter.    REVIEW OF SYSTEMS:  A 10+ POINT REVIEW OF SYSTEMS WAS OBTAINED including neurology, dermatology, psychiatry, cardiac, respiratory, lymph, extremities, GI, GU, musculoskeletal, constitutional,  reproductive, HEENT.  Patient's only pain area is in the left pelvis region.  She is unable to stand with her walker in light of this pain intensity.   PHYSICAL EXAM:  height is 5' 5"  (1.651 m) and weight is 150 lb 6.4 oz (68.2 kg). Her temporal temperature is 98.2 F (36.8 C). Her blood pressure is 164/82 (abnormal) and her pulse is 102 (abnormal). Her respiration is 20.   General: Alert and oriented, in no acute distress, remains in wheelchair for evaluation HEENT: Head is normocephalic. Extraocular movements are intact.  Neck: Neck is supple, no palpable cervical or supraclavicular lymphadenopathy. Heart: Regular in rate and rhythm with no murmurs, rubs, or gallops. Chest: Clear to auscultation bilaterally, with no rhonchi, wheezes, or rales. Abdomen: Soft, nontender, nondistended, with no rigidity or  guarding. Extremities: No cyanosis or edema. Lymphatics: see Neck Exam Skin: No concerning lesions. Musculoskeletal: symmetric strength and muscle tone throughout. Neurologic: Cranial nerves II through XII are grossly intact. No obvious focalities. Speech is fluent. Coordination is intact. Psychiatric: Judgment and insight are intact. Affect is appropriate. Unable to raise her left leg off the wheelchair in light of her pain intensity.  Right leg strength and mobility is normal.  Patient points to origin of pain is in the left groin region.  ECOG = 3  0 - Asymptomatic (Fully active, able to carry on all predisease activities without restriction)  1 - Symptomatic but completely ambulatory (Restricted in physically strenuous activity but ambulatory and able to carry out work of a light or sedentary nature. For example, light housework, office work)  2 - Symptomatic, <50% in bed during the day (Ambulatory and capable of all self care but unable to carry out any work activities. Up and about more than 50% of waking hours)  3 - Symptomatic, >50% in bed, but not bedbound (Capable of only limited  self-care, confined to bed or chair 50% or more of waking hours)  4 - Bedbound (Completely disabled. Cannot carry on any self-care. Totally confined to bed or chair)  5 - Death   Eustace Pen MM, Creech RH, Tormey DC, et al. [phone]). "Toxicity and response criteria of the [facility]". [place] Oncol. 5 (6): 649-55  LABORATORY DATA:  Lab Results  Component Value Date   WBC 7.2 02/01/2020   HGB 11.6 (L) 02/01/2020   HCT 37.0 02/01/2020   MCV 87.3 02/01/2020   PLT 253 02/01/2020   NEUTROABS 4.6 02/01/2020   Lab Results  Component Value Date   NA 140 02/01/2020   K 4.4 02/01/2020   CL 106 02/01/2020   CO2 26 02/01/2020   GLUCOSE 97 02/01/2020   CREATININE 1.25 (H) 02/01/2020   CALCIUM 9.3 02/01/2020      RADIOGRAPHY: CT CHEST W CONTRAST  Result Date: 01/25/2020 CLINICAL DATA:  64 year old female with history of non-small cell lung cancer (squamous cell carcinoma) with metastatic disease. Follow-up study to assess treatment response. EXAM: CT CHEST, ABDOMEN, AND PELVIS WITH CONTRAST TECHNIQUE: Multidetector CT imaging of the chest, abdomen and pelvis was performed following the standard protocol during bolus administration of intravenous contrast. CONTRAST:  159m OMNIPAQUE IOHEXOL 300 MG/ML  SOLN COMPARISON:  CT the chest, abdomen and pelvis 12/07/2019. FINDINGS: CT CHEST FINDINGS Cardiovascular: Heart size is normal. There is no significant pericardial fluid, thickening or pericardial calcification. There is aortic atherosclerosis, as well as atherosclerosis of the great vessels of the mediastinum and the coronary arteries, including calcified atherosclerotic plaque in the left anterior descending, left circumflex and right coronary arteries. Mediastinum/Nodes: Soft tissue in the right hilar region again noted which currently measures 3.4 x 1.8 cm (axial image 30 of series 2) as compared with 4.1 x 2.5 cm on the prior study, most compatible with regressing  lymphadenopathy. No other pathologically enlarged mediastinal or left hilar lymph nodes are noted. Esophagus is unremarkable in appearance. No axillary lymphadenopathy. Lungs/Pleura: Previously noted thick-walled cavitary lesion in the right upper lobe (axial image 70 of series 3) is again noted with macrolobulated and spiculated margins currently measuring 3.5 x 2.3 cm (essentially unchanged). As with the prior study there are several areas of adjacent septal thickening and nodular architectural distortion in the right upper and right middle lobes which are very similar to the prior study, which remain concerning for potential  lymphangitic spread of tumor, although some evolving postradiation changes are also possible. Additional areas of nodularity are noted throughout the right lung, including a 1.2 x 0.7 cm right upper lobe ground-glass attenuation nodule (axial image 50 of series 3) as well as 1.5 x 1.0 cm and 1.7 x 0.9 cm ground-glass attenuation nodules in the right lower lobe both on axial image 93 of series 3. Left lung is clear. No pleural effusions. Musculoskeletal: Orthopedic fixation hardware in the lower cervical and upper thoracic spine. There are no aggressive appearing lytic or blastic lesions noted in the visualized portions of the skeleton. CT ABDOMEN PELVIS FINDINGS Hepatobiliary: Multiple low-attenuation lesions are again noted throughout the hepatic parenchyma, largest of which are compatible with simple cysts, which measure up to 2.9 x 2.2 cm in segment 4B. Smaller subcentimeter low-attenuation lesions in the liver remain too small to definitively characterize, but appear similar to prior studies, presumably small cysts and/or biliary hamartomas. Pancreas: No pancreatic mass. No pancreatic ductal dilatation. No pancreatic or peripancreatic fluid collections or inflammatory changes. Spleen: Unremarkable. Adrenals/Urinary Tract: Atrophy of the right kidney. Subcentimeter low-attenuation lesion  in the lower pole of the left kidney, too small to characterize, but statistically likely to represent a tiny cyst. No hydroureteronephrosis. Urinary bladder is normal in appearance. Stomach/Bowel: Normal appearance of the stomach. No pathologic dilatation of small bowel or colon. Normal appendix. Vascular/Lymphatic: Aortic atherosclerosis, without evidence of aneurysm or dissection in the abdominal or pelvic vasculature. No lymphadenopathy noted in the abdomen or pelvis. Reproductive: Status post hysterectomy. Ovaries are not confidently identified may be surgically absent or trophic. Other: No significant volume of ascites.  No pneumoperitoneum. Musculoskeletal: Sclerotic lesions are again noted in the skeleton, most evident in the L3 vertebral body, in the right ilium immediately lateral to the sacroiliac joint, and in the left femoral head. These appear unchanged compared to the prior examination. IMPRESSION: 1. Primary right upper lobe cavitary lesion appears essentially unchanged. There has been slight regression of right hilar lymphadenopathy. Evolving changes in the right lung with a irregular areas of septal thickening and nodular architectural distortion, which may reflect residual lymphangitic spread of tumor and/or of all vein postradiation changes, as discussed above. Skeletal metastases appear unchanged. No new metastatic disease noted elsewhere in the abdomen or pelvis. 2. Aortic atherosclerosis, in addition to three vessel coronary artery disease. Please note that although the presence of coronary artery calcium documents the presence of coronary artery disease, the severity of this disease and any potential stenosis cannot be assessed on this non-gated CT examination. Assessment for potential risk factor modification, dietary therapy or pharmacologic therapy may be warranted, if clinically indicated. 3. Additional incidental findings, as above. Electronically Signed   By: Vinnie Langton M.D.   On:  01/25/2020 12:51   CT ABDOMEN PELVIS W CONTRAST  Result Date: 01/25/2020 CLINICAL DATA:  64 year old female with history of non-small cell lung cancer (squamous cell carcinoma) with metastatic disease. Follow-up study to assess treatment response. EXAM: CT CHEST, ABDOMEN, AND PELVIS WITH CONTRAST TECHNIQUE: Multidetector CT imaging of the chest, abdomen and pelvis was performed following the standard protocol during bolus administration of intravenous contrast. CONTRAST:  197m OMNIPAQUE IOHEXOL 300 MG/ML  SOLN COMPARISON:  CT the chest, abdomen and pelvis 12/07/2019. FINDINGS: CT CHEST FINDINGS Cardiovascular: Heart size is normal. There is no significant pericardial fluid, thickening or pericardial calcification. There is aortic atherosclerosis, as well as atherosclerosis of the great vessels of the mediastinum and the coronary arteries, including calcified atherosclerotic  plaque in the left anterior descending, left circumflex and right coronary arteries. Mediastinum/Nodes: Soft tissue in the right hilar region again noted which currently measures 3.4 x 1.8 cm (axial image 30 of series 2) as compared with 4.1 x 2.5 cm on the prior study, most compatible with regressing lymphadenopathy. No other pathologically enlarged mediastinal or left hilar lymph nodes are noted. Esophagus is unremarkable in appearance. No axillary lymphadenopathy. Lungs/Pleura: Previously noted thick-walled cavitary lesion in the right upper lobe (axial image 70 of series 3) is again noted with macrolobulated and spiculated margins currently measuring 3.5 x 2.3 cm (essentially unchanged). As with the prior study there are several areas of adjacent septal thickening and nodular architectural distortion in the right upper and right middle lobes which are very similar to the prior study, which remain concerning for potential lymphangitic spread of tumor, although some evolving postradiation changes are also possible. Additional areas of  nodularity are noted throughout the right lung, including a 1.2 x 0.7 cm right upper lobe ground-glass attenuation nodule (axial image 50 of series 3) as well as 1.5 x 1.0 cm and 1.7 x 0.9 cm ground-glass attenuation nodules in the right lower lobe both on axial image 93 of series 3. Left lung is clear. No pleural effusions. Musculoskeletal: Orthopedic fixation hardware in the lower cervical and upper thoracic spine. There are no aggressive appearing lytic or blastic lesions noted in the visualized portions of the skeleton. CT ABDOMEN PELVIS FINDINGS Hepatobiliary: Multiple low-attenuation lesions are again noted throughout the hepatic parenchyma, largest of which are compatible with simple cysts, which measure up to 2.9 x 2.2 cm in segment 4B. Smaller subcentimeter low-attenuation lesions in the liver remain too small to definitively characterize, but appear similar to prior studies, presumably small cysts and/or biliary hamartomas. Pancreas: No pancreatic mass. No pancreatic ductal dilatation. No pancreatic or peripancreatic fluid collections or inflammatory changes. Spleen: Unremarkable. Adrenals/Urinary Tract: Atrophy of the right kidney. Subcentimeter low-attenuation lesion in the lower pole of the left kidney, too small to characterize, but statistically likely to represent a tiny cyst. No hydroureteronephrosis. Urinary bladder is normal in appearance. Stomach/Bowel: Normal appearance of the stomach. No pathologic dilatation of small bowel or colon. Normal appendix. Vascular/Lymphatic: Aortic atherosclerosis, without evidence of aneurysm or dissection in the abdominal or pelvic vasculature. No lymphadenopathy noted in the abdomen or pelvis. Reproductive: Status post hysterectomy. Ovaries are not confidently identified may be surgically absent or trophic. Other: No significant volume of ascites.  No pneumoperitoneum. Musculoskeletal: Sclerotic lesions are again noted in the skeleton, most evident in the L3  vertebral body, in the right ilium immediately lateral to the sacroiliac joint, and in the left femoral head. These appear unchanged compared to the prior examination. IMPRESSION: 1. Primary right upper lobe cavitary lesion appears essentially unchanged. There has been slight regression of right hilar lymphadenopathy. Evolving changes in the right lung with a irregular areas of septal thickening and nodular architectural distortion, which may reflect residual lymphangitic spread of tumor and/or of all vein postradiation changes, as discussed above. Skeletal metastases appear unchanged. No new metastatic disease noted elsewhere in the abdomen or pelvis. 2. Aortic atherosclerosis, in addition to three vessel coronary artery disease. Please note that although the presence of coronary artery calcium documents the presence of coronary artery disease, the severity of this disease and any potential stenosis cannot be assessed on this non-gated CT examination. Assessment for potential risk factor modification, dietary therapy or pharmacologic therapy may be warranted, if clinically indicated. 3. Additional  incidental findings, as above. Electronically Signed   By: Vinnie Langton M.D.   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  This pain intensity has progressed over the past several weeks and the patient is essentially nonambulatory at this time.Marland Kitchen  She would be a good candidate for palliative radiation therapy directed this area.  We did discuss the consideration for orthopedic evaluation but the patient would like to proceed with radiation therapy first without this evaluation.  She does understand that her left femur could potentially fracture due to cancer in this region.   Today, I talked to the patient  and daughter about the findings and work-up thus far.  We discussed the natural history of squamous cell carcinoma of the lung and general treatment, highlighting the role of radiotherapy in the management.  We discussed the available radiation techniques, and focused on the details of logistics and delivery.  We reviewed the anticipated acute and late sequelae associated with radiation in this setting.  The patient was encouraged to ask questions that I answered to the best of my ability.  A patient consent form was discussed and signed.  We retained a copy for our records.  The patient would like to proceed with radiation and will be scheduled for CT simulation.  PLAN: Patient will proceed with CT simulation later this morning with treatments to begin April 14.  She is to receive 10 treatments to the left proximal femur region.    ------------------------------------------------  Blair Promise, PhD, MD  This document serves as a record of services personally performed by Gery Pray, MD. It was created on his behalf by Clerance Lav, a trained medical scribe. The creation of this record is based on the scribe's personal observations and the provider's statements to them. This document has been checked and approved by the attending provider.

## 2020-02-21 ENCOUNTER — Ambulatory Visit
Admission: RE | Admit: 2020-02-21 | Discharge: 2020-02-21 | Disposition: A | Discharge status: Home / Self Care | Payer: Medicare Other | Source: Ambulatory Visit | Attending: Radiation Oncology | Admitting: Radiation Oncology

## 2020-02-21 ENCOUNTER — Other Ambulatory Visit: Payer: Self-pay

## 2020-02-21 ENCOUNTER — Encounter: Payer: Self-pay | Admitting: Radiation Oncology

## 2020-02-21 DIAGNOSIS — Z7901 Long term (current) use of anticoagulants: Secondary | ICD-10-CM | POA: Insufficient documentation

## 2020-02-21 DIAGNOSIS — Z510 Encounter for antineoplastic radiation therapy: Secondary | ICD-10-CM | POA: Insufficient documentation

## 2020-02-21 DIAGNOSIS — C3411 Malignant neoplasm of upper lobe, right bronchus or lung: Secondary | ICD-10-CM | POA: Insufficient documentation

## 2020-02-21 DIAGNOSIS — G893 Neoplasm related pain (acute) (chronic): Secondary | ICD-10-CM | POA: Diagnosis not present

## 2020-02-21 DIAGNOSIS — J984 Other disorders of lung: Secondary | ICD-10-CM | POA: Insufficient documentation

## 2020-02-21 DIAGNOSIS — Z79899 Other long term (current) drug therapy: Secondary | ICD-10-CM | POA: Insufficient documentation

## 2020-02-21 DIAGNOSIS — I2510 Atherosclerotic heart disease of native coronary artery without angina pectoris: Secondary | ICD-10-CM | POA: Diagnosis not present

## 2020-02-21 DIAGNOSIS — C7951 Secondary malignant neoplasm of bone: Secondary | ICD-10-CM | POA: Insufficient documentation

## 2020-02-21 DIAGNOSIS — I10 Essential (primary) hypertension: Secondary | ICD-10-CM | POA: Diagnosis not present

## 2020-02-21 DIAGNOSIS — Z87891 Personal history of nicotine dependence: Secondary | ICD-10-CM | POA: Diagnosis not present

## 2020-02-21 DIAGNOSIS — C3491 Malignant neoplasm of unspecified part of right bronchus or lung: Secondary | ICD-10-CM

## 2020-02-21 NOTE — Progress Notes (Signed)
  Radiation Oncology         [phone] ________________________________  Name: Melda Mermelstein MRN: [id number]  Date: 02/21/2020  DOB: 18-Jul-1956  SIMULATION AND TREATMENT PLANNING NOTE    ICD-10-CM   1. Squamous cell carcinoma of lung, right (HCC)  C34.91   2. 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  Identity was confirmed.  All relevant records and images related to the planned course of therapy were reviewed.  The patient freely provided informed written consent to proceed with treatment after reviewing the details related to the planned course of therapy. The consent form was witnessed and verified by the simulation staff.  Then, the patient was set-up in a stable reproducible  supine position for radiation therapy.  CT images were obtained.  Surface markings were placed.  The CT images were loaded into the planning software.  Then the target and avoidance structures were contoured.  Treatment planning then occurred.  The radiation prescription was entered and confirmed.  Then, I designed and supervised the construction of a total of 5 medically necessary complex treatment devices.  I have requested : Isodose Plan.  I have ordered:dose calc.  PLAN:  The patient will receive 30 Gy in 10 fractions.  Treatments to start April 14.  -----------------------------------  Blair Promise, PhD, MD  This document serves as a record of services personally performed by Gery Pray, MD. It was created on his behalf by Clerance Lav, a trained medical scribe. The creation of this record is based on the scribe's personal observations and the provider's statements to them. This document has been checked and approved by the attending provider.

## 2020-02-21 NOTE — Patient Instructions (Signed)
Coronavirus (COVID-19) Are you at risk?  Are you at risk for the Coronavirus (COVID-19)?  To be considered HIGH RISK for Coronavirus (COVID-19), you have to meet the following criteria:  . Traveled to [place], [place], [place], [place] or [place]; or in the [place] to [place], [place], [place], or [place]; and have fever, cough, and shortness of breath within the last 2 weeks of travel OR . Been in close contact with a person diagnosed with COVID-19 within the last 2 weeks and have fever, cough, and shortness of breath . IF YOU DO NOT MEET THESE CRITERIA, YOU ARE CONSIDERED LOW RISK FOR COVID-19.  What to do if you are HIGH RISK for COVID-19?  . If you are having a medical emergency, call 911. . Seek medical care right away. Before you go to a doctor's office, urgent care or emergency department, call ahead and tell them about your recent travel, contact with someone diagnosed with COVID-19, and your symptoms. You should receive instructions from your physician's office regarding next steps of care.  . When you arrive at healthcare provider, tell the healthcare staff immediately you have returned from visiting [place], [place], [place], [place] or [place]; or traveled in the [place] to [place], [place], [place], or [place]; in the last two weeks or you have been in close contact with a person diagnosed with COVID-19 in the last 2 weeks.   . Tell the health care staff about your symptoms: fever, cough and shortness of breath. . After you have been seen by a medical provider, you will be either: o Tested for (COVID-19) and discharged home on quarantine except to seek medical care if symptoms worsen, and asked to  - Stay home and avoid contact with others until you get your results (4-5 days)  - Avoid travel on public transportation if possible (such as bus, train, or airplane) or o Sent to the Emergency Department by EMS for evaluation, COVID-19 testing, and possible  admission depending on your condition and test results.  What to do if you are LOW RISK for COVID-19?  Reduce your risk of any infection by using the same precautions used for avoiding the common cold or flu:  . Wash your hands often with soap and warm water for at least 20 seconds.  If soap and water are not readily available, use an alcohol-based hand sanitizer with at least 60% alcohol.  . If coughing or sneezing, cover your mouth and nose by coughing or sneezing into the elbow areas of your shirt or coat, into a tissue or into your sleeve (not your hands). . Avoid shaking hands with others and consider head nods or verbal greetings only. . Avoid touching your eyes, nose, or mouth with unwashed hands.  . Avoid close contact with people who are sick. . Avoid places or events with large numbers of people in one location, like concerts or sporting events. . Carefully consider travel plans you have or are making. . If you are planning any travel outside or inside the [place], visit the CDC's Travelers' Health webpage for the latest health notices. . If you have some symptoms but not all symptoms, continue to monitor at home and seek medical attention if your symptoms worsen. . If you are having a medical emergency, call 911.   ADDITIONAL HEALTHCARE OPTIONS FOR PATIENTS  [facility] / e-Visit: [email or web address]         [facility]: [phone]  [facility]: [phone]                   [facility]: [phone]   

## 2020-02-22 ENCOUNTER — Other Ambulatory Visit: Payer: Self-pay | Admitting: Family Medicine

## 2020-02-22 DIAGNOSIS — C7951 Secondary malignant neoplasm of bone: Secondary | ICD-10-CM | POA: Diagnosis not present

## 2020-02-22 DIAGNOSIS — C3411 Malignant neoplasm of upper lobe, right bronchus or lung: Secondary | ICD-10-CM | POA: Diagnosis not present

## 2020-02-22 DIAGNOSIS — Z510 Encounter for antineoplastic radiation therapy: Secondary | ICD-10-CM | POA: Diagnosis not present

## 2020-02-22 NOTE — Progress Notes (Signed)
  Radiation Oncology         [phone] ________________________________  Name: Brianna Saunders MRN: [id number]  Date: 02/23/2020  DOB: Jan 20, 1956  Simulation Verification Note    ICD-10-CM   1. Bone metastases ([place])  C79.51     NARRATIVE: The patient was brought to the treatment unit and placed in the planned treatment position. The clinical setup was verified. Then port films were obtained and uploaded to the radiation oncology medical record software.  The treatment beams were carefully compared against the planned radiation fields. The position location and shape of the radiation fields was reviewed. They targeted volume of tissue appears to be appropriately covered by the radiation beams. Organs at risk appear to be excluded as planned.  Based on my personal review, I approved the simulation verification. The patient's treatment will proceed as planned.  -----------------------------------  Blair Promise, PhD, MD  This document serves as a record of services personally performed by Gery Pray, MD. It was created on his behalf by Clerance Lav, a trained medical scribe. The creation of this record is based on the scribe's personal observations and the provider's statements to them. This document has been checked and approved by the attending provider.

## 2020-02-23 ENCOUNTER — Ambulatory Visit
Admission: RE | Admit: 2020-02-23 | Discharge: 2020-02-23 | Disposition: A | Discharge status: Home / Self Care | Payer: Medicare Other | Source: Ambulatory Visit | Attending: Radiation Oncology | Admitting: Radiation Oncology

## 2020-02-23 ENCOUNTER — Other Ambulatory Visit: Payer: Self-pay

## 2020-02-23 DIAGNOSIS — Z510 Encounter for antineoplastic radiation therapy: Secondary | ICD-10-CM | POA: Diagnosis not present

## 2020-02-23 DIAGNOSIS — C7951 Secondary malignant neoplasm of bone: Secondary | ICD-10-CM | POA: Diagnosis not present

## 2020-02-23 DIAGNOSIS — C3411 Malignant neoplasm of upper lobe, right bronchus or lung: Secondary | ICD-10-CM | POA: Diagnosis not present

## 2020-02-24 ENCOUNTER — Ambulatory Visit
Admission: RE | Admit: 2020-02-24 | Discharge: 2020-02-24 | Disposition: A | Discharge status: Home / Self Care | Payer: Medicare Other | Source: Ambulatory Visit | Attending: Radiation Oncology | Admitting: Radiation Oncology

## 2020-02-24 DIAGNOSIS — Z510 Encounter for antineoplastic radiation therapy: Secondary | ICD-10-CM | POA: Diagnosis not present

## 2020-02-24 DIAGNOSIS — C3411 Malignant neoplasm of upper lobe, right bronchus or lung: Secondary | ICD-10-CM | POA: Diagnosis not present

## 2020-02-24 DIAGNOSIS — G4733 Obstructive sleep apnea (adult) (pediatric): Secondary | ICD-10-CM | POA: Diagnosis not present

## 2020-02-24 DIAGNOSIS — C7951 Secondary malignant neoplasm of bone: Secondary | ICD-10-CM | POA: Diagnosis not present

## 2020-02-25 ENCOUNTER — Other Ambulatory Visit: Payer: Self-pay

## 2020-02-25 ENCOUNTER — Ambulatory Visit
Admission: RE | Admit: 2020-02-25 | Discharge: 2020-02-25 | Disposition: A | Discharge status: Home / Self Care | Payer: Medicare Other | Source: Ambulatory Visit | Attending: Radiation Oncology | Admitting: Radiation Oncology

## 2020-02-25 ENCOUNTER — Telehealth: Payer: Self-pay | Admitting: Pulmonary Disease

## 2020-02-25 DIAGNOSIS — G4733 Obstructive sleep apnea (adult) (pediatric): Secondary | ICD-10-CM

## 2020-02-25 DIAGNOSIS — C3411 Malignant neoplasm of upper lobe, right bronchus or lung: Secondary | ICD-10-CM | POA: Diagnosis not present

## 2020-02-25 DIAGNOSIS — C7951 Secondary malignant neoplasm of bone: Secondary | ICD-10-CM | POA: Diagnosis not present

## 2020-02-25 DIAGNOSIS — Z510 Encounter for antineoplastic radiation therapy: Secondary | ICD-10-CM | POA: Diagnosis not present

## 2020-02-25 NOTE — Telephone Encounter (Signed)
Advised pt's daughter of results. She understood and nothing further is needed. CPAP order placed.

## 2020-02-25 NOTE — Telephone Encounter (Signed)
Pt returning call for results [phone]

## 2020-02-25 NOTE — Telephone Encounter (Signed)
ATC Patient.  LM for Patient to call back for sleep results.   Dr. Ander Slade has reviewed the home sleep test this showed mild sleep apnea.   Recommendations   Treatment options are CPAP with the settings auto 5 to 15. Oral device may be considered as a treatment for mild osa.    Weight loss measures .   Advise against driving while sleepy & against medication with sedative side effects.    Make appointment for 3 months for compliance with download with Dr. Ander Slade or NP.

## 2020-02-28 ENCOUNTER — Ambulatory Visit
Admission: RE | Admit: 2020-02-28 | Discharge: 2020-02-28 | Disposition: A | Discharge status: Home / Self Care | Payer: Medicare Other | Source: Ambulatory Visit | Attending: Radiation Oncology | Admitting: Radiation Oncology

## 2020-02-28 ENCOUNTER — Other Ambulatory Visit: Payer: Self-pay

## 2020-02-28 DIAGNOSIS — C7951 Secondary malignant neoplasm of bone: Secondary | ICD-10-CM | POA: Diagnosis not present

## 2020-02-28 DIAGNOSIS — C3411 Malignant neoplasm of upper lobe, right bronchus or lung: Secondary | ICD-10-CM | POA: Diagnosis not present

## 2020-02-28 DIAGNOSIS — Z510 Encounter for antineoplastic radiation therapy: Secondary | ICD-10-CM | POA: Diagnosis not present

## 2020-02-29 ENCOUNTER — Ambulatory Visit: Payer: Medicare Other | Admitting: Hematology

## 2020-02-29 ENCOUNTER — Ambulatory Visit: Payer: Medicare Other

## 2020-02-29 ENCOUNTER — Other Ambulatory Visit: Payer: Medicare Other

## 2020-02-29 ENCOUNTER — Other Ambulatory Visit: Payer: Self-pay

## 2020-02-29 ENCOUNTER — Ambulatory Visit
Admission: RE | Admit: 2020-02-29 | Discharge: 2020-02-29 | Disposition: A | Discharge status: Home / Self Care | Payer: Medicare Other | Source: Ambulatory Visit | Attending: Radiation Oncology | Admitting: Radiation Oncology

## 2020-02-29 DIAGNOSIS — Z510 Encounter for antineoplastic radiation therapy: Secondary | ICD-10-CM | POA: Diagnosis not present

## 2020-02-29 DIAGNOSIS — C7951 Secondary malignant neoplasm of bone: Secondary | ICD-10-CM | POA: Diagnosis not present

## 2020-02-29 DIAGNOSIS — C3411 Malignant neoplasm of upper lobe, right bronchus or lung: Secondary | ICD-10-CM | POA: Diagnosis not present

## 2020-03-01 ENCOUNTER — Other Ambulatory Visit: Payer: Self-pay | Admitting: Hematology

## 2020-03-01 ENCOUNTER — Ambulatory Visit
Admission: RE | Admit: 2020-03-01 | Discharge: 2020-03-01 | Disposition: A | Discharge status: Home / Self Care | Payer: Medicare Other | Source: Ambulatory Visit | Attending: Radiation Oncology | Admitting: Radiation Oncology

## 2020-03-01 DIAGNOSIS — C3491 Malignant neoplasm of unspecified part of right bronchus or lung: Secondary | ICD-10-CM

## 2020-03-01 DIAGNOSIS — Z510 Encounter for antineoplastic radiation therapy: Secondary | ICD-10-CM | POA: Diagnosis not present

## 2020-03-01 DIAGNOSIS — C7951 Secondary malignant neoplasm of bone: Secondary | ICD-10-CM | POA: Diagnosis not present

## 2020-03-01 DIAGNOSIS — C3411 Malignant neoplasm of upper lobe, right bronchus or lung: Secondary | ICD-10-CM | POA: Diagnosis not present

## 2020-03-02 ENCOUNTER — Other Ambulatory Visit: Payer: Self-pay

## 2020-03-02 ENCOUNTER — Other Ambulatory Visit: Payer: Self-pay | Admitting: *Deleted

## 2020-03-02 ENCOUNTER — Ambulatory Visit
Admission: RE | Admit: 2020-03-02 | Discharge: 2020-03-02 | Disposition: A | Discharge status: Home / Self Care | Payer: Medicare Other | Source: Ambulatory Visit | Attending: Radiation Oncology | Admitting: Radiation Oncology

## 2020-03-02 DIAGNOSIS — C7951 Secondary malignant neoplasm of bone: Secondary | ICD-10-CM | POA: Diagnosis not present

## 2020-03-02 DIAGNOSIS — C3491 Malignant neoplasm of unspecified part of right bronchus or lung: Secondary | ICD-10-CM

## 2020-03-02 DIAGNOSIS — Z510 Encounter for antineoplastic radiation therapy: Secondary | ICD-10-CM | POA: Diagnosis not present

## 2020-03-02 DIAGNOSIS — C3411 Malignant neoplasm of upper lobe, right bronchus or lung: Secondary | ICD-10-CM | POA: Diagnosis not present

## 2020-03-02 MED ORDER — HYDROCODONE-ACETAMINOPHEN 10-325 MG PO TABS: 1.0000 | ORAL_TABLET | Freq: Four times a day (QID) | ORAL | 0 refills | Status: DC | PRN

## 2020-03-03 ENCOUNTER — Other Ambulatory Visit: Payer: Self-pay

## 2020-03-03 ENCOUNTER — Ambulatory Visit
Admission: RE | Admit: 2020-03-03 | Discharge: 2020-03-03 | Disposition: A | Discharge status: Home / Self Care | Payer: Medicare Other | Source: Ambulatory Visit | Attending: Radiation Oncology | Admitting: Radiation Oncology

## 2020-03-03 ENCOUNTER — Encounter: Payer: Self-pay | Admitting: Family Medicine

## 2020-03-03 DIAGNOSIS — C7951 Secondary malignant neoplasm of bone: Secondary | ICD-10-CM | POA: Diagnosis not present

## 2020-03-03 DIAGNOSIS — C3411 Malignant neoplasm of upper lobe, right bronchus or lung: Secondary | ICD-10-CM | POA: Diagnosis not present

## 2020-03-03 DIAGNOSIS — Z510 Encounter for antineoplastic radiation therapy: Secondary | ICD-10-CM | POA: Diagnosis not present

## 2020-03-04 ENCOUNTER — Telehealth: Payer: Self-pay

## 2020-03-04 ENCOUNTER — Other Ambulatory Visit: Payer: Self-pay

## 2020-03-04 MED ORDER — BUPROPION HCL ER (XL) 150 MG PO TB24: ORAL_TABLET | ORAL | 2 refills | Status: AC

## 2020-03-04 NOTE — Telephone Encounter (Signed)
[facility] [place] Case ID: [id number] Need help? Call us at [phone] Status Sent to [facility] buPROPion HCl ER (XL) 150MG  er tablets Form [employer] Electronic [place] Form [id number] NCPDP)

## 2020-03-06 ENCOUNTER — Other Ambulatory Visit: Payer: Self-pay

## 2020-03-06 ENCOUNTER — Ambulatory Visit
Admission: RE | Admit: 2020-03-06 | Discharge: 2020-03-06 | Disposition: A | Discharge status: Home / Self Care | Payer: Medicare Other | Source: Ambulatory Visit | Attending: Radiation Oncology | Admitting: Radiation Oncology

## 2020-03-06 DIAGNOSIS — C3411 Malignant neoplasm of upper lobe, right bronchus or lung: Secondary | ICD-10-CM | POA: Diagnosis not present

## 2020-03-06 DIAGNOSIS — Z510 Encounter for antineoplastic radiation therapy: Secondary | ICD-10-CM | POA: Diagnosis not present

## 2020-03-06 DIAGNOSIS — C7951 Secondary malignant neoplasm of bone: Secondary | ICD-10-CM | POA: Diagnosis not present

## 2020-03-07 ENCOUNTER — Ambulatory Visit
Admission: RE | Admit: 2020-03-07 | Discharge: 2020-03-07 | Disposition: A | Discharge status: Home / Self Care | Payer: Medicare Other | Source: Ambulatory Visit | Attending: Radiation Oncology | Admitting: Radiation Oncology

## 2020-03-07 ENCOUNTER — Other Ambulatory Visit: Payer: Self-pay

## 2020-03-07 ENCOUNTER — Encounter: Payer: Self-pay | Admitting: Radiation Oncology

## 2020-03-07 DIAGNOSIS — C3411 Malignant neoplasm of upper lobe, right bronchus or lung: Secondary | ICD-10-CM | POA: Diagnosis not present

## 2020-03-07 DIAGNOSIS — C7951 Secondary malignant neoplasm of bone: Secondary | ICD-10-CM | POA: Diagnosis not present

## 2020-03-07 DIAGNOSIS — Z510 Encounter for antineoplastic radiation therapy: Secondary | ICD-10-CM | POA: Diagnosis not present

## 2020-03-14 ENCOUNTER — Inpatient Hospital Stay: Payer: Medicare Other

## 2020-03-14 ENCOUNTER — Inpatient Hospital Stay: Payer: Medicare Other | Attending: Hematology

## 2020-03-14 ENCOUNTER — Telehealth: Payer: Self-pay | Admitting: Hematology

## 2020-03-14 ENCOUNTER — Encounter: Payer: Self-pay | Admitting: Family Medicine

## 2020-03-14 ENCOUNTER — Encounter: Payer: Self-pay | Admitting: Hematology

## 2020-03-14 ENCOUNTER — Encounter: Payer: Self-pay | Admitting: Gastroenterology

## 2020-03-14 ENCOUNTER — Inpatient Hospital Stay (HOSPITAL_BASED_OUTPATIENT_CLINIC_OR_DEPARTMENT_OTHER): Payer: Medicare Other | Admitting: Hematology

## 2020-03-14 ENCOUNTER — Ambulatory Visit: Payer: Medicare Other

## 2020-03-14 ENCOUNTER — Other Ambulatory Visit: Payer: Self-pay

## 2020-03-14 ENCOUNTER — Ambulatory Visit (INDEPENDENT_AMBULATORY_CARE_PROVIDER_SITE_OTHER): Payer: Medicare Other | Admitting: Family Medicine

## 2020-03-14 VITALS — BP 120/73 | HR 92 | Temp 97.8°F | Resp 18 | Ht 65.0 in | Wt 164.0 lb

## 2020-03-14 DIAGNOSIS — K921 Melena: Secondary | ICD-10-CM | POA: Diagnosis not present

## 2020-03-14 DIAGNOSIS — C3491 Malignant neoplasm of unspecified part of right bronchus or lung: Secondary | ICD-10-CM

## 2020-03-14 DIAGNOSIS — D631 Anemia in chronic kidney disease: Secondary | ICD-10-CM | POA: Insufficient documentation

## 2020-03-14 DIAGNOSIS — N1831 Chronic kidney disease, stage 3a: Secondary | ICD-10-CM | POA: Diagnosis not present

## 2020-03-14 DIAGNOSIS — N183 Chronic kidney disease, stage 3 unspecified: Secondary | ICD-10-CM | POA: Diagnosis not present

## 2020-03-14 DIAGNOSIS — D649 Anemia, unspecified: Secondary | ICD-10-CM

## 2020-03-14 DIAGNOSIS — G8929 Other chronic pain: Secondary | ICD-10-CM | POA: Diagnosis not present

## 2020-03-14 DIAGNOSIS — I2693 Single subsegmental pulmonary embolism without acute cor pulmonale: Secondary | ICD-10-CM | POA: Diagnosis not present

## 2020-03-14 DIAGNOSIS — M25512 Pain in left shoulder: Secondary | ICD-10-CM

## 2020-03-14 LAB — CMP (CANCER CENTER ONLY)
ALT: 15 U/L (ref 0–44)
AST: 16 U/L (ref 15–41)
Albumin: 3.9 g/dL (ref 3.5–5.0)
Alkaline Phosphatase: 55 U/L (ref 38–126)
Anion gap: 7 (ref 5–15)
BUN: 19 mg/dL (ref 8–23)
CO2: 22 mmol/L (ref 22–32)
Calcium: 8.6 mg/dL — ABNORMAL LOW (ref 8.9–10.3)
Chloride: 109 mmol/L (ref 98–111)
Creatinine: 1.13 mg/dL — ABNORMAL HIGH (ref 0.44–1.00)
GFR, Est AFR Am: 59 mL/min — ABNORMAL LOW (ref >60)
GFR, Estimated: 51 mL/min — ABNORMAL LOW (ref >60)
Glucose, Bld: 108 mg/dL — ABNORMAL HIGH (ref 70–99)
Potassium: 4.2 mmol/L (ref 3.5–5.1)
Sodium: 138 mmol/L (ref 135–145)
Total Bilirubin: 0.2 mg/dL — ABNORMAL LOW (ref 0.3–1.2)
Total Protein: 7.2 g/dL (ref 6.5–8.1)

## 2020-03-14 LAB — CBC WITH DIFFERENTIAL (CANCER CENTER ONLY)
Abs Immature Granulocytes: 0.02 10*3/uL (ref 0.00–0.07)
Basophils Absolute: 0 10*3/uL (ref 0.0–0.1)
Basophils Relative: 0 %
Eosinophils Absolute: 0.2 10*3/uL (ref 0.0–0.5)
Eosinophils Relative: 3 %
HCT: 31.5 % — ABNORMAL LOW (ref 36.0–46.0)
Hemoglobin: 10.1 g/dL — ABNORMAL LOW (ref 12.0–15.0)
Immature Granulocytes: 0 %
Lymphocytes Relative: 15 %
Lymphs Abs: 1 10*3/uL (ref 0.7–4.0)
MCH: 28.1 pg (ref 26.0–34.0)
MCHC: 32.1 g/dL (ref 30.0–36.0)
MCV: 87.7 fL (ref 80.0–100.0)
Monocytes Absolute: 0.4 10*3/uL (ref 0.1–1.0)
Monocytes Relative: 7 %
Neutro Abs: 4.7 10*3/uL (ref 1.7–7.7)
Neutrophils Relative %: 75 %
Platelet Count: 261 10*3/uL (ref 150–400)
RBC: 3.59 MIL/uL — ABNORMAL LOW (ref 3.87–5.11)
RDW: 20.7 % — ABNORMAL HIGH (ref 11.5–15.5)
WBC Count: 6.3 10*3/uL (ref 4.0–10.5)
nRBC: 0 % (ref 0.0–0.2)

## 2020-03-14 MED ORDER — DARBEPOETIN ALFA 300 MCG/0.6ML IJ SOSY
PREFILLED_SYRINGE | INTRAMUSCULAR | Status: AC
  Filled 2020-03-14: qty 0.6

## 2020-03-14 MED ORDER — HYDROCODONE-ACETAMINOPHEN 7.5-325 MG PO TABS: 1.0000 | ORAL_TABLET | Freq: Three times a day (TID) | ORAL | 0 refills | Status: DC | PRN

## 2020-03-14 MED ORDER — DARBEPOETIN ALFA 300 MCG/0.6ML IJ SOSY
300.0000 ug | PREFILLED_SYRINGE | Freq: Once | INTRAMUSCULAR | Status: AC
  Administered 2020-03-14: 300 ug via SUBCUTANEOUS

## 2020-03-14 NOTE — Progress Notes (Signed)
I saw and examined the patient with Dr. Mayer Masker and agree with assessment and plan as outlined.    Left shoulder impingement, good response to prior subacromial injection.  Will repeat today.  Return as needed.

## 2020-03-14 NOTE — Addendum Note (Signed)
Addended by: [name] on: 03/14/2020 12:03 PM   Modules accepted: Orders

## 2020-03-14 NOTE — Progress Notes (Signed)
Brianna Saunders - 64 y.o. female MRN [id number]  Date of birth: 1955/12/17  Office Visit Note: Visit Date: 03/14/2020 PCP: Marin Olp, MD Referred by: Marin Olp, MD  Subjective: Chief Complaint  Patient presents with  . Left Shoulder - Pain    Pain in shoulder into upper arm x 1 month. Cannot sleep well due to pain. Sleeps with heating pad - eases pain some. Requests cortisone injection. She says she had one in the right shoulder approx 7 months ago - helped.   HPI: Brianna Saunders is a 64 y.o. female who comes in today with left shoulder pain for the past month. She reports that she has known arthritis in her shoulder. No injury or inciting event. Pain with overhead motions and with sleeping on left side. Uses heating pad which helps some. Takes norco at baseline for general pain. She reports that she had injection in her shoulder 7 months ago that provided good pain relief, requesting a corticosteroid injection today.    ROS Otherwise per HPI.  Assessment & Plan: Visit Diagnoses:  1. Chronic left shoulder pain     Plan: Chronic shoulder pain with known arthritis, no acute injury. Patient elected for subacromial corticosteroid injection today. She had moderate relief during anesthetic phase.   Follow-up: PRN  Procedures: Procedure performed: subacromial corticosteroid injection; palpation guided   Noted no overlying erythema, induration, or other signs of local infection. The left posterior subacromial space was palpated and marked. The overlying skin was prepped in a sterile fashion. Topical analgesic spray: Ethyl chloride. Joint: left subacromial Needle: 25 gauge, 1.5 inch Completed without difficulty. Meds: 4 cc 1% lidocaine without epinephrine, 40 mg methylprednisolone  Clinical History: No specialty comments available.   She reports that she quit smoking about 6 months ago. Her smoking use included cigarettes. She started smoking about  50 years ago. She has a 22.50 pack-year smoking history. She has never used smokeless tobacco. No results for input(s): HGBA1C, LABURIC in the last 8760 hours.  Objective:  VS:  HT:    WT:   BMI:     BP:   HR: bpm  TEMP: ( )  RESP:  Physical Exam  PHYSICAL EXAM: Gen: NAD, alert, cooperative with exam, well-appearing HEENT: clear conjunctiva,  CV:  no edema, capillary refill brisk, normal rate Resp: non-labored Skin: no rashes, normal turgor  Neuro: no gross deficits.  Psych:  alert and oriented  Ortho Exam  Shoulder: Inspection reveals no obvious deformity, atrophy, or asymmetry. No bruising. No swelling TTP over posterior and lateral acromion Full ROM in flexion, abduction, limited internal rotation. Pain with flexion, abduction > 120 degrees NV intact distally Normal scapular function observed. Special Tests:  - Impingement: positive Hawkins, neers - Supraspinatous: 5/5 strength with resisted flexion at 20 degrees-- pain with resitance - Infraspinatous/Teres Minor: 5/5 strength with ER - Subscapularis: negative belly press, 5/5 strength with IR  - AC Joint: Negative cross arm  Imaging: No results found.  Past Medical/Family/Surgical/Social History: Medications & Allergies reviewed per EMR, new medications updated. Patient Active Problem List   Diagnosis Date Noted  . Bone metastases ([place]) 02/21/2020  . Dyspnea 01/13/2020  . Goals of care, counseling/discussion 09/23/2019  . Squamous cell carcinoma of lung, right ([place]) 08/31/2019  . Pulmonary embolism ([place]) 08/31/2019  . Normocytic anemia 08/31/2019  . CKD (chronic kidney disease), stage III 08/31/2019  . Liver lesion 08/31/2019  . Left foot drop 07/09/2019  . Vitamin D deficiency 04/08/2019  .  Cervical spinal stenosis 08/27/2018  . Difficulty walking 01/28/2018  . Current smoker 12/18/2017  . Hypertension 11/27/2017  . Chronic pain 11/27/2017  . Major depression in full remission [facility])    Past Medical  History:  Diagnosis Date  . Cancer ([place])   . Chicken pox   . Hypertension    Family History  Problem Relation Age of Onset  . Hypertension Mother   . Heart attack Mother        88  . Hypertension Father   . Kidney disease Father        dialysis  . Kidney disease Brother        dialysis  . Heart attack Maternal Grandmother   . Early death Sister        died at 58 months   Past Surgical History:  Procedure Laterality Date  . FRACTURE SURGERY    . PARTIAL HYSTERECTOMY    . SPINE SURGERY     lumbar spine- around 2014  . upper back surgery     states plate in neck and into right shoulder blade reportedly- around 2014  . VIDEO BRONCHOSCOPY Bilateral 09/02/2019   Procedure: VIDEO BRONCHOSCOPY WITH FLUORO;  Surgeon: Collene Gobble, MD;  Location: [facility];  Service: Cardiopulmonary;  Laterality: Bilateral;   Social History   Occupational History  . Not on file  Tobacco Use  . Smoking status: Former Smoker    Packs/day: 0.50    Years: 45.00    Pack years: 22.50    Types: Cigarettes    Start date: 11/11/1969    Quit date: 09/01/2019    Years since quitting: 0.5  . Smokeless tobacco: Never Used  . Tobacco comment: 2 cigarettes a day  Substance and Sexual Activity  . Alcohol use: No  . Drug use: No  . Sexual activity: Never

## 2020-03-14 NOTE — Telephone Encounter (Signed)
Appointments scheduled calendar printed per 5/4 los

## 2020-03-14 NOTE — Progress Notes (Signed)
Aranesp only per MD

## 2020-03-14 NOTE — Patient Instructions (Signed)
Darbepoetin Alfa injection What is this medicine? DARBEPOETIN ALFA (dar be POE e tin AL fa) helps your body make more red blood cells. It is used to treat anemia caused by chronic kidney failure and chemotherapy. This medicine may be used for other purposes; ask your health care provider or pharmacist if you have questions. COMMON BRAND NAME(S): Aranesp What should I tell my health care provider before I take this medicine? They need to know if you have any of these conditions:  blood clotting disorders or history of blood clots  cancer patient not on chemotherapy  cystic fibrosis  heart disease, such as angina, heart failure, or a history of a heart attack  hemoglobin level of 12 g/dL or greater  high blood pressure  low levels of folate, iron, or vitamin B12  seizures  an unusual or allergic reaction to darbepoetin, erythropoietin, albumin, hamster proteins, latex, other medicines, foods, dyes, or preservatives  pregnant or trying to get pregnant  breast-feeding How should I use this medicine? This medicine is for injection into a vein or under the skin. It is usually given by a health care professional in a hospital or clinic setting. If you get this medicine at home, you will be taught how to prepare and give this medicine. Use exactly as directed. Take your medicine at regular intervals. Do not take your medicine more often than directed. It is important that you put your used needles and syringes in a special sharps container. Do not put them in a trash can. If you do not have a sharps container, call your pharmacist or healthcare provider to get one. A special MedGuide will be given to you by the pharmacist with each prescription and refill. Be sure to read this information carefully each time. Talk to your pediatrician regarding the use of this medicine in children. While this medicine may be used in children as young as 1 month of age for selected conditions, precautions do  apply. Overdosage: If you think you have taken too much of this medicine contact a poison control center or emergency room at once. NOTE: This medicine is only for you. Do not share this medicine with others. What if I miss a dose? If you miss a dose, take it as soon as you can. If it is almost time for your next dose, take only that dose. Do not take double or extra doses. What may interact with this medicine? Do not take this medicine with any of the following medications:  epoetin alfa This list may not describe all possible interactions. Give your health care provider a list of all the medicines, herbs, non-prescription drugs, or dietary supplements you use. Also tell them if you smoke, drink alcohol, or use illegal drugs. Some items may interact with your medicine. What should I watch for while using this medicine? Your condition will be monitored carefully while you are receiving this medicine. You may need blood work done while you are taking this medicine. This medicine may cause a decrease in vitamin B6. You should make sure that you get enough vitamin B6 while you are taking this medicine. Discuss the foods you eat and the vitamins you take with your health care professional. What side effects may I notice from receiving this medicine? Side effects that you should report to your doctor or health care professional as soon as possible:  allergic reactions like skin rash, itching or hives, swelling of the face, lips, or tongue  breathing problems  changes in   vision  chest pain  confusion, trouble speaking or understanding  feeling faint or lightheaded, falls  high blood pressure  muscle aches or pains  pain, swelling, warmth in the leg  rapid weight gain  severe headaches  sudden numbness or weakness of the face, arm or leg  trouble walking, dizziness, loss of balance or coordination  seizures (convulsions)  swelling of the ankles, feet, hands  unusually weak or  tired Side effects that usually do not require medical attention (report to your doctor or health care professional if they continue or are bothersome):  diarrhea  fever, chills (flu-like symptoms)  headaches  nausea, vomiting  redness, stinging, or swelling at site where injected This list may not describe all possible side effects. Call your doctor for medical advice about side effects. You may report side effects to FDA at 1-800-FDA-1088. Where should I keep my medicine? Keep out of the reach of children. Store in a refrigerator between 2 and 8 degrees C (36 and 46 degrees F). Do not freeze. Do not shake. Throw away any unused portion if using a single-dose vial. Throw away any unused medicine after the expiration date. NOTE: This sheet is a summary. It may not cover all possible information. If you have questions about this medicine, talk to your doctor, pharmacist, or health care provider.  2020 Elsevier/Gold Standard (2017-11-12 16:44:20)  

## 2020-03-14 NOTE — Progress Notes (Signed)
[place] OFFICE PROGRESS NOTE  Patient Care Team: Brianna Olp, MD as PCP - General (Family Medicine) [name], MD as Medical Oncologist (Oncology) Cordelia Poche, RN as Oncology Nurse Navigator  HEME/ONC OVERVIEW: 1. 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 LLL PTE and bilateral LE DVT -PTE in the LLL, as well as acute DVT in R posterior tibial and L popliteal veins in 08/2019  -08/2019 - present: Xarelto 268mdaily   TREATMENT SUMMARY:  08/2019 - present: Xarelto 2067maily  09/24/2019 - present: osimertinib 37m23mily   11/10/2019 - present: q4weMarchelle Folks2/30/2020 - 12/07/2019, 03/14/2020 - present: q4weeks Aranesp due to CKD and treatment-related anemia, currently 300mc54m4/14/2021 - 03/07/2020: palliative RT to the left hip metastasis   PRN IV iron, last dose on 10/12/2019   ASSESSMENT & PLAN:   Stage IV (cT4N([id number] cell carcinoma of the R upper lung, EGFR exon 19 deletion -Currently tolerating Tagrisso relatively well -Labs reviewed and adequate, continue with Tagrisso 37mg 11my  -Due to some of the nonspecific symptoms, including dyspnea with exertion, low back pain, and leg weakness, I have ordered CT chest, abdomen and pelvis with contrast to assess disease response, as well as CT lumbar spine to rule out any nerve compression -PRN anti-emetics: Zofran, Compazine  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  -S/p 2 weeks of  palliative RT to the left hip metastasis; left hip pain improving  -Currently taking Norco ~2x/day -With improvement in her left hip pain and mild to moderate sedation, I have reduced her Norco from 10/325 to 7.5/325 q8hrs PRN -I encouraged patient to take the pain medication as needed to avoid excess sedation -Continue monthly Xgeva -On Ca-Vit D supplement daily  Melena -Etiology unclear; ddx includes occult GI bleeding vs oral iron supplement -Hgb 11.6 in late 01/2020 to 10.1 today -Despite discontinuation of oral iron supplement x 1 week, patient continues to have black tarry stool, raising concern for occult GI bleeding -I have placed urgent referral to gastroenterology for work-up to rule out GI bleeding -If the patient develops more apparent symptoms of bleeding, such as hematemesis, hemoptysis, hematochezia, or hematuria, she is instructed to stop Xarelto  Acute LLL PTE and bilateral DVT -Currently on Eliquis; patient denies any abnormal bleeding or bruising -Goal of anticoagulation is lifelong -Continue Xarelto 20mg d73m for now, pending GI work-up  Normocytic anemia -Most likely multifactorial, including anemia of chronic disease, treatment-related anemia, and possible GI bleeding -Hgb 10.1 today, significantly lower; patient denies any symptoms of bleeding -Aranesp was discontinued in late 11/2019 with improvement in Hgb -However, given the drop in hemoglobin, I will resume Aranesp 300mcg q78mks -In addition, I have ordered iron panel for the next visit to assess any need for IV iron infusion -Gastroenterology referral as outlined above  Stage III CKD -Cr fluctuating between 1.2 and 1.4 since 2019  -Cr 1.13 today, stable; electrolytes normal -Patient was counseled to avoid NSAIDs  -We will monitor  it closely   Orders Placed This Encounter  Procedures  . CT CHEST W CONTRAST    Standing Status:   Future    Standing Expiration Date:   03/14/2021    Order Specific  Question:   ** REASON FOR EXAM (FREE TEXT)    Answer:   Met lung cancer on Tagrisso    Order Specific Question:   If indicated for the ordered procedure, I authorize the administration of contrast media per Radiology protocol    Answer:   Yes    Order Specific Question:   Preferred imaging location?    Answer:   [facility] Specific Question:   Radiology Contrast Protocol - do NOT remove file path    Answer:   \\[email or web address]  . CT ABDOMEN PELVIS W CONTRAST    Standing Status:   Future    Standing Expiration Date:   03/14/2021    Order Specific Question:   ** REASON FOR EXAM (FREE TEXT)    Answer:   Met lung cancer    Order Specific Question:   If indicated for the ordered procedure, I authorize the administration of contrast media per Radiology protocol    Answer:   Yes    Order Specific Question:   Preferred imaging location?    Answer:   [facility] Specific Question:   Is Oral Contrast requested for this exam?    Answer:   Yes, Per Radiology protocol    Order Specific Question:   Radiology Contrast Protocol - do NOT remove file path    Answer:   \\[email or web address]  . CT Lumbar Spine W Contrast    Standing Status:   Future    Standing Expiration Date:   03/14/2021    Order Specific Question:   ** REASON FOR EXAM (FREE TEXT)    Answer:   Met lung cancer with pain in the low back, causing left leg weakness    Order Specific Question:   If indicated for the ordered procedure, I authorize the administration of contrast media per Radiology protocol    Answer:   Yes    Order Specific Question:   Preferred imaging location?    Answer:   [facility] Specific Question:   Radiology Contrast Protocol - do NOT remove file path    Answer:   \\[email or web address]  . Ambulatory referral to Gastroenterology    Referral Priority:   Urgent    Referral Type:   Consultation     Referral Reason:   Specialty Services Required    Referred to Provider:   Lavena Bullion, DO    Number of Visits Requested:   1    The total time spent in the encounter was 45 minutes, including face-to-face time with the patient, review of various tests results, order additional studies/medications, documentation, and coordination of care plan.   All questions were answered. The patient knows to call the clinic with any problems, questions or concerns. No barriers to learning was detected.  Return in 4 weeks for labs, clinic appointment, and injection appointment Delton See + Aranesp).  Sooner if CT shows disease progression.  [name], MD 5/4/202111:50 AM  CHIEF COMPLAINT: "I am just really tired"  INTERVAL HISTORY: Ms. Blowe returns to clinic for follow-up of metastatic squamous cell carcinoma of the lung on Tagrisso.  Patient was accompanied by his daughter.  According to the patient and her daughter, she has been  more fatigued over the past several week.  Her left hip pain is improving since completing palliative radiation, but she still takes Norco 10/325 approximately twice a day.  She appears more drowsy today in clinic.  She also has been having black tarry stool for the past several weeks.  Her daughter initially thought that it could be due to iron supplement, but despite stopping oral iron supplement for over a week, she still has black tarry stools.  She denies any abdominal pain, nausea, vomiting, hematochezia, hematuria, or hematemesis.  She still has mild to moderate left leg weakness, and requires wheelchair at home.  She denies any other new neurologic deficits.  REVIEW OF SYSTEMS:   Constitutional: ( - ) fevers, ( - )  chills , ( - ) night sweats Eyes: ( - ) blurriness of vision, ( - ) double vision, ( - ) watery eyes Ears, nose, mouth, throat, and face: ( - ) mucositis, ( - ) sore throat Respiratory: ( - ) cough, ( + ) dyspnea, ( - ) wheezes Cardiovascular: ( - )  palpitation, ( - ) chest discomfort, ( + ) lower extremity swelling Gastrointestinal:  ( - ) nausea, ( - ) heartburn, ( - ) change in bowel habits Skin: ( - ) abnormal skin rashes Lymphatics: ( - ) new lymphadenopathy, ( - ) easy bruising Neurological: ( - ) numbness, ( - ) tingling, ( - ) new weaknesses Behavioral/Psych: ( - ) mood change, ( - ) new changes  All other systems were reviewed with the patient and are negative.  SUMMARY OF ONCOLOGIC HISTORY: Oncology History  Squamous cell carcinoma of lung, right ([place])  08/31/2019 Initial Diagnosis   Squamous cell carcinoma of lung, right ([place])   08/31/2019 Imaging   CT chest w/ contrast: IMPRESSION: 1. Large poorly defined partially cavitary mass in the right upper lobe measuring approximately 4.1 x 6.4 x 3.8 cm, contiguous with ill-defined mass and/or adenopathy within the mediastinum. 1.9 cm pedunculated filling defect within the right aspect of the left atrium, appears contiguous with mediastinal soft tissue density and is concerning for left atrial invasion. There is narrowing and partial encasement of right upper lobe pulmonary arteries. 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 2. Small nonocclusive thrombus within subsegmental left lower lobe pulmonary artery. 3. 12 mm irregular right upper lobe pulmonary nodule which is either infectious, inflammatory, or neoplastic. Multifocal ill-defined consolidations in the right upper and middle lobes with diffuse ground-glass density in the right lower lobe, findings could be secondary to pneumonia, with consideration given to associated pulmonary infarcts given occluded appearance of right middle and lower lobe pulmonary  arteries. 4. Numerous hypodense liver lesions, some of which are consistent with cysts. Given findings in the chest, recommend correlation with nonemergent MRI. 5. Incompletely visualized age indeterminate fracture inferior endplate T12 6. Small pericardial effusion   09/01/2019 Imaging   Doppler: Summary: Right: Findings consistent with acute deep vein thrombosis involving the right posterior tibial veins. No cystic structure found in the popliteal fossa. Left: Findings consistent with acute deep vein thrombosis involving the left popliteal vein. No cystic structure found in the popliteal fossa.   09/02/2019 Imaging   MRI brain w/ contrast:IMPRESSION: Negative for metastatic disease to the brain.  No acute abnormality.  09/02/2019 Pathology Results   FINAL MICROSCOPIC DIAGNOSIS:   A. LUNG, RIGHT UPPER LOBE, BIOPSY:  - Benign bronchial epithelium.    09/02/2019 Pathology Results   Clinical History: Transbronchial brushings, RUL  Specimen Submitted:  A. LUNG, RIGHT UPPER LOBE, BRUSHINGS:    FINAL MICROSCOPIC DIAGNOSIS:  - Malignant cells consistent with squamous cell carcinoma    09/02/2019 Imaging   Echocardiogram: IMPRESSIONS      1. Left ventricular ejection fraction, by visual estimation, is 60 to 65%. The left ventricle has normal function. There is mildly increased left ventricular hypertrophy.  2. Left ventricular diastolic Doppler parameters are consistent with impaired relaxation pattern of LV diastolic filling.  3. Global right ventricle has mildly reduced systolic function.The right ventricular size is normal. No increase in right ventricular wall thickness.  4. Left atrial size was normal.  5. Right atrial size was normal.  6. The mitral valve is grossly normal. Trace mitral valve regurgitation.  7. The tricuspid valve is grossly normal. Tricuspid valve regurgitation was not visualized by color flow Doppler.  8. The aortic valve is tricuspid Aortic valve  regurgitation was not visualized by color flow Doppler.  9. The pulmonic valve was grossly normal. Pulmonic valve regurgitation is not visualized by color flow Doppler. 10. The inferior vena cava is normal in size with <50% respiratory variability, suggesting right atrial pressure of 8 mmHg. 11. Small pericardial effusion. 12. The pericardial effusion is RV - inferior wall.   09/02/2019 Imaging   MRI brain: IMPRESSION: Negative for metastatic disease to the brain.  No acute abnormality.    09/03/2019 Imaging   MRI abdomen: IMPRESSION: 1. Severely motion degraded MRI, substantially limiting evaluation. 2. Enhancing L3 vertebral and medial right iliac T2 hypointense bone lesions, concerning for bone metastases. PET-CT strongly recommended on a short term outpatient basis for further characterization. 3. Numerous T2 hyperintense lesions scattered throughout the liver, without definite enhancement on the severely motion degraded postcontrast sequences, presumably benign liver cysts. The liver can be better staged on PET-CT.   09/21/2019 Imaging   PET: IMPRESSION: 5.4 cm medial right upper lobe mass, corresponding to the patient's known primary bronchogenic neoplasm. Associated direct extension to the right perihilar region and left atrium.   Associated right perihilar and low right paratracheal nodal metastases.   Multifocal right lung opacities, likely reflecting a combination of tumor and postobstructive opacity/infection. Trace right pleural effusion.   Osseous metastases involving the L3 vertebral body, right iliac bone, and left femoral head/neck.   Otherwise, no evidence of metastatic disease in the abdomen/pelvis.     I have reviewed the past medical history, past surgical history, social history and family history with the patient and they are unchanged from previous note.  ALLERGIES:  has No Known Allergies.  MEDICATIONS:  Current Outpatient Medications   Medication Sig Dispense Refill  . albuterol (VENTOLIN HFA) 108 (90 Base) MCG/ACT inhaler INHALE 2 PUFFS BY MOUTH EVERY 6 HOURS AS NEEDED FOR WHEEZING AND SHORTNESS OF BREATH 18 g 2  . amLODipine (NORVASC) 10 MG tablet Take 10 mg by mouth every morning.    . baclofen (LIORESAL) 10 MG tablet TAKE 1 TABLET BY MOUTH THREE TIMES A DAY AS NEEDED FOR MUSCLE SPASMS (Patient taking differently: Take 10 mg by mouth 3 (three) times daily. ) 270 tablet 1  . benzonatate (TESSALON) 100 MG capsule Take 1 capsule (100 mg total) by mouth 3 (three) times daily as needed for cough. 60 capsule 2  . buPROPion (WELLBUTRIN XL) 150  MG 24 hr tablet TAKE 2 TABLETS (300 MG TOTAL) BY MOUTH DAILY. 180 tablet 2  . cefpodoxime (VANTIN) 100 MG tablet Take 100 mg by mouth every morning.    . chlorhexidine (PERIDEX) 0.12 % solution Rinse with 15 mls twice daily for 30 seconds. Use after breakfast and at bedtime. Spit out excess. Do not swallow. 480 mL prn  . citalopram (CELEXA) 40 MG tablet Take 1 tablet (40 mg total) by mouth daily. 90 tablet 1  . gabapentin (NEURONTIN) 300 MG capsule TAKE 2 CAPSULES (600 MG TOTAL) BY MOUTH 3 (THREE) TIMES DAILY. 180 capsule 1  . guaiFENesin-dextromethorphan (ROBITUSSIN DM) 100-10 MG/5ML syrup Take 5 mLs by mouth every 4 (four) hours as needed for cough. 120 mL 1  . lisinopril (ZESTRIL) 40 MG tablet Take 40 mg by mouth every morning.    . metoprolol tartrate (LOPRESSOR) 50 MG tablet Take 1 tablet (50 mg total) by mouth 2 (two) times daily. 180 tablet 1  . nicotine (NICODERM CQ - DOSED IN MG/24 HOURS) 14 mg/24hr patch Place 1 patch (14 mg total) onto the skin daily. 28 patch 1  . ondansetron (ZOFRAN) 8 MG tablet Take 1 tablet (8 mg total) by mouth every 8 (eight) hours as needed for nausea or vomiting. 40 tablet 2  . osimertinib mesylate (TAGRISSO) 80 MG tablet Take 80 mg by mouth every morning.    . prochlorperazine (COMPAZINE) 10 MG tablet Take 1 tablet (10 mg total) by mouth every 8 (eight)  hours as needed for nausea or vomiting. 40 tablet 2  . Vitamin D, Ergocalciferol, (DRISDOL) 1.25 MG (50000 UT) CAPS capsule TAKE 1 CAPSULE (50,000 UNITS TOTAL) BY MOUTH EVERY 7 (SEVEN) DAYS. 12 capsule 0  . XARELTO 20 MG TABS tablet TAKE 1 TABLET BY MOUTH DAILY WITH SUPPER START AFTER STARTER PACK 90 tablet 1  . HYDROcodone-acetaminophen (NORCO) 7.5-325 MG tablet Take 1 tablet by mouth every 8 (eight) hours as needed for moderate pain. 60 tablet 0   No current facility-administered medications for this visit.   Facility-Administered Medications Ordered in Other Visits  Medication Dose Route Frequency Provider Last Rate Last Admin  . Darbepoetin Alfa (ARANESP) injection 300 mcg  300 mcg Subcutaneous Once [name], MD        PHYSICAL EXAMINATION: ECOG PERFORMANCE STATUS: 2 - Symptomatic, <50% confined to bed  Today's Vitals   03/14/20 1115  BP: 120/73  Pulse: 92  Resp: 18  Temp: 97.8 F (36.6 C)  TempSrc: Temporal  SpO2: 100%  Weight: 164 lb (74.4 kg)  Height: 5' 5"  (1.651 m)  PainSc: 0-No pain   Body mass index is 27.29 kg/m.  Filed Weights   03/14/20 1115  Weight: 164 lb (74.4 kg)    GENERAL: alert, no distress and comfortable, mildly sedated  SKIN: skin color, texture, turgor are normal, no rashes or significant lesions EYES: conjunctiva are pink and non-injected, sclera clear OROPHARYNX: no exudate, no erythema; lips, buccal mucosa, and tongue normal  NECK: supple, non-tender LUNGS: clear to auscultation with normal breathing effort HEART: regular rate & rhythm and no murmurs and no lower extremity edema ABDOMEN: soft, non-tender, non-distended, normal bowel sounds Musculoskeletal: no cyanosis of digits and no clubbing  PSYCH: mildly drowsy, slowed speech   LABORATORY DATA:  I have reviewed the data as listed    Component Value Date/Time   NA 138 03/14/2020 1053   K 4.2 03/14/2020 1053   CL 109 03/14/2020 1053   CO2 22 03/14/2020 1053  GLUCOSE 108 (H)  03/14/2020 1053   BUN 19 03/14/2020 1053   CREATININE 1.13 (H) 03/14/2020 1053   CALCIUM 8.6 (L) 03/14/2020 1053   PROT 7.2 03/14/2020 1053   ALBUMIN 3.9 03/14/2020 1053   AST 16 03/14/2020 1053   ALT 15 03/14/2020 1053   ALKPHOS 55 03/14/2020 1053   BILITOT 0.2 (L) 03/14/2020 1053   GFRNONAA 51 (L) 03/14/2020 1053   GFRAA 59 (L) 03/14/2020 1053    No results found for: SPEP, UPEP  Lab Results  Component Value Date   WBC 6.3 03/14/2020   NEUTROABS 4.7 03/14/2020   HGB 10.1 (L) 03/14/2020   HCT 31.5 (L) 03/14/2020   MCV 87.7 03/14/2020   PLT 261 03/14/2020      Chemistry      Component Value Date/Time   NA 138 03/14/2020 1053   K 4.2 03/14/2020 1053   CL 109 03/14/2020 1053   CO2 22 03/14/2020 1053   BUN 19 03/14/2020 1053   CREATININE 1.13 (H) 03/14/2020 1053      Component Value Date/Time   CALCIUM 8.6 (L) 03/14/2020 1053   ALKPHOS 55 03/14/2020 1053   AST 16 03/14/2020 1053   ALT 15 03/14/2020 1053   BILITOT 0.2 (L) 03/14/2020 1053       RADIOGRAPHIC STUDIES: I have personally reviewed the radiological images as listed below and agreed with the findings in the report. No results found.

## 2020-03-15 ENCOUNTER — Other Ambulatory Visit: Payer: Self-pay | Admitting: *Deleted

## 2020-03-15 MED ORDER — ALBUTEROL SULFATE HFA 108 (90 BASE) MCG/ACT IN AERS: INHALATION_SPRAY | RESPIRATORY_TRACT | 11 refills | Status: AC

## 2020-03-20 ENCOUNTER — Ambulatory Visit (HOSPITAL_BASED_OUTPATIENT_CLINIC_OR_DEPARTMENT_OTHER)
Admission: RE | Admit: 2020-03-20 | Discharge: 2020-03-20 | Disposition: A | Discharge status: Home / Self Care | Payer: Medicare Other | Source: Ambulatory Visit | Attending: Hematology | Admitting: Hematology

## 2020-03-20 ENCOUNTER — Other Ambulatory Visit: Payer: Self-pay

## 2020-03-20 DIAGNOSIS — C3491 Malignant neoplasm of unspecified part of right bronchus or lung: Secondary | ICD-10-CM

## 2020-03-20 DIAGNOSIS — C7951 Secondary malignant neoplasm of bone: Secondary | ICD-10-CM | POA: Diagnosis not present

## 2020-03-20 DIAGNOSIS — C3490 Malignant neoplasm of unspecified part of unspecified bronchus or lung: Secondary | ICD-10-CM | POA: Diagnosis not present

## 2020-03-20 MED ORDER — IOHEXOL 300 MG/ML  SOLN
100.0000 mL | Freq: Once | INTRAMUSCULAR | Status: AC | PRN
  Administered 2020-03-20: 14:00:00 100 mL via INTRAVENOUS

## 2020-03-21 ENCOUNTER — Other Ambulatory Visit: Payer: Self-pay | Admitting: Hematology

## 2020-03-21 ENCOUNTER — Ambulatory Visit (INDEPENDENT_AMBULATORY_CARE_PROVIDER_SITE_OTHER): Payer: Medicare Other | Admitting: Family Medicine

## 2020-03-21 ENCOUNTER — Encounter: Payer: Self-pay | Admitting: Family Medicine

## 2020-03-21 VITALS — BP 128/80 | HR 99 | Temp 98.3°F | Ht 65.0 in | Wt 160.0 lb

## 2020-03-21 DIAGNOSIS — R05 Cough: Secondary | ICD-10-CM

## 2020-03-21 DIAGNOSIS — C3491 Malignant neoplasm of unspecified part of right bronchus or lung: Secondary | ICD-10-CM | POA: Diagnosis not present

## 2020-03-21 DIAGNOSIS — F331 Major depressive disorder, recurrent, moderate: Secondary | ICD-10-CM | POA: Diagnosis not present

## 2020-03-21 DIAGNOSIS — I825Y3 Chronic embolism and thrombosis of unspecified deep veins of proximal lower extremity, bilateral: Secondary | ICD-10-CM

## 2020-03-21 DIAGNOSIS — M4802 Spinal stenosis, cervical region: Secondary | ICD-10-CM

## 2020-03-21 DIAGNOSIS — I700 Atherosclerosis of aorta: Secondary | ICD-10-CM | POA: Insufficient documentation

## 2020-03-21 DIAGNOSIS — H5213 Myopia, bilateral: Secondary | ICD-10-CM | POA: Diagnosis not present

## 2020-03-21 DIAGNOSIS — R059 Cough, unspecified: Secondary | ICD-10-CM

## 2020-03-21 DIAGNOSIS — I2693 Single subsegmental pulmonary embolism without acute cor pulmonale: Secondary | ICD-10-CM | POA: Diagnosis not present

## 2020-03-21 DIAGNOSIS — I82409 Acute embolism and thrombosis of unspecified deep veins of unspecified lower extremity: Secondary | ICD-10-CM | POA: Insufficient documentation

## 2020-03-21 DIAGNOSIS — I10 Essential (primary) hypertension: Secondary | ICD-10-CM

## 2020-03-21 MED ORDER — DULOXETINE HCL 30 MG PO CPEP: ORAL_CAPSULE | ORAL | 3 refills | Status: AC

## 2020-03-21 MED ORDER — METOPROLOL TARTRATE 25 MG PO TABS: 25.0000 mg | ORAL_TABLET | Freq: Two times a day (BID) | ORAL | 3 refills | Status: AC

## 2020-03-21 NOTE — Progress Notes (Signed)
Phone [phone] In person visit     I,Brianna Saunders,acting as a scribe for Brianna Reddish, MD.,have documented all relevant documentation on the behalf of Brianna Reddish, MD,as directed by  Brianna Reddish, MD while in the presence of Brianna Reddish, MD.  Subjective:   Brianna Saunders is a 64 y.o. year old very pleasant female patient who presents for/with See problem oriented charting Chief Complaint  Patient presents with  . Hypertension  . Depression   This visit occurred during the SARS-CoV-2 public health emergency.  Safety protocols were in place, including screening questions prior to the visit, additional usage of staff PPE, and extensive cleaning of exam room while observing appropriate contact time as indicated for disinfecting solutions.   Past Medical History-  Patient Active Problem List   Diagnosis Date Noted  . DVT (deep venous thrombosis) (Brianna Saunders) 03/21/2020    Priority: High  . Squamous cell carcinoma of lung, right (Brianna Saunders) 08/31/2019    Priority: High  . Pulmonary embolism (Brianna Saunders) 08/31/2019    Priority: High  . Cervical spinal stenosis 08/27/2018    Priority: High  . Chronic pain 11/27/2017    Priority: High  . Aortic atherosclerosis (Brianna Saunders) 03/21/2020    Priority: Medium  . CKD (chronic kidney disease), stage III 08/31/2019    Priority: Medium  . Hypertension 11/27/2017    Priority: Medium  . Depression, major, recurrent, moderate (Brianna Saunders)     Priority: Medium  . Goals of care, counseling/discussion 09/23/2019    Priority: Low  . Vitamin D deficiency 04/08/2019    Priority: Low  . Bone metastases (Brianna Saunders) 02/21/2020  . Dyspnea 01/13/2020  . Normocytic anemia 08/31/2019  . Liver lesion 08/31/2019  . Left foot drop 07/09/2019  . Difficulty walking 01/28/2018  . Current smoker 12/18/2017    Medications- reviewed and updated Current Outpatient Medications  Medication Sig Dispense Refill  . albuterol (VENTOLIN HFA) 108 (90 Base) MCG/ACT inhaler  INHALE 2 PUFFS BY MOUTH EVERY 6 HOURS AS NEEDED FOR WHEEZING AND SHORTNESS OF BREATH 18 g 11  . baclofen (LIORESAL) 10 MG tablet TAKE 1 TABLET BY MOUTH THREE TIMES A DAY AS NEEDED FOR MUSCLE SPASMS (Patient taking differently: Take 10 mg by mouth 3 (three) times daily. ) 270 tablet 1  . benzonatate (TESSALON) 100 MG capsule Take 1 capsule (100 mg total) by mouth 3 (three) times daily as needed for cough. 60 capsule 2  . buPROPion (WELLBUTRIN XL) 150 MG 24 hr tablet TAKE 2 TABLETS (300 MG TOTAL) BY MOUTH DAILY. 180 tablet 2  . gabapentin (NEURONTIN) 300 MG capsule TAKE 2 CAPSULES (600 MG TOTAL) BY MOUTH 3 (THREE) TIMES DAILY. 180 capsule 1  . guaiFENesin-dextromethorphan (ROBITUSSIN DM) 100-10 MG/5ML syrup Take 5 mLs by mouth every 4 (four) hours as needed for cough. 120 mL 1  . HYDROcodone-acetaminophen (NORCO) 7.5-325 MG tablet Take 1 tablet by mouth every 8 (eight) hours as needed for moderate pain. 60 tablet 0  . ondansetron (ZOFRAN) 8 MG tablet Take 1 tablet (8 mg total) by mouth every 8 (eight) hours as needed for nausea or vomiting. 40 tablet 2  . osimertinib mesylate (TAGRISSO) 80 MG tablet Take 80 mg by mouth every morning.    . prochlorperazine (COMPAZINE) 10 MG tablet Take 1 tablet (10 mg total) by mouth every 8 (eight) hours as needed for nausea or vomiting. 40 tablet 2  . XARELTO 20 MG TABS tablet TAKE 1 TABLET BY MOUTH DAILY WITH SUPPER START AFTER STARTER PACK 90 tablet 1  .  DULoxetine (CYMBALTA) 30 MG capsule Take one 30 mg tablet by mouth once a day for the first week. Then increase to two 30 mg tablets ( total 60mg ) by mouth once daily. 60 capsule 3  . metoprolol tartrate (LOPRESSOR) 25 MG tablet Take 1 tablet (25 mg total) by mouth 2 (two) times daily. 180 tablet 3   No current facility-administered medications for this visit.     Objective:  BP 128/80 (BP Location: Left Arm, Patient Position: Sitting, Cuff Size: Normal)   Pulse 99   Temp 98.3 F (36.8 C) (Temporal)   Ht 5\' 5"   (1.651 m)   Wt 160 lb (72.6 kg)   SpO2 97%   BMI 26.63 kg/m  Gen: NAD, resting comfortably CV: RRR no murmurs rubs or gallops Lungs: CTAB no crackles, wheeze, rhonchi Ext: no edema Skin: warm, dry      Assessment and Plan   #Cervical spinal stenosis/chronic pain S: Patient with long-term history of chronic pain related to cervical spinal stenosis-see prior MRI reports.  I have not seen patient recently as she has been dealing with lung cancer-fortunately she feels well enough today to discuss her more chronic issues  Patient states she has pain  in both sides of low back and radiates into the left leg mostly. Also feels cold to touch on that side. No recent changes in pain but just ongoing pain.   Has been on gabapentin 1800mg  total per day for several years- daughter and patient wondering about other treatment options/meds due to ongoing pain. Pain lung cancer is improving pain levels in that area.   A/P: Patient previously has been seen by Dr. Paulla Fore of sports medicine.  He had recommended considering referral to [place] [facility] with Dr. Maryjean Ka.  He wanted to explore the idea of spinal cord stimulator potentially.  I reviewed chart after visit and it does appear patient has seen Dr. Maryjean Ka in the past-we will get her reestablished. -With chronic pain we opted to switch from prior citalopram which she had stopped on her own it appears during cancer treatments to Cymbalta to see if that helps with her pain  # Left shoulder pain S:recent injection without ultrasound. She had better success with ultrasound  A/P: Patient asks for her next injection if she can be referred back to Dr. Lavonia Dana am perfectly fine placing referral to Dr. Paulla Fore as I believe he can accept Medicare at this point   #hypertension S: medication: Patient had previously been on amlodipine 10 mg and metoprolol 50 mg twice daily.  She also had been on lisinopril previously but is now off Home readings #s: Peak she  has seen has been in the 150s or 160s at other offices BP Readings from Last 3 Encounters:  03/21/20 128/80  03/14/20 120/73  02/21/20 (!) 164/82  A/P: From AVS: "Blood pressure ok today but sinc eyou have had some #s into 150s lets restart metoprolol at lower dose 25 mg twice daily. I am going to hold off on amlodipine for now given prior swelling issues but we may restart it at a lower dose at a later date. Please monitor blood pressure at home and if over 140/90 on average then please let us know"  # DVT/PE-at time of diagnosis of lung cancer S: Pulmonary embolism noted on CT of chest 08/31/2019.Patient diagnosed with right-sided DVT September 01, 2019.  Involving right posterior tibial veins.  She also had left popliteal vein DVT.    Currently she is compliant with xarelto  20 mg ongoing while cancer is active-I would be concerned about stopping and recurrence without A/P: Chronic DVT/PE-ongoing need for anticoagulation given ongoing cancer-we will continue Xarelto at this time. Crcl above 30 so should be on full dose xarelto for dvt/pe indication  # Depression S: Medication: compliant with wellbutrin 300mg . Had bene on celexa 40mg  last year but looks like she ran out of this and has not been refilled since likely December.  Depression screen [facility] 2/9 03/21/2020 03/16/2019 08/27/2018  Decreased Interest 1 0 1  Down, Depressed, Hopeless 2 0 3  PHQ - 2 Score 3 0 4  Altered sleeping 3 0 0  Tired, decreased energy 3 3 2   Change in appetite 3 3 3   Feeling bad or failure about yourself  1 0 1  Trouble concentrating 3 0 0  Moving slowly or fidgety/restless 3 0 0  Suicidal thoughts 0 0 0  PHQ-9 Score 19 6 10   Difficult doing work/chores Not difficult at all Not difficult at all -   A/P: Depression is poorly controlled with PHQ-9 of 19 today.  It looks like she has not been able to take the Celexa recently-due to chronic pain we opted to try Cymbalta instead.  I recommended a follow-up in 6 weeks to  check in.  If she develops any thoughts of self-harm she will let us know immediately -I also gave her information for counseling with about behavioral health  #CKD stage III-GFR been above 50.  Patient knows to avoid NSAIDs.  Continue to monitor/control blood pressure.  She is having labs followed by oncology.  Continue Tylenol as needed for pain in addition to gabapentin and Cymbalta being added  Recommended follow up: Return in about 6 weeks (around 05/02/2020) for follow up- or sooner if needed. Future Appointments  Date Time Provider [place]  04/06/2020  4:15 PM Gery Pray, MD [facility] None  04/12/2020  1:45 PM [facility] LAB [facility] None  04/12/2020  2:00 PM Ennever, Rudell Cobb, MD [facility] None  04/12/2020  2:30 PM [facility] INJ NURSE [facility] None  05/03/2020 11:00 AM Cirigliano, Vito V, DO [facility]    Lab/Order associations:   ICD-10-CM   1. Cervical spinal stenosis  M48.02 Ambulatory referral to Neurosurgery  2. Essential hypertension  I10   3. Single subsegmental pulmonary embolism without acute cor pulmonale (HCC)  I26.93   4. Chronic deep vein thrombosis (DVT) of proximal vein of both lower extremities (HCC)  I82.5Y3   5. Depression, major, recurrent, moderate ([place])  F33.1   6. Squamous cell carcinoma of lung, right (HCC)  C34.91   7. Aortic atherosclerosis (HCC)  I70.0   8. Myopia of both eyes  H52.13 Ambulatory referral to Ophthalmology    Meds ordered this encounter  Medications  . metoprolol tartrate (LOPRESSOR) 25 MG tablet    Sig: Take 1 tablet (25 mg total) by mouth 2 (two) times daily.    Dispense:  180 tablet    Refill:  3  . DULoxetine (CYMBALTA) 30 MG capsule    Sig: Take one 30 mg tablet by mouth once a day for the first week. Then increase to two 30 mg tablets ( total 60mg ) by mouth once daily.    Dispense:  60 capsule    Refill:  3   The entirety of the information documented in the History of Present Illness, Review of Systems and Physical Exam  were personally obtained by me. Portions of this information were initially documented by the [facility] and  reviewed by me for thoroughness and accuracy.   Brianna Reddish, MD   Return precautions advised.  Brianna Reddish, MD

## 2020-03-21 NOTE — Patient Instructions (Addendum)
We will call you within two weeks about your referral to Dr. Maryjean Ka with [facility]- Dr. Paulla Fore of sports medicine had previously recommended a consult. If you do not hear within 3 weeks, give [place] a call.   Also did referral to eye doctor   If shoulder pain persists 3 months out from last injection- let us know and we can do new referral to sports medicine  blood pressure ok today but sinc eyou have had some #s into 150s lets restart metoprolol at lower dose 25 mg twice daily. I am going to hold off on amlodipine for now given prior swelling issues but we may restart it at a lower dose at a later date. Please monitor blood pressure at home and if over 140/90 on average then please let us know  Start cymbalta. If any thoughts of self harm let us know. If you feel too sedated with this do not increase to the 60mg  dose and let us know.   Please call [phone] to schedule a visit with [place] behavioral [facility] -Trey Paula is an excellent counselor who is based out of our clinic

## 2020-03-21 NOTE — Assessment & Plan Note (Signed)
S: medication: Patient had previously been on amlodipine 10 mg and metoprolol 50 mg twice daily.  She also had been on lisinopril previously but is now off Home readings #s: Peak she has seen has been in the 150s or 160s at other offices BP Readings from Last 3 Encounters:  03/21/20 128/80  03/14/20 120/73  02/21/20 (!) 164/82  A/P: From AVS: "Blood pressure ok today but sinc eyou have had some #s into 150s lets restart metoprolol at lower dose 25 mg twice daily. I am going to hold off on amlodipine for now given prior swelling issues but we may restart it at a lower dose at a later date. Please monitor blood pressure at home and if over 140/90 on average then please let us know"

## 2020-03-21 NOTE — Assessment & Plan Note (Addendum)
S: Medication: compliant with wellbutrin 300mg . Had bene on celexa 40mg  last year but looks like she ran out of this and has not been refilled since likely December.  Depression screen [facility] 2/9 03/21/2020 03/16/2019 08/27/2018  Decreased Interest 1 0 1  Down, Depressed, Hopeless 2 0 3  PHQ - 2 Score 3 0 4  Altered sleeping 3 0 0  Tired, decreased energy 3 3 2   Change in appetite 3 3 3   Feeling bad or failure about yourself  1 0 1  Trouble concentrating 3 0 0  Moving slowly or fidgety/restless 3 0 0  Suicidal thoughts 0 0 0  PHQ-9 Score 19 6 10   Difficult doing work/chores Not difficult at all Not difficult at all -   A/P: Depression is poorly controlled with PHQ-9 of 19 today despite wellbutrin 300mg .  It looks like she has not been able to take the Celexa recently-due to chronic pain we opted to try Cymbalta instead.  I recommended a follow-up in 6 weeks to check in.  If she develops any thoughts of self-harm she will let us know immediately -I also gave her information for counseling with about behavioral health

## 2020-03-21 NOTE — Assessment & Plan Note (Signed)
Incidental finding on CT of chest.  Will need to work on modifying risk factors.  We will check lipid panel at follow-up.  Consider statin.  Would not use aspirin on Xarelto

## 2020-03-21 NOTE — Assessment & Plan Note (Signed)
#   DVT/PE-at time of diagnosis of lung cancer S: Pulmonary embolism noted on CT of chest 08/31/2019.Patient diagnosed with right-sided DVT September 01, 2019.  Involving right posterior tibial veins.  She also had left popliteal vein DVT.    Currently she is compliant with xarelto 20 mg ongoing while cancer is active-I would be concerned about stopping and recurrence without A/P: Chronic DVT/PE-ongoing need for anticoagulation given ongoing cancer-we will continue Xarelto at this time

## 2020-03-21 NOTE — Assessment & Plan Note (Signed)
#  Cervical spinal stenosis/chronic pain S: Patient with long-term history of chronic pain related to cervical spinal stenosis-see prior MRI reports.  I have not seen patient recently as she has been dealing with lung cancer-fortunately she feels well enough today to discuss her more chronic issues  Patient states she has pain  in both sides of low back and radiates into the left leg mostly. Also feels cold to touch on that side. No recent changes in pain but just ongoing pain.   Has been on gabapentin 1800mg  total per day for several years- daughter and patient wondering about other treatment options/meds due to ongoing pain. Pain lung cancer is improving pain levels in that area.   A/P: Patient previously has been seen by Dr. Paulla Fore of sports medicine.  He had recommended considering referral to [place] [facility] with Dr. Maryjean Ka.  He wanted to explore the idea of spinal cord stimulator potentially. -With chronic pain we opted to switch from prior citalopram which she had stopped on her own it appears during cancer treatments to Cymbalta to see if that helps with her pain

## 2020-04-05 NOTE — Progress Notes (Incomplete)
  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 She did report some moderate to severe fatigue. She also reported improvement of left hip pain with radiation therapy as well as having a good appetite.  Plan: The patient will follow-up with radiation oncology in one month.  ________________________________________________   Blair Promise, PhD, MD  This document serves as a record of services personally performed by Gery Pray, MD. It was created on his behalf by Clerance Lav, a trained medical scribe. The creation of this record is based on the scribe's personal observations and the provider's statements to them. This document has been checked and approved by the attending provider.

## 2020-04-06 ENCOUNTER — Ambulatory Visit
Admission: RE | Admit: 2020-04-06 | Discharge: 2020-04-06 | Disposition: A | Discharge status: Home / Self Care | Payer: Medicare Other | Source: Ambulatory Visit | Attending: Radiation Oncology | Admitting: Radiation Oncology

## 2020-04-06 DIAGNOSIS — C7951 Secondary malignant neoplasm of bone: Secondary | ICD-10-CM

## 2020-04-06 NOTE — Progress Notes (Signed)
Radiation Oncology         [phone] ________________________________  Name: Brianna Saunders MRN: [id number]  Date: 04/06/2020  DOB: 01-20-1956  Follow-Up Visit Note  CC: Marin Olp, MD  [name], MD    ICD-10-CM   1. 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  Radiation Treatment Dates: 02/23/2020 through 03/07/2020 Site Technique Total Dose (Gy) Dose per Fx (Gy) Completed Fx Beam Energies  Femur Left: Ext_Lt Complex 30/30 3 10/10 15X    Narrative:  The patient was contacted by phone to check on her after her palliative radiation therapy directed at the left femoral head and neck area. the patient was seen by Dr. Maylon Peppers on 03/14/2020. She continues on [place]. The patient continued to have some non-specific symptoms including dyspnea with exertion, low back pain, and leg weakness. Thus, Dr. Maylon Peppers ordered CT scans of the chest, abdomen, pelvis, and lumbar spine.  CT of lumbar spine on 03/20/2020 showed unchanged L3 metastasis with unchanged lumbar disc and facet degeneration with mild-to-moderate spinal and neural foraminal stenosis. There was no evidence of metastases or acute osseous abnormalities in the lumbar spine.  CT of chest/abdomen/pelvis on 03/20/2020 showed no significant interval change in the appearance of the chest since 01/25/2020. The primary right upper lobe cavitary lung lesion was stable to slightly decreased in size in the interval. The right hilar adenopathy was slightly increased in size in the interval. The sub-solid nodule within the anterior right upper lobe had decreased in the interval. The right lower lobe index pulmonary nodules were able to slightly decreased in size. The sclerotic metastases involving the right ilium, left proximal femur, and  L3 vertebral body were unchanged.  On review of systems, she reports doing well.  No bothersome issues. She denies pain in the left hip at this time.  ALLERGIES:  has No Known Allergies.  Meds: Current Outpatient Medications  Medication Sig Dispense Refill  . albuterol (VENTOLIN HFA) 108 (90 Base) MCG/ACT inhaler INHALE 2 PUFFS BY MOUTH EVERY 6 HOURS AS NEEDED FOR WHEEZING AND SHORTNESS OF BREATH 18 g 11  . baclofen (LIORESAL) 10 MG tablet TAKE 1 TABLET BY MOUTH THREE TIMES A DAY AS NEEDED FOR MUSCLE SPASMS (Patient taking differently: Take 10 mg by mouth 3 (three) times daily. ) 270 tablet 1  . benzonatate (TESSALON) 100 MG capsule TAKE 1 CAPSULE BY MOUTH THREE TIMES A DAY AS NEEDED FOR COUGH 60 capsule 2  . buPROPion (WELLBUTRIN XL) 150 MG 24 hr tablet TAKE 2 TABLETS (300 MG TOTAL) BY MOUTH DAILY. 180 tablet 2  . DULoxetine (CYMBALTA) 30 MG capsule Take one 30 mg tablet by mouth once a day for the first week. Then increase to two 30 mg tablets ( total 44m) by mouth once daily. 60 capsule 3  . gabapentin (NEURONTIN) 300 MG capsule TAKE 2 CAPSULES (600 MG TOTAL) BY MOUTH 3 (THREE) TIMES DAILY. 180 capsule 1  . guaiFENesin-dextromethorphan (ROBITUSSIN DM) 100-10 MG/5ML syrup Take 5 mLs by mouth every 4 (four) hours as needed for cough. 120 mL 1  . HYDROcodone-acetaminophen (NORCO) 7.5-325 MG tablet Take 1 tablet by mouth every 8 (eight) hours as needed for moderate pain. 60 tablet 0  . metoprolol tartrate (LOPRESSOR) 25 MG tablet Take 1 tablet (25 mg total)  by mouth 2 (two) times daily. 180 tablet 3  . ondansetron (ZOFRAN) 8 MG tablet Take 1 tablet (8 mg total) by mouth every 8 (eight) hours as needed for nausea or vomiting. 40 tablet 2  . osimertinib mesylate (TAGRISSO) 80 MG tablet Take 80 mg by mouth every morning.    . predniSONE (DELTASONE) 5 MG tablet TAKE 1 TABLET BY MOUTH EVERY DAY WITH BREAKFAST 30 tablet 3  . prochlorperazine (COMPAZINE) 10 MG tablet Take 1 tablet (10 mg total) by  mouth every 8 (eight) hours as needed for nausea or vomiting. 40 tablet 2  . XARELTO 20 MG TABS tablet TAKE 1 TABLET BY MOUTH DAILY WITH SUPPER START AFTER STARTER PACK 90 tablet 1   No current facility-administered medications for this encounter.    Physical Findings: Patient not examined  Lab Findings: Lab Results  Component Value Date   WBC 6.3 03/14/2020   HGB 10.1 (L) 03/14/2020   HCT 31.5 (L) 03/14/2020   MCV 87.7 03/14/2020   PLT 261 03/14/2020    Radiographic Findings: CT CHEST W CONTRAST  Result Date: 03/21/2020 CLINICAL DATA:  Non-small cell lung cancer, restaging. EXAM: CT CHEST, ABDOMEN, AND PELVIS WITH CONTRAST TECHNIQUE: Multidetector CT imaging of the chest, abdomen and pelvis was performed following the standard protocol during bolus administration of intravenous contrast. CONTRAST:  190m OMNIPAQUE IOHEXOL 300 MG/ML  SOLN COMPARISON:  01/25/2020 FINDINGS: CT CHEST FINDINGS Cardiovascular: The heart size is normal. Aortic atherosclerosis identified. Three vessel coronary artery calcifications. Mediastinum/Nodes: Normal appearance of the thyroid gland. The trachea appears patent and is midline. Normal appearance of the esophagus. The right hilar lung mass measures 3.8 x 2.3 cm, image 29/2. Previously 3.4 by 1.8 cm. No other enlarged mediastinal or hilar lymph nodes identified. Lungs/Pleura: No pleural effusion. Thick walled cavitary lesion in the right upper lobe is again noted. This measures 3.2 x 1.9 cm, image 67/6. Previously 3.5 x 2.3 cm. Index sub solid anterior right upper lobe lung nodule measures 0.6 x 0.5 cm, image 47/6. Previously 1.2 x 0.8 cm. Index nodule within the posterior right lower lobe measures 1.3 x 0.5 cm, image 90/6. Previously this measured 1.7 by 0.9 cm. Adjacent nodule measures 0.8 x 0.6 cm, image 90/6. Previously 1.5 x 1.0 cm. Left lung appears clear. Chronic pleuroparenchymal scarring within the posterior and lateral right upper lobe appears unchanged.  Similar appearance of architectural distortion and scarring within the right middle lobe. Musculoskeletal: Orthopedic fixation hardware in the lower cervical and upper thoracic spine. No suspicious bone lesions identified. Unchanged inferior endplate deformity involving the T12 vertebra. CT ABDOMEN PELVIS FINDINGS Hepatobiliary: Numerous cysts are again identified scattered throughout the liver. These appear unchanged from the previous exam. The largest of these is in the medial segment of left lobe measuring 2.6 cm, image 62/2. The gallbladder is unremarkable. No biliary ductal dilatation. Pancreas: Unremarkable. No pancreatic ductal dilatation or surrounding inflammatory changes. Spleen: Normal in size without focal abnormality. Adrenals/Urinary Tract: Normal appearance of the adrenal glands. The left kidney is normal. There is atrophy of the right kidney. Right sided hydronephrosis and hydroureter noted to the none S1 level. There appears to be a tiny calcification at the transition to decreased caliber distal ureter. This measures approximately 2 mm, image 96/2. Urinary bladder appears normal. Stomach/Bowel: Stomach is within normal limits. Appendix appears normal. No evidence of bowel wall thickening, distention, or inflammatory changes. Vascular/Lymphatic: Aortic atherosclerosis. No aneurysm. No abdominopelvic adenopathy. Reproductive: Status post hysterectomy. No adnexal masses. Other: No  ascites or focal fluid collections. Musculoskeletal: Sclerotic metastases are again identified involving the right ilium left proximal femur, and L3 vertebral body. Similar. IMPRESSION: 1. Overall, there is been no significant interval change in the appearance of the chest compared with 01/25/2020. The primary right upper lobe cavitary lung lesion is stable to slightly decreased in size in the interval. Right hilar adenopathy is slightly increased in size in the interval. Sub solid nodule within the anterior right upper lobe  is decreased in the interval. Stable to slight decrease in size of right lower lobe index pulmonary nodules. 2. Sclerotic metastases involving the right ilium, left proximal femur, and L3 vertebral body are unchanged. 3. Right-sided hydronephrosis and hydroureter to the level of the S1 level. There appears to be a tiny calcification at the transition to decreased caliber distal ureter. 4. Three vessel coronary artery calcifications noted. Aortic Atherosclerosis (ICD10-I70.0). Electronically Signed   By: Kerby Moors M.D.   On: 03/21/2020 12:50   CT ABDOMEN PELVIS W CONTRAST  Result Date: 03/21/2020 CLINICAL DATA:  Non-small cell lung cancer, restaging. EXAM: CT CHEST, ABDOMEN, AND PELVIS WITH CONTRAST TECHNIQUE: Multidetector CT imaging of the chest, abdomen and pelvis was performed following the standard protocol during bolus administration of intravenous contrast. CONTRAST:  139m OMNIPAQUE IOHEXOL 300 MG/ML  SOLN COMPARISON:  01/25/2020 FINDINGS: CT CHEST FINDINGS Cardiovascular: The heart size is normal. Aortic atherosclerosis identified. Three vessel coronary artery calcifications. Mediastinum/Nodes: Normal appearance of the thyroid gland. The trachea appears patent and is midline. Normal appearance of the esophagus. The right hilar lung mass measures 3.8 x 2.3 cm, image 29/2. Previously 3.4 by 1.8 cm. No other enlarged mediastinal or hilar lymph nodes identified. Lungs/Pleura: No pleural effusion. Thick walled cavitary lesion in the right upper lobe is again noted. This measures 3.2 x 1.9 cm, image 67/6. Previously 3.5 x 2.3 cm. Index sub solid anterior right upper lobe lung nodule measures 0.6 x 0.5 cm, image 47/6. Previously 1.2 x 0.8 cm. Index nodule within the posterior right lower lobe measures 1.3 x 0.5 cm, image 90/6. Previously this measured 1.7 by 0.9 cm. Adjacent nodule measures 0.8 x 0.6 cm, image 90/6. Previously 1.5 x 1.0 cm. Left lung appears clear. Chronic pleuroparenchymal scarring within  the posterior and lateral right upper lobe appears unchanged. Similar appearance of architectural distortion and scarring within the right middle lobe. Musculoskeletal: Orthopedic fixation hardware in the lower cervical and upper thoracic spine. No suspicious bone lesions identified. Unchanged inferior endplate deformity involving the T12 vertebra. CT ABDOMEN PELVIS FINDINGS Hepatobiliary: Numerous cysts are again identified scattered throughout the liver. These appear unchanged from the previous exam. The largest of these is in the medial segment of left lobe measuring 2.6 cm, image 62/2. The gallbladder is unremarkable. No biliary ductal dilatation. Pancreas: Unremarkable. No pancreatic ductal dilatation or surrounding inflammatory changes. Spleen: Normal in size without focal abnormality. Adrenals/Urinary Tract: Normal appearance of the adrenal glands. The left kidney is normal. There is atrophy of the right kidney. Right sided hydronephrosis and hydroureter noted to the none S1 level. There appears to be a tiny calcification at the transition to decreased caliber distal ureter. This measures approximately 2 mm, image 96/2. Urinary bladder appears normal. Stomach/Bowel: Stomach is within normal limits. Appendix appears normal. No evidence of bowel wall thickening, distention, or inflammatory changes. Vascular/Lymphatic: Aortic atherosclerosis. No aneurysm. No abdominopelvic adenopathy. Reproductive: Status post hysterectomy. No adnexal masses. Other: No ascites or focal fluid collections. Musculoskeletal: Sclerotic metastases are again identified involving the  right ilium left proximal femur, and L3 vertebral body. Similar. IMPRESSION: 1. Overall, there is been no significant interval change in the appearance of the chest compared with 01/25/2020. The primary right upper lobe cavitary lung lesion is stable to slightly decreased in size in the interval. Right hilar adenopathy is slightly increased in size in the  interval. Sub solid nodule within the anterior right upper lobe is decreased in the interval. Stable to slight decrease in size of right lower lobe index pulmonary nodules. 2. Sclerotic metastases involving the right ilium, left proximal femur, and L3 vertebral body are unchanged. 3. Right-sided hydronephrosis and hydroureter to the level of the S1 level. There appears to be a tiny calcification at the transition to decreased caliber distal ureter. 4. Three vessel coronary artery calcifications noted. Aortic Atherosclerosis (ICD10-I70.0). Electronically Signed   By: Kerby Moors M.D.   On: 03/21/2020 12:50   CT L-SPINE NO CHARGE  Result Date: 03/20/2020 CLINICAL DATA:  Metastatic lung cancer. EXAM: CT LUMBAR SPINE WITHOUT CONTRAST TECHNIQUE: Multidetector CT imaging of the lumbar spine was performed without intravenous contrast administration. Multiplanar CT image reconstructions were also generated. COMPARISON:  CT chest, abdomen, and pelvis 01/25/2020. Lumbar spine MRI 11/17/2018. FINDINGS: Segmentation: The lowest fully formed intervertebral disc space is designated L5-S1. There are no ribs at T12. Alignment: Normal. Vertebrae: Sclerotic lesion in the L3 vertebral body, unchanged from the 01/25/2020 CT. Chronic sclerotic lesion in the posterior right ilium, incompletely covered on this spine CT. No new lumbar spine lesion or fracture identified. Paraspinal and other soft tissues: No acute paraspinal soft tissue abnormality. Intra-abdominal and pelvic contents reported separately. Disc levels: L1-2: Negative. L2-3: Disc bulging and mild facet and ligamentum flavum hypertrophy result in mild spinal stenosis and mild bilateral neural foraminal stenosis, similar to the prior MRI. L3-4: Disc bulging and moderate facet hypertrophy result in mild-to-moderate spinal stenosis and mild-to-moderate bilateral neural foraminal stenosis, similar to the prior MRI. L4-5: Mild-to-moderate disc space narrowing and vacuum  disc. Previous posterior decompression. Disc bulging and moderate facet hypertrophy result in moderate right and mild-to-moderate left neural foraminal stenosis without significant spinal stenosis, similar to the prior MRI. L5-S1: Mild disc bulging and mild right and moderate left facet hypertrophy without significant stenosis, similar to the prior MRI. IMPRESSION: 1. Unchanged L3 metastasis. 2. No evidence of new metastases or acute osseous abnormality in the lumbar spine. 3. Unchanged lumbar disc and facet degeneration with mild-to-moderate spinal and neural foraminal stenosis as above. Electronically Signed   By: Logan Bores M.D.   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  She experienced good improvement in her pain in the left pelvis area.  Plan: The patient is scheduled to see Dr. Marin Olp on 04/12/2020. Follow-up with radiation oncology on an as-needed basis.  ____________________________________   Blair Promise, PhD, MD  This document serves as a record of services personally performed by Gery Pray, MD. It was created on his behalf by Clerance Lav, a trained medical scribe. The creation of this record is based on the scribe's personal observations and the provider's statements to them. This document has been checked and approved by the attending provider.

## 2020-04-10 ENCOUNTER — Other Ambulatory Visit: Payer: Self-pay | Admitting: Hematology

## 2020-04-10 DIAGNOSIS — C3491 Malignant neoplasm of unspecified part of right bronchus or lung: Secondary | ICD-10-CM

## 2020-04-11 MED ORDER — HYDROCODONE-ACETAMINOPHEN 7.5-325 MG PO TABS: 1.0000 | ORAL_TABLET | Freq: Three times a day (TID) | ORAL | 0 refills | Status: DC | PRN

## 2020-04-12 ENCOUNTER — Inpatient Hospital Stay (HOSPITAL_BASED_OUTPATIENT_CLINIC_OR_DEPARTMENT_OTHER): Payer: Medicare Other | Admitting: Hematology & Oncology

## 2020-04-12 ENCOUNTER — Inpatient Hospital Stay: Payer: Medicare Other

## 2020-04-12 ENCOUNTER — Encounter: Payer: Self-pay | Admitting: *Deleted

## 2020-04-12 ENCOUNTER — Inpatient Hospital Stay: Payer: Medicare Other | Attending: Hematology

## 2020-04-12 ENCOUNTER — Encounter: Payer: Self-pay | Admitting: Hematology & Oncology

## 2020-04-12 ENCOUNTER — Other Ambulatory Visit: Payer: Self-pay

## 2020-04-12 VITALS — BP 118/72 | HR 78 | Temp 97.7°F | Resp 16 | Wt 157.0 lb

## 2020-04-12 DIAGNOSIS — C7951 Secondary malignant neoplasm of bone: Secondary | ICD-10-CM | POA: Insufficient documentation

## 2020-04-12 DIAGNOSIS — I82409 Acute embolism and thrombosis of unspecified deep veins of unspecified lower extremity: Secondary | ICD-10-CM

## 2020-04-12 DIAGNOSIS — C3491 Malignant neoplasm of unspecified part of right bronchus or lung: Secondary | ICD-10-CM | POA: Diagnosis not present

## 2020-04-12 DIAGNOSIS — N183 Chronic kidney disease, stage 3 unspecified: Secondary | ICD-10-CM | POA: Diagnosis not present

## 2020-04-12 DIAGNOSIS — D649 Anemia, unspecified: Secondary | ICD-10-CM

## 2020-04-12 DIAGNOSIS — D631 Anemia in chronic kidney disease: Secondary | ICD-10-CM | POA: Diagnosis not present

## 2020-04-12 LAB — CMP (CANCER CENTER ONLY)
ALT: 22 U/L (ref 0–44)
AST: 15 U/L (ref 15–41)
Albumin: 4.1 g/dL (ref 3.5–5.0)
Alkaline Phosphatase: 61 U/L (ref 38–126)
Anion gap: 6 (ref 5–15)
BUN: 19 mg/dL (ref 8–23)
CO2: 26 mmol/L (ref 22–32)
Calcium: 9 mg/dL (ref 8.9–10.3)
Chloride: 105 mmol/L (ref 98–111)
Creatinine: 1.21 mg/dL — ABNORMAL HIGH (ref 0.44–1.00)
GFR, Est AFR Am: 55 mL/min — ABNORMAL LOW (ref >60)
GFR, Estimated: 47 mL/min — ABNORMAL LOW (ref >60)
Glucose, Bld: 104 mg/dL — ABNORMAL HIGH (ref 70–99)
Potassium: 4.8 mmol/L (ref 3.5–5.1)
Sodium: 137 mmol/L (ref 135–145)
Total Bilirubin: 0.3 mg/dL (ref 0.3–1.2)
Total Protein: 7.7 g/dL (ref 6.5–8.1)

## 2020-04-12 LAB — CBC WITH DIFFERENTIAL (CANCER CENTER ONLY)
Abs Immature Granulocytes: 0.03 10*3/uL (ref 0.00–0.07)
Basophils Absolute: 0 10*3/uL (ref 0.0–0.1)
Basophils Relative: 0 %
Eosinophils Absolute: 0.1 10*3/uL (ref 0.0–0.5)
Eosinophils Relative: 1 %
HCT: 33.5 % — ABNORMAL LOW (ref 36.0–46.0)
Hemoglobin: 10.4 g/dL — ABNORMAL LOW (ref 12.0–15.0)
Immature Granulocytes: 1 %
Lymphocytes Relative: 12 %
Lymphs Abs: 0.8 10*3/uL (ref 0.7–4.0)
MCH: 29.9 pg (ref 26.0–34.0)
MCHC: 31 g/dL (ref 30.0–36.0)
MCV: 96.3 fL (ref 80.0–100.0)
Monocytes Absolute: 0.4 10*3/uL (ref 0.1–1.0)
Monocytes Relative: 6 %
Neutro Abs: 5 10*3/uL (ref 1.7–7.7)
Neutrophils Relative %: 80 %
Platelet Count: 229 10*3/uL (ref 150–400)
RBC: 3.48 MIL/uL — ABNORMAL LOW (ref 3.87–5.11)
RDW: 18.1 % — ABNORMAL HIGH (ref 11.5–15.5)
WBC Count: 6.3 10*3/uL (ref 4.0–10.5)
nRBC: 0 % (ref 0.0–0.2)

## 2020-04-12 MED ORDER — DENOSUMAB 120 MG/1.7ML ~~LOC~~ SOLN
120.0000 mg | Freq: Once | SUBCUTANEOUS | Status: AC
  Administered 2020-04-12: 120 mg via SUBCUTANEOUS

## 2020-04-12 MED ORDER — DARBEPOETIN ALFA 300 MCG/0.6ML IJ SOSY
PREFILLED_SYRINGE | INTRAMUSCULAR | Status: AC
  Filled 2020-04-12: qty 0.6

## 2020-04-12 MED ORDER — DENOSUMAB 120 MG/1.7ML ~~LOC~~ SOLN
SUBCUTANEOUS | Status: AC
  Filled 2020-04-12: qty 1.7

## 2020-04-12 MED ORDER — DRONABINOL 2.5 MG PO CAPS
2.5000 mg | ORAL_CAPSULE | Freq: Two times a day (BID) | ORAL | 0 refills | Status: DC
Start: 2020-04-12 — End: 2020-05-17

## 2020-04-12 MED ORDER — DARBEPOETIN ALFA 300 MCG/0.6ML IJ SOSY
300.0000 ug | PREFILLED_SYRINGE | Freq: Once | INTRAMUSCULAR | Status: AC
  Administered 2020-04-12: 300 ug via SUBCUTANEOUS

## 2020-04-12 NOTE — Progress Notes (Signed)
Hematology and Oncology Follow Up Visit  Brianna Saunders [id number] 1956-05-14 64 y.o. 04/12/2020   Principle Diagnosis:  Stage IV SCCa of the RUL -- bone and nodal mets -- EGFR (+) - del exon 19 Pulmonary Embolism/ bilateral LE DVT   Current Therapy:    Tagrisso 80 mg po q day -- started on 09/2019  Xgeva 80 mg sq q month   Xarelto 20 mg po q day - started on 08/2019     Interim History:  Brianna Saunders is back for follow-up.  She was followed by Dr. Maylon Peppers.  She comes in with her daughter.  She is very nice.  She has metastatic squamous cell carcinoma of the right lung.  Thankfully, she has the EGFR mutation.  She is on Tagrisso for this.  She had CT scans done a week or so ago.  The CT scans seem to show that she was responding.  The mass in the right upper lung now measures 3.2 x 1.9 cm.  She has several other nodules in the right lung which are also somewhat smaller.  There is a right hilar mass which is slightly larger.  Measures 3.8 x 2.3 cm.  She does have some sclerotic bone metastasis.  There is no problems with the liver.  There is no abdominal/retroperitoneal adenopathy.  She does have some pain issues.  She is on hydrocodone for this.  She takes about 3 a day.  She has not had any problems with the Tagrisso.  There is been no diarrhea.  She is lost about 7 pounds since she was last here.  She seems to be eating okay.  I will try some Marinol on her.  We will try 2.5 mg p.o. twice daily to see if this might help.  She has had no problems with headaches.  She has had no rashes.  There is been no leg swelling.  She is on the Xarelto.  She has not had any follow-up scans of her legs.  I think it would be interesting to see how the thromboembolic disease is doing.  Currently, I would say her performance status is ECOG 1-2.  Medications:  Current Outpatient Medications:  .  albuterol (VENTOLIN HFA) 108 (90 Base) MCG/ACT inhaler, INHALE 2 PUFFS BY MOUTH EVERY 6 HOURS AS  NEEDED FOR WHEEZING AND SHORTNESS OF BREATH, Disp: 18 g, Rfl: 11 .  baclofen (LIORESAL) 10 MG tablet, TAKE 1 TABLET BY MOUTH THREE TIMES A DAY AS NEEDED FOR MUSCLE SPASMS (Patient taking differently: Take 10 mg by mouth 3 (three) times daily. ), Disp: 270 tablet, Rfl: 1 .  benzonatate (TESSALON) 100 MG capsule, TAKE 1 CAPSULE BY MOUTH THREE TIMES A DAY AS NEEDED FOR COUGH, Disp: 60 capsule, Rfl: 2 .  buPROPion (WELLBUTRIN XL) 150 MG 24 hr tablet, TAKE 2 TABLETS (300 MG TOTAL) BY MOUTH DAILY., Disp: 180 tablet, Rfl: 2 .  dronabinol (MARINOL) 2.5 MG capsule, Take 1 capsule (2.5 mg total) by mouth 2 (two) times daily before lunch and supper., Disp: 60 capsule, Rfl: 0 .  DULoxetine (CYMBALTA) 30 MG capsule, Take one 30 mg tablet by mouth once a day for the first week. Then increase to two 30 mg tablets ( total 38m) by mouth once daily., Disp: 60 capsule, Rfl: 3 .  gabapentin (NEURONTIN) 300 MG capsule, TAKE 2 CAPSULES (600 MG TOTAL) BY MOUTH 3 (THREE) TIMES DAILY., Disp: 180 capsule, Rfl: 1 .  guaiFENesin-dextromethorphan (ROBITUSSIN DM) 100-10 MG/5ML syrup, Take 5 mLs by  mouth every 4 (four) hours as needed for cough., Disp: 120 mL, Rfl: 1 .  HYDROcodone-acetaminophen (NORCO) 7.5-325 MG tablet, Take 1 tablet by mouth every 8 (eight) hours as needed for moderate pain., Disp: 60 tablet, Rfl: 0 .  metoprolol tartrate (LOPRESSOR) 25 MG tablet, Take 1 tablet (25 mg total) by mouth 2 (two) times daily., Disp: 180 tablet, Rfl: 3 .  ondansetron (ZOFRAN) 8 MG tablet, Take 1 tablet (8 mg total) by mouth every 8 (eight) hours as needed for nausea or vomiting., Disp: 40 tablet, Rfl: 2 .  osimertinib mesylate (TAGRISSO) 80 MG tablet, Take 80 mg by mouth every morning., Disp: , Rfl:  .  predniSONE (DELTASONE) 5 MG tablet, TAKE 1 TABLET BY MOUTH EVERY DAY WITH BREAKFAST, Disp: 30 tablet, Rfl: 3 .  prochlorperazine (COMPAZINE) 10 MG tablet, Take 1 tablet (10 mg total) by mouth every 8 (eight) hours as needed for nausea  or vomiting., Disp: 40 tablet, Rfl: 2 .  XARELTO 20 MG TABS tablet, TAKE 1 TABLET BY MOUTH DAILY WITH SUPPER START AFTER STARTER PACK, Disp: 90 tablet, Rfl: 1  Allergies: No Known Allergies  Past Medical History, Surgical history, Social history, and Family History were reviewed and updated.  Review of Systems: Review of Systems  Constitutional: Negative.   HENT:  Negative.   Eyes: Negative.   Respiratory: Negative.   Cardiovascular: Negative.   Gastrointestinal: Negative.   Endocrine: Negative.   Genitourinary: Negative.    Musculoskeletal: Positive for arthralgias and back pain.  Skin: Negative.   Neurological: Negative.   Hematological: Negative.   Psychiatric/Behavioral: Negative.     Physical Exam:  weight is 157 lb (71.2 kg). Her temporal temperature is 97.7 F (36.5 C). Her blood pressure is 118/72 and her pulse is 78. Her respiration is 16 and oxygen saturation is 98%.   Wt Readings from Last 3 Encounters:  04/12/20 157 lb (71.2 kg)  03/21/20 160 lb (72.6 kg)  03/14/20 164 lb (74.4 kg)    Physical Exam Vitals reviewed.  HENT:     Head: Normocephalic and atraumatic.  Eyes:     Pupils: Pupils are equal, round, and reactive to light.  Cardiovascular:     Rate and Rhythm: Normal rate and regular rhythm.     Heart sounds: Normal heart sounds.  Pulmonary:     Effort: Pulmonary effort is normal.     Breath sounds: Normal breath sounds.  Abdominal:     General: Bowel sounds are normal.     Palpations: Abdomen is soft.  Musculoskeletal:        General: No tenderness or deformity. Normal range of motion.     Cervical back: Normal range of motion.  Lymphadenopathy:     Cervical: No cervical adenopathy.  Skin:    General: Skin is warm and dry.     Findings: No erythema or rash.  Neurological:     Mental Status: She is alert and oriented to person, place, and time.  Psychiatric:        Behavior: Behavior normal.        Thought Content: Thought content normal.         Judgment: Judgment normal.      Lab Results  Component Value Date   WBC 6.3 04/12/2020   HGB 10.4 (L) 04/12/2020   HCT 33.5 (L) 04/12/2020   MCV 96.3 04/12/2020   PLT 229 04/12/2020     Chemistry      Component Value Date/Time   NA 137  04/12/2020 1404   K 4.8 04/12/2020 1404   CL 105 04/12/2020 1404   CO2 26 04/12/2020 1404   BUN 19 04/12/2020 1404   CREATININE 1.21 (H) 04/12/2020 1404      Component Value Date/Time   CALCIUM 9.0 04/12/2020 1404   ALKPHOS 61 04/12/2020 1404   AST 15 04/12/2020 1404   ALT 22 04/12/2020 1404   BILITOT 0.3 04/12/2020 1404      Impression and Plan: Ms. Buxbaum is a very nice 65 year old African-American female.  She has metastatic squamous cell carcinoma of the right lung.  She does have the exon 19 deletion for the EGFR gene.  As such, she is on Tagrisso.  Her disease overall is stable from my point of view.  I really would not even think that she is showing progressive disease.  I know that the right hilar mass might be slightly larger.  I suppose this could be reactive.  She gets her Delton See today.  I would like to get Dopplers of her legs to see how the thromboembolic disease is responding.  Since she just had scans done, I do not think we have to do any additional scans probably until August.  This clearly is a quality of life issue.  Maybe can we get her appetite little bit better with the Marinol.  I think that there have been some studies that have shown that there might be some utility with doing immunotherapy along with the targeted therapy.  We may have to look into this if we find that there might be some disease progression.  We will plan to get her back in 1 month.   Volanda Napoleon, MD 6/2/20214:54 PM

## 2020-04-12 NOTE — Patient Instructions (Signed)
Darbepoetin Alfa injection What is this medicine? DARBEPOETIN ALFA (dar be POE e tin AL fa) helps your body make more red blood cells. It is used to treat anemia caused by chronic kidney failure and chemotherapy. This medicine may be used for other purposes; ask your health care provider or pharmacist if you have questions. COMMON BRAND NAME(S): Aranesp What should I tell my health care provider before I take this medicine? They need to know if you have any of these conditions:  blood clotting disorders or history of blood clots  cancer patient not on chemotherapy  cystic fibrosis  heart disease, such as angina, heart failure, or a history of a heart attack  hemoglobin level of 12 g/dL or greater  high blood pressure  low levels of folate, iron, or vitamin B12  seizures  an unusual or allergic reaction to darbepoetin, erythropoietin, albumin, hamster proteins, latex, other medicines, foods, dyes, or preservatives  pregnant or trying to get pregnant  breast-feeding How should I use this medicine? This medicine is for injection into a vein or under the skin. It is usually given by a health care professional in a hospital or clinic setting. If you get this medicine at home, you will be taught how to prepare and give this medicine. Use exactly as directed. Take your medicine at regular intervals. Do not take your medicine more often than directed. It is important that you put your used needles and syringes in a special sharps container. Do not put them in a trash can. If you do not have a sharps container, call your pharmacist or healthcare provider to get one. A special MedGuide will be given to you by the pharmacist with each prescription and refill. Be sure to read this information carefully each time. Talk to your pediatrician regarding the use of this medicine in children. While this medicine may be used in children as young as 1 month of age for selected conditions, precautions do  apply. Overdosage: If you think you have taken too much of this medicine contact a poison control center or emergency room at once. NOTE: This medicine is only for you. Do not share this medicine with others. What if I miss a dose? If you miss a dose, take it as soon as you can. If it is almost time for your next dose, take only that dose. Do not take double or extra doses. What may interact with this medicine? Do not take this medicine with any of the following medications:  epoetin alfa This list may not describe all possible interactions. Give your health care provider a list of all the medicines, herbs, non-prescription drugs, or dietary supplements you use. Also tell them if you smoke, drink alcohol, or use illegal drugs. Some items may interact with your medicine. What should I watch for while using this medicine? Your condition will be monitored carefully while you are receiving this medicine. You may need blood work done while you are taking this medicine. This medicine may cause a decrease in vitamin B6. You should make sure that you get enough vitamin B6 while you are taking this medicine. Discuss the foods you eat and the vitamins you take with your health care professional. What side effects may I notice from receiving this medicine? Side effects that you should report to your doctor or health care professional as soon as possible:  allergic reactions like skin rash, itching or hives, swelling of the face, lips, or tongue  breathing problems  changes in   vision  chest pain  confusion, trouble speaking or understanding  feeling faint or lightheaded, falls  high blood pressure  muscle aches or pains  pain, swelling, warmth in the leg  rapid weight gain  severe headaches  sudden numbness or weakness of the face, arm or leg  trouble walking, dizziness, loss of balance or coordination  seizures (convulsions)  swelling of the ankles, feet, hands  unusually weak or  tired Side effects that usually do not require medical attention (report to your doctor or health care professional if they continue or are bothersome):  diarrhea  fever, chills (flu-like symptoms)  headaches  nausea, vomiting  redness, stinging, or swelling at site where injected This list may not describe all possible side effects. Call your doctor for medical advice about side effects. You may report side effects to FDA at 1-800-FDA-1088. Where should I keep my medicine? Keep out of the reach of children. Store in a refrigerator between 2 and 8 degrees C (36 and 46 degrees F). Do not freeze. Do not shake. Throw away any unused portion if using a single-dose vial. Throw away any unused medicine after the expiration date. NOTE: This sheet is a summary. It may not cover all possible information. If you have questions about this medicine, talk to your doctor, pharmacist, or health care provider.  2020 Elsevier/Gold Standard (2017-11-12 16:44:20) Denosumab injection What is this medicine? DENOSUMAB (den oh sue mab) slows bone breakdown. Prolia is used to treat osteoporosis in women after menopause and in men, and in people who are taking corticosteroids for 6 months or more. Xgeva is used to treat a high calcium level due to cancer and to prevent bone fractures and other bone problems caused by multiple myeloma or cancer bone metastases. Xgeva is also used to treat giant cell tumor of the bone. This medicine may be used for other purposes; ask your health care provider or pharmacist if you have questions. COMMON BRAND NAME(S): Prolia, XGEVA What should I tell my health care provider before I take this medicine? They need to know if you have any of these conditions:  dental disease  having surgery or tooth extraction  infection  kidney disease  low levels of calcium or Vitamin D in the blood  malnutrition  on hemodialysis  skin conditions or sensitivity  thyroid or parathyroid  disease  an unusual reaction to denosumab, other medicines, foods, dyes, or preservatives  pregnant or trying to get pregnant  breast-feeding How should I use this medicine? This medicine is for injection under the skin. It is given by a health care professional in a hospital or clinic setting. A special MedGuide will be given to you before each treatment. Be sure to read this information carefully each time. For Prolia, talk to your pediatrician regarding the use of this medicine in children. Special care may be needed. For Xgeva, talk to your pediatrician regarding the use of this medicine in children. While this drug may be prescribed for children as young as 13 years for selected conditions, precautions do apply. Overdosage: If you think you have taken too much of this medicine contact a poison control center or emergency room at once. NOTE: This medicine is only for you. Do not share this medicine with others. What if I miss a dose? It is important not to miss your dose. Call your doctor or health care professional if you are unable to keep an appointment. What may interact with this medicine? Do not take this medicine   with any of the following medications:  other medicines containing denosumab This medicine may also interact with the following medications:  medicines that lower your chance of fighting infection  steroid medicines like prednisone or cortisone This list may not describe all possible interactions. Give your health care provider a list of all the medicines, herbs, non-prescription drugs, or dietary supplements you use. Also tell them if you smoke, drink alcohol, or use illegal drugs. Some items may interact with your medicine. What should I watch for while using this medicine? Visit your doctor or health care professional for regular checks on your progress. Your doctor or health care professional may order blood tests and other tests to see how you are doing. Call your  doctor or health care professional for advice if you get a fever, chills or sore throat, or other symptoms of a cold or flu. Do not treat yourself. This drug may decrease your body's ability to fight infection. Try to avoid being around people who are sick. You should make sure you get enough calcium and vitamin D while you are taking this medicine, unless your doctor tells you not to. Discuss the foods you eat and the vitamins you take with your health care professional. See your dentist regularly. Brush and floss your teeth as directed. Before you have any dental work done, tell your dentist you are receiving this medicine. Do not become pregnant while taking this medicine or for 5 months after stopping it. Talk with your doctor or health care professional about your birth control options while taking this medicine. Women should inform their doctor if they wish to become pregnant or think they might be pregnant. There is a potential for serious side effects to an unborn child. Talk to your health care professional or pharmacist for more information. What side effects may I notice from receiving this medicine? Side effects that you should report to your doctor or health care professional as soon as possible:  allergic reactions like skin rash, itching or hives, swelling of the face, lips, or tongue  bone pain  breathing problems  dizziness  jaw pain, especially after dental work  redness, blistering, peeling of the skin  signs and symptoms of infection like fever or chills; cough; sore throat; pain or trouble passing urine  signs of low calcium like fast heartbeat, muscle cramps or muscle pain; pain, tingling, numbness in the hands or feet; seizures  unusual bleeding or bruising  unusually weak or tired Side effects that usually do not require medical attention (report to your doctor or health care professional if they continue or are  bothersome):  constipation  diarrhea  headache  joint pain  loss of appetite  muscle pain  runny nose  tiredness  upset stomach This list may not describe all possible side effects. Call your doctor for medical advice about side effects. You may report side effects to FDA at 1-800-FDA-1088. Where should I keep my medicine? This medicine is only given in a clinic, doctor's office, or other health care setting and will not be stored at home. NOTE: This sheet is a summary. It may not cover all possible information. If you have questions about this medicine, talk to your doctor, pharmacist, or health care provider.  2020 Elsevier/Gold Standard (2018-03-06 16:10:44)  

## 2020-04-13 ENCOUNTER — Encounter: Payer: Self-pay | Admitting: Hematology & Oncology

## 2020-04-13 LAB — IRON AND TIBC
Iron: 30 ug/dL — ABNORMAL LOW (ref 41–142)
Saturation Ratios: 14 % — ABNORMAL LOW (ref 21–57)
TIBC: 209 ug/dL — ABNORMAL LOW (ref 236–444)
UIBC: 179 ug/dL (ref 120–384)

## 2020-04-13 LAB — SOLUBLE TRANSFERRIN RECEPTOR: Transferrin Receptor: 29.6 nmol/L — ABNORMAL HIGH (ref 12.2–27.3)

## 2020-04-13 LAB — FERRITIN: Ferritin: 193 ng/mL (ref 11–307)

## 2020-04-17 ENCOUNTER — Other Ambulatory Visit: Payer: Self-pay | Admitting: Family Medicine

## 2020-04-21 ENCOUNTER — Encounter: Payer: Self-pay | Admitting: Hematology & Oncology

## 2020-04-28 ENCOUNTER — Encounter: Payer: Self-pay | Admitting: Family Medicine

## 2020-05-03 ENCOUNTER — Ambulatory Visit: Payer: Medicare Other | Admitting: Gastroenterology

## 2020-05-08 ENCOUNTER — Encounter: Payer: Self-pay | Admitting: Hematology

## 2020-05-09 ENCOUNTER — Encounter: Payer: Self-pay | Admitting: Hematology

## 2020-05-09 ENCOUNTER — Other Ambulatory Visit: Payer: Self-pay | Admitting: Hematology & Oncology

## 2020-05-09 DIAGNOSIS — C3491 Malignant neoplasm of unspecified part of right bronchus or lung: Secondary | ICD-10-CM

## 2020-05-10 MED ORDER — HYDROCODONE-ACETAMINOPHEN 7.5-325 MG PO TABS: 1.0000 | ORAL_TABLET | Freq: Three times a day (TID) | ORAL | 0 refills | Status: DC | PRN

## 2020-05-11 ENCOUNTER — Inpatient Hospital Stay: Payer: Medicare Other

## 2020-05-11 ENCOUNTER — Inpatient Hospital Stay: Payer: Medicare Other | Admitting: Hematology & Oncology

## 2020-05-15 ENCOUNTER — Other Ambulatory Visit: Payer: Self-pay | Admitting: Hematology & Oncology

## 2020-05-17 ENCOUNTER — Other Ambulatory Visit: Payer: Self-pay | Admitting: *Deleted

## 2020-05-17 DIAGNOSIS — C3491 Malignant neoplasm of unspecified part of right bronchus or lung: Secondary | ICD-10-CM

## 2020-05-17 DIAGNOSIS — R630 Anorexia: Secondary | ICD-10-CM

## 2020-05-17 MED ORDER — DRONABINOL 2.5 MG PO CAPS: 2.5000 mg | ORAL_CAPSULE | Freq: Two times a day (BID) | ORAL | 0 refills | Status: DC

## 2020-05-23 DIAGNOSIS — C3491 Malignant neoplasm of unspecified part of right bronchus or lung: Secondary | ICD-10-CM | POA: Diagnosis not present

## 2020-05-23 DIAGNOSIS — C7951 Secondary malignant neoplasm of bone: Secondary | ICD-10-CM | POA: Diagnosis not present

## 2020-05-25 DIAGNOSIS — C3411 Malignant neoplasm of upper lobe, right bronchus or lung: Secondary | ICD-10-CM | POA: Diagnosis not present

## 2020-06-05 ENCOUNTER — Other Ambulatory Visit: Payer: Self-pay | Admitting: Hematology & Oncology

## 2020-06-05 DIAGNOSIS — R630 Anorexia: Secondary | ICD-10-CM

## 2020-06-05 DIAGNOSIS — C3491 Malignant neoplasm of unspecified part of right bronchus or lung: Secondary | ICD-10-CM

## 2020-06-06 MED ORDER — HYDROCODONE-ACETAMINOPHEN 7.5-325 MG PO TABS: 1.0000 | ORAL_TABLET | Freq: Three times a day (TID) | ORAL | 0 refills | Status: AC | PRN

## 2020-06-06 MED ORDER — DRONABINOL 2.5 MG PO CAPS: 2.5000 mg | ORAL_CAPSULE | Freq: Two times a day (BID) | ORAL | 0 refills | Status: AC

## 2020-06-07 DIAGNOSIS — G8929 Other chronic pain: Secondary | ICD-10-CM | POA: Diagnosis not present

## 2020-06-07 DIAGNOSIS — Z79899 Other long term (current) drug therapy: Secondary | ICD-10-CM | POA: Diagnosis not present

## 2020-06-07 DIAGNOSIS — I313 Pericardial effusion (noninflammatory): Secondary | ICD-10-CM | POA: Diagnosis not present

## 2020-06-07 DIAGNOSIS — I129 Hypertensive chronic kidney disease with stage 1 through stage 4 chronic kidney disease, or unspecified chronic kidney disease: Secondary | ICD-10-CM | POA: Diagnosis not present

## 2020-06-07 DIAGNOSIS — Z7901 Long term (current) use of anticoagulants: Secondary | ICD-10-CM | POA: Diagnosis not present

## 2020-06-07 DIAGNOSIS — I6782 Cerebral ischemia: Secondary | ICD-10-CM | POA: Diagnosis not present

## 2020-06-07 DIAGNOSIS — C7951 Secondary malignant neoplasm of bone: Secondary | ICD-10-CM | POA: Diagnosis not present

## 2020-06-07 DIAGNOSIS — Z86711 Personal history of pulmonary embolism: Secondary | ICD-10-CM | POA: Diagnosis not present

## 2020-06-07 DIAGNOSIS — M79652 Pain in left thigh: Secondary | ICD-10-CM | POA: Diagnosis not present

## 2020-06-07 DIAGNOSIS — R9089 Other abnormal findings on diagnostic imaging of central nervous system: Secondary | ICD-10-CM | POA: Diagnosis not present

## 2020-06-07 DIAGNOSIS — Z8673 Personal history of transient ischemic attack (TIA), and cerebral infarction without residual deficits: Secondary | ICD-10-CM | POA: Diagnosis not present

## 2020-06-07 DIAGNOSIS — C3411 Malignant neoplasm of upper lobe, right bronchus or lung: Secondary | ICD-10-CM | POA: Diagnosis not present

## 2020-06-07 DIAGNOSIS — M4800 Spinal stenosis, site unspecified: Secondary | ICD-10-CM | POA: Diagnosis not present

## 2020-06-07 DIAGNOSIS — D638 Anemia in other chronic diseases classified elsewhere: Secondary | ICD-10-CM | POA: Diagnosis not present

## 2020-06-07 DIAGNOSIS — F329 Major depressive disorder, single episode, unspecified: Secondary | ICD-10-CM | POA: Diagnosis not present

## 2020-06-07 DIAGNOSIS — N183 Chronic kidney disease, stage 3 unspecified: Secondary | ICD-10-CM | POA: Diagnosis not present

## 2020-06-07 DIAGNOSIS — Z86718 Personal history of other venous thrombosis and embolism: Secondary | ICD-10-CM | POA: Diagnosis not present

## 2020-06-07 DIAGNOSIS — M25552 Pain in left hip: Secondary | ICD-10-CM | POA: Diagnosis not present

## 2020-06-07 DIAGNOSIS — E559 Vitamin D deficiency, unspecified: Secondary | ICD-10-CM | POA: Diagnosis not present

## 2020-06-07 DIAGNOSIS — R0602 Shortness of breath: Secondary | ICD-10-CM | POA: Diagnosis not present

## 2020-06-07 DIAGNOSIS — G629 Polyneuropathy, unspecified: Secondary | ICD-10-CM | POA: Diagnosis not present

## 2020-06-07 DIAGNOSIS — I2510 Atherosclerotic heart disease of native coronary artery without angina pectoris: Secondary | ICD-10-CM | POA: Diagnosis not present

## 2020-06-07 DIAGNOSIS — M6281 Muscle weakness (generalized): Secondary | ICD-10-CM | POA: Diagnosis not present

## 2020-06-07 DIAGNOSIS — M542 Cervicalgia: Secondary | ICD-10-CM | POA: Diagnosis not present

## 2020-06-07 DIAGNOSIS — Z87891 Personal history of nicotine dependence: Secondary | ICD-10-CM | POA: Diagnosis not present

## 2020-06-08 DIAGNOSIS — R0602 Shortness of breath: Secondary | ICD-10-CM | POA: Diagnosis not present

## 2020-06-08 DIAGNOSIS — C7951 Secondary malignant neoplasm of bone: Secondary | ICD-10-CM | POA: Diagnosis not present

## 2020-06-08 DIAGNOSIS — Z86711 Personal history of pulmonary embolism: Secondary | ICD-10-CM | POA: Diagnosis not present

## 2020-06-08 DIAGNOSIS — C3490 Malignant neoplasm of unspecified part of unspecified bronchus or lung: Secondary | ICD-10-CM | POA: Diagnosis not present

## 2020-06-08 DIAGNOSIS — C801 Malignant (primary) neoplasm, unspecified: Secondary | ICD-10-CM | POA: Diagnosis not present

## 2020-06-08 DIAGNOSIS — M25559 Pain in unspecified hip: Secondary | ICD-10-CM | POA: Diagnosis not present

## 2020-06-08 DIAGNOSIS — I270 Primary pulmonary hypertension: Secondary | ICD-10-CM | POA: Diagnosis not present

## 2020-06-08 DIAGNOSIS — K7689 Other specified diseases of liver: Secondary | ICD-10-CM | POA: Diagnosis not present

## 2020-06-08 DIAGNOSIS — M542 Cervicalgia: Secondary | ICD-10-CM | POA: Diagnosis not present

## 2020-06-08 DIAGNOSIS — Z79899 Other long term (current) drug therapy: Secondary | ICD-10-CM | POA: Diagnosis not present

## 2020-06-08 DIAGNOSIS — Z86718 Personal history of other venous thrombosis and embolism: Secondary | ICD-10-CM | POA: Diagnosis not present

## 2020-06-08 DIAGNOSIS — R932 Abnormal findings on diagnostic imaging of liver and biliary tract: Secondary | ICD-10-CM | POA: Diagnosis not present

## 2020-06-08 DIAGNOSIS — Z7901 Long term (current) use of anticoagulants: Secondary | ICD-10-CM | POA: Diagnosis not present

## 2020-06-08 DIAGNOSIS — M79652 Pain in left thigh: Secondary | ICD-10-CM | POA: Diagnosis not present

## 2020-06-08 DIAGNOSIS — C3411 Malignant neoplasm of upper lobe, right bronchus or lung: Secondary | ICD-10-CM | POA: Diagnosis not present

## 2020-06-12 ENCOUNTER — Ambulatory Visit: Payer: Medicare Other

## 2020-06-12 ENCOUNTER — Inpatient Hospital Stay: Payer: Medicare Other | Attending: Hematology

## 2020-06-12 ENCOUNTER — Inpatient Hospital Stay: Payer: Medicare Other | Admitting: Hematology & Oncology

## 2020-06-26 ENCOUNTER — Telehealth: Payer: Self-pay | Admitting: Family Medicine

## 2020-06-26 NOTE — Telephone Encounter (Signed)
Left message with pt daughter for patient to call back and schedule Medicare Annual Wellness Visit (AWV) either virtually/audio only OR in office. Whatever the patients preference is.  Last AWV 06/25/19; please schedule at anytime with [facility] at [facility].  This should be a 45 minute visit.

## 2020-06-29 DIAGNOSIS — M5136 Other intervertebral disc degeneration, lumbar region: Secondary | ICD-10-CM | POA: Diagnosis not present

## 2020-06-29 DIAGNOSIS — M25552 Pain in left hip: Secondary | ICD-10-CM | POA: Diagnosis not present

## 2020-06-29 DIAGNOSIS — M549 Dorsalgia, unspecified: Secondary | ICD-10-CM | POA: Diagnosis not present

## 2020-06-29 DIAGNOSIS — Z79899 Other long term (current) drug therapy: Secondary | ICD-10-CM | POA: Diagnosis not present

## 2020-06-29 DIAGNOSIS — Z7901 Long term (current) use of anticoagulants: Secondary | ICD-10-CM | POA: Diagnosis not present

## 2020-06-29 DIAGNOSIS — M25511 Pain in right shoulder: Secondary | ICD-10-CM | POA: Diagnosis not present

## 2020-06-29 DIAGNOSIS — R32 Unspecified urinary incontinence: Secondary | ICD-10-CM | POA: Diagnosis not present

## 2020-06-29 DIAGNOSIS — N183 Chronic kidney disease, stage 3 unspecified: Secondary | ICD-10-CM | POA: Diagnosis not present

## 2020-06-29 DIAGNOSIS — Z86711 Personal history of pulmonary embolism: Secondary | ICD-10-CM | POA: Diagnosis not present

## 2020-06-29 DIAGNOSIS — M2548 Effusion, other site: Secondary | ICD-10-CM | POA: Diagnosis not present

## 2020-06-29 DIAGNOSIS — G629 Polyneuropathy, unspecified: Secondary | ICD-10-CM | POA: Diagnosis not present

## 2020-06-29 DIAGNOSIS — R634 Abnormal weight loss: Secondary | ICD-10-CM | POA: Diagnosis not present

## 2020-06-29 DIAGNOSIS — C3411 Malignant neoplasm of upper lobe, right bronchus or lung: Secondary | ICD-10-CM | POA: Diagnosis not present

## 2020-06-29 DIAGNOSIS — Z6824 Body mass index (BMI) 24.0-24.9, adult: Secondary | ICD-10-CM | POA: Diagnosis not present

## 2020-06-29 DIAGNOSIS — F329 Major depressive disorder, single episode, unspecified: Secondary | ICD-10-CM | POA: Diagnosis not present

## 2020-06-29 DIAGNOSIS — J449 Chronic obstructive pulmonary disease, unspecified: Secondary | ICD-10-CM | POA: Diagnosis not present

## 2020-06-29 DIAGNOSIS — R252 Cramp and spasm: Secondary | ICD-10-CM | POA: Diagnosis not present

## 2020-06-29 DIAGNOSIS — D638 Anemia in other chronic diseases classified elsewhere: Secondary | ICD-10-CM | POA: Diagnosis not present

## 2020-06-29 DIAGNOSIS — R1310 Dysphagia, unspecified: Secondary | ICD-10-CM | POA: Diagnosis not present

## 2020-06-29 DIAGNOSIS — Z87891 Personal history of nicotine dependence: Secondary | ICD-10-CM | POA: Diagnosis not present

## 2020-06-29 DIAGNOSIS — I129 Hypertensive chronic kidney disease with stage 1 through stage 4 chronic kidney disease, or unspecified chronic kidney disease: Secondary | ICD-10-CM | POA: Diagnosis not present

## 2020-06-29 DIAGNOSIS — R630 Anorexia: Secondary | ICD-10-CM | POA: Diagnosis not present

## 2020-06-29 DIAGNOSIS — R140 Abdominal distension (gaseous): Secondary | ICD-10-CM | POA: Diagnosis not present

## 2020-06-29 DIAGNOSIS — I2510 Atherosclerotic heart disease of native coronary artery without angina pectoris: Secondary | ICD-10-CM | POA: Diagnosis not present

## 2020-06-29 DIAGNOSIS — R208 Other disturbances of skin sensation: Secondary | ICD-10-CM | POA: Diagnosis not present

## 2020-06-29 DIAGNOSIS — M160 Bilateral primary osteoarthritis of hip: Secondary | ICD-10-CM | POA: Diagnosis not present

## 2020-06-29 DIAGNOSIS — C7951 Secondary malignant neoplasm of bone: Secondary | ICD-10-CM | POA: Diagnosis not present

## 2020-06-29 DIAGNOSIS — G959 Disease of spinal cord, unspecified: Secondary | ICD-10-CM | POA: Diagnosis not present

## 2020-06-29 DIAGNOSIS — M545 Low back pain: Secondary | ICD-10-CM | POA: Diagnosis not present

## 2020-06-29 DIAGNOSIS — M4800 Spinal stenosis, site unspecified: Secondary | ICD-10-CM | POA: Diagnosis not present

## 2020-06-29 DIAGNOSIS — M898X5 Other specified disorders of bone, thigh: Secondary | ICD-10-CM | POA: Diagnosis not present

## 2020-06-29 DIAGNOSIS — M48061 Spinal stenosis, lumbar region without neurogenic claudication: Secondary | ICD-10-CM | POA: Diagnosis not present

## 2020-06-29 DIAGNOSIS — M898X9 Other specified disorders of bone, unspecified site: Secondary | ICD-10-CM | POA: Diagnosis not present

## 2020-06-29 DIAGNOSIS — I2699 Other pulmonary embolism without acute cor pulmonale: Secondary | ICD-10-CM | POA: Diagnosis not present

## 2020-06-29 DIAGNOSIS — M79652 Pain in left thigh: Secondary | ICD-10-CM | POA: Diagnosis not present

## 2020-06-29 DIAGNOSIS — M25512 Pain in left shoulder: Secondary | ICD-10-CM | POA: Diagnosis not present

## 2020-06-29 DIAGNOSIS — K7689 Other specified diseases of liver: Secondary | ICD-10-CM | POA: Diagnosis not present

## 2020-06-29 DIAGNOSIS — E559 Vitamin D deficiency, unspecified: Secondary | ICD-10-CM | POA: Diagnosis not present

## 2020-06-30 DIAGNOSIS — Z79899 Other long term (current) drug therapy: Secondary | ICD-10-CM | POA: Diagnosis not present

## 2020-06-30 DIAGNOSIS — C3411 Malignant neoplasm of upper lobe, right bronchus or lung: Secondary | ICD-10-CM | POA: Diagnosis not present

## 2020-06-30 DIAGNOSIS — Z87891 Personal history of nicotine dependence: Secondary | ICD-10-CM | POA: Diagnosis not present

## 2020-06-30 DIAGNOSIS — G629 Polyneuropathy, unspecified: Secondary | ICD-10-CM | POA: Diagnosis not present

## 2020-06-30 DIAGNOSIS — M898X5 Other specified disorders of bone, thigh: Secondary | ICD-10-CM | POA: Diagnosis not present

## 2020-06-30 DIAGNOSIS — R1310 Dysphagia, unspecified: Secondary | ICD-10-CM | POA: Diagnosis not present

## 2020-06-30 DIAGNOSIS — F329 Major depressive disorder, single episode, unspecified: Secondary | ICD-10-CM | POA: Diagnosis not present

## 2020-06-30 DIAGNOSIS — M545 Low back pain: Secondary | ICD-10-CM | POA: Diagnosis not present

## 2020-06-30 DIAGNOSIS — C7951 Secondary malignant neoplasm of bone: Secondary | ICD-10-CM | POA: Diagnosis not present

## 2020-06-30 DIAGNOSIS — G893 Neoplasm related pain (acute) (chronic): Secondary | ICD-10-CM | POA: Diagnosis not present

## 2020-07-07 DIAGNOSIS — Z510 Encounter for antineoplastic radiation therapy: Secondary | ICD-10-CM | POA: Diagnosis not present

## 2020-07-07 DIAGNOSIS — C3411 Malignant neoplasm of upper lobe, right bronchus or lung: Secondary | ICD-10-CM | POA: Diagnosis not present

## 2020-07-07 DIAGNOSIS — C3490 Malignant neoplasm of unspecified part of unspecified bronchus or lung: Secondary | ICD-10-CM | POA: Diagnosis not present

## 2020-07-07 DIAGNOSIS — C7951 Secondary malignant neoplasm of bone: Secondary | ICD-10-CM | POA: Diagnosis not present

## 2020-07-12 ENCOUNTER — Other Ambulatory Visit: Payer: Self-pay | Admitting: Family Medicine

## 2020-07-23 DIAGNOSIS — C3490 Malignant neoplasm of unspecified part of unspecified bronchus or lung: Secondary | ICD-10-CM | POA: Diagnosis not present

## 2020-07-23 DIAGNOSIS — C7951 Secondary malignant neoplasm of bone: Secondary | ICD-10-CM | POA: Diagnosis not present

## 2020-07-25 DIAGNOSIS — B9620 Unspecified Escherichia coli [E. coli] as the cause of diseases classified elsewhere: Secondary | ICD-10-CM | POA: Diagnosis not present

## 2020-07-25 DIAGNOSIS — F0390 Unspecified dementia without behavioral disturbance: Secondary | ICD-10-CM | POA: Diagnosis present

## 2020-07-25 DIAGNOSIS — Z7901 Long term (current) use of anticoagulants: Secondary | ICD-10-CM | POA: Diagnosis not present

## 2020-07-25 DIAGNOSIS — R6520 Severe sepsis without septic shock: Secondary | ICD-10-CM | POA: Diagnosis not present

## 2020-07-25 DIAGNOSIS — N1330 Unspecified hydronephrosis: Secondary | ICD-10-CM | POA: Diagnosis not present

## 2020-07-25 DIAGNOSIS — I10 Essential (primary) hypertension: Secondary | ICD-10-CM | POA: Diagnosis not present

## 2020-07-25 DIAGNOSIS — F419 Anxiety disorder, unspecified: Secondary | ICD-10-CM | POA: Diagnosis present

## 2020-07-25 DIAGNOSIS — I509 Heart failure, unspecified: Secondary | ICD-10-CM | POA: Diagnosis not present

## 2020-07-25 DIAGNOSIS — N3001 Acute cystitis with hematuria: Secondary | ICD-10-CM | POA: Diagnosis present

## 2020-07-25 DIAGNOSIS — R4182 Altered mental status, unspecified: Secondary | ICD-10-CM | POA: Diagnosis not present

## 2020-07-25 DIAGNOSIS — R7989 Other specified abnormal findings of blood chemistry: Secondary | ICD-10-CM | POA: Diagnosis not present

## 2020-07-25 DIAGNOSIS — R0602 Shortness of breath: Secondary | ICD-10-CM | POA: Diagnosis not present

## 2020-07-25 DIAGNOSIS — J189 Pneumonia, unspecified organism: Secondary | ICD-10-CM | POA: Diagnosis not present

## 2020-07-25 DIAGNOSIS — R918 Other nonspecific abnormal finding of lung field: Secondary | ICD-10-CM | POA: Diagnosis not present

## 2020-07-25 DIAGNOSIS — N179 Acute kidney failure, unspecified: Secondary | ICD-10-CM | POA: Diagnosis not present

## 2020-07-25 DIAGNOSIS — D509 Iron deficiency anemia, unspecified: Secondary | ICD-10-CM | POA: Diagnosis present

## 2020-07-25 DIAGNOSIS — C3490 Malignant neoplasm of unspecified part of unspecified bronchus or lung: Secondary | ICD-10-CM | POA: Diagnosis present

## 2020-07-25 DIAGNOSIS — R7881 Bacteremia: Secondary | ICD-10-CM | POA: Diagnosis not present

## 2020-07-25 DIAGNOSIS — Z20822 Contact with and (suspected) exposure to covid-19: Secondary | ICD-10-CM | POA: Diagnosis not present

## 2020-07-25 DIAGNOSIS — A419 Sepsis, unspecified organism: Secondary | ICD-10-CM | POA: Diagnosis not present

## 2020-07-25 DIAGNOSIS — G9341 Metabolic encephalopathy: Secondary | ICD-10-CM | POA: Diagnosis present

## 2020-07-25 DIAGNOSIS — Z781 Physical restraint status: Secondary | ICD-10-CM | POA: Diagnosis not present

## 2020-07-25 DIAGNOSIS — C7951 Secondary malignant neoplasm of bone: Secondary | ICD-10-CM | POA: Diagnosis not present

## 2020-07-25 DIAGNOSIS — B974 Respiratory syncytial virus as the cause of diseases classified elsewhere: Secondary | ICD-10-CM | POA: Diagnosis not present

## 2020-07-25 DIAGNOSIS — R000 Tachycardia, unspecified: Secondary | ICD-10-CM | POA: Diagnosis not present

## 2020-07-25 DIAGNOSIS — Z86718 Personal history of other venous thrombosis and embolism: Secondary | ICD-10-CM | POA: Diagnosis not present

## 2020-07-25 DIAGNOSIS — J9601 Acute respiratory failure with hypoxia: Secondary | ICD-10-CM | POA: Diagnosis present

## 2020-07-25 DIAGNOSIS — G4733 Obstructive sleep apnea (adult) (pediatric): Secondary | ICD-10-CM | POA: Diagnosis present

## 2020-07-25 DIAGNOSIS — A4151 Sepsis due to Escherichia coli [E. coli]: Secondary | ICD-10-CM | POA: Diagnosis not present

## 2020-07-25 DIAGNOSIS — R410 Disorientation, unspecified: Secondary | ICD-10-CM | POA: Diagnosis not present

## 2020-07-25 DIAGNOSIS — Z85118 Personal history of other malignant neoplasm of bronchus and lung: Secondary | ICD-10-CM | POA: Diagnosis not present

## 2020-07-25 DIAGNOSIS — E871 Hypo-osmolality and hyponatremia: Secondary | ICD-10-CM | POA: Diagnosis not present

## 2020-08-03 DIAGNOSIS — Z510 Encounter for antineoplastic radiation therapy: Secondary | ICD-10-CM | POA: Diagnosis not present

## 2020-08-03 DIAGNOSIS — C7951 Secondary malignant neoplasm of bone: Secondary | ICD-10-CM | POA: Diagnosis not present

## 2020-08-03 DIAGNOSIS — C3490 Malignant neoplasm of unspecified part of unspecified bronchus or lung: Secondary | ICD-10-CM | POA: Diagnosis not present

## 2020-08-04 DIAGNOSIS — C7951 Secondary malignant neoplasm of bone: Secondary | ICD-10-CM | POA: Diagnosis not present

## 2020-08-04 DIAGNOSIS — C3490 Malignant neoplasm of unspecified part of unspecified bronchus or lung: Secondary | ICD-10-CM | POA: Diagnosis not present

## 2020-08-04 DIAGNOSIS — R102 Pelvic and perineal pain: Secondary | ICD-10-CM | POA: Diagnosis not present

## 2020-08-04 DIAGNOSIS — Z510 Encounter for antineoplastic radiation therapy: Secondary | ICD-10-CM | POA: Diagnosis not present

## 2020-08-04 DIAGNOSIS — M25552 Pain in left hip: Secondary | ICD-10-CM | POA: Diagnosis not present

## 2020-08-04 DIAGNOSIS — G893 Neoplasm related pain (acute) (chronic): Secondary | ICD-10-CM | POA: Diagnosis not present

## 2020-08-05 DIAGNOSIS — N3001 Acute cystitis with hematuria: Secondary | ICD-10-CM | POA: Diagnosis not present

## 2020-08-05 DIAGNOSIS — I10 Essential (primary) hypertension: Secondary | ICD-10-CM | POA: Diagnosis not present

## 2020-08-05 DIAGNOSIS — D509 Iron deficiency anemia, unspecified: Secondary | ICD-10-CM | POA: Diagnosis not present

## 2020-08-05 DIAGNOSIS — Z7902 Long term (current) use of antithrombotics/antiplatelets: Secondary | ICD-10-CM | POA: Diagnosis not present

## 2020-08-05 DIAGNOSIS — B9620 Unspecified Escherichia coli [E. coli] as the cause of diseases classified elsewhere: Secondary | ICD-10-CM | POA: Diagnosis not present

## 2020-08-05 DIAGNOSIS — C3491 Malignant neoplasm of unspecified part of right bronchus or lung: Secondary | ICD-10-CM | POA: Diagnosis not present

## 2020-08-05 DIAGNOSIS — J121 Respiratory syncytial virus pneumonia: Secondary | ICD-10-CM | POA: Diagnosis not present

## 2020-08-05 DIAGNOSIS — G4733 Obstructive sleep apnea (adult) (pediatric): Secondary | ICD-10-CM | POA: Diagnosis not present

## 2020-08-05 DIAGNOSIS — A419 Sepsis, unspecified organism: Secondary | ICD-10-CM | POA: Diagnosis not present

## 2020-08-05 DIAGNOSIS — Z86718 Personal history of other venous thrombosis and embolism: Secondary | ICD-10-CM | POA: Diagnosis not present

## 2020-08-05 DIAGNOSIS — F0390 Unspecified dementia without behavioral disturbance: Secondary | ICD-10-CM | POA: Diagnosis not present

## 2020-08-05 DIAGNOSIS — L89153 Pressure ulcer of sacral region, stage 3: Secondary | ICD-10-CM | POA: Diagnosis not present

## 2020-08-05 DIAGNOSIS — R6520 Severe sepsis without septic shock: Secondary | ICD-10-CM | POA: Diagnosis not present

## 2020-08-05 DIAGNOSIS — R7881 Bacteremia: Secondary | ICD-10-CM | POA: Diagnosis not present

## 2020-08-05 DIAGNOSIS — N179 Acute kidney failure, unspecified: Secondary | ICD-10-CM | POA: Diagnosis not present

## 2020-08-05 DIAGNOSIS — C7951 Secondary malignant neoplasm of bone: Secondary | ICD-10-CM | POA: Diagnosis not present

## 2020-08-07 DIAGNOSIS — R4182 Altered mental status, unspecified: Secondary | ICD-10-CM | POA: Diagnosis not present

## 2020-08-07 DIAGNOSIS — K6289 Other specified diseases of anus and rectum: Secondary | ICD-10-CM | POA: Diagnosis not present

## 2020-08-07 DIAGNOSIS — F0390 Unspecified dementia without behavioral disturbance: Secondary | ICD-10-CM | POA: Diagnosis present

## 2020-08-07 DIAGNOSIS — M19012 Primary osteoarthritis, left shoulder: Secondary | ICD-10-CM | POA: Diagnosis not present

## 2020-08-07 DIAGNOSIS — E876 Hypokalemia: Secondary | ICD-10-CM | POA: Diagnosis not present

## 2020-08-07 DIAGNOSIS — A419 Sepsis, unspecified organism: Secondary | ICD-10-CM | POA: Diagnosis not present

## 2020-08-07 DIAGNOSIS — R55 Syncope and collapse: Secondary | ICD-10-CM | POA: Diagnosis not present

## 2020-08-07 DIAGNOSIS — I10 Essential (primary) hypertension: Secondary | ICD-10-CM | POA: Diagnosis present

## 2020-08-07 DIAGNOSIS — Z79899 Other long term (current) drug therapy: Secondary | ICD-10-CM | POA: Diagnosis not present

## 2020-08-07 DIAGNOSIS — G928 Other toxic encephalopathy: Secondary | ICD-10-CM | POA: Diagnosis not present

## 2020-08-07 DIAGNOSIS — R000 Tachycardia, unspecified: Secondary | ICD-10-CM | POA: Diagnosis not present

## 2020-08-07 DIAGNOSIS — F419 Anxiety disorder, unspecified: Secondary | ICD-10-CM | POA: Diagnosis present

## 2020-08-07 DIAGNOSIS — G9340 Encephalopathy, unspecified: Secondary | ICD-10-CM | POA: Diagnosis not present

## 2020-08-07 DIAGNOSIS — M19011 Primary osteoarthritis, right shoulder: Secondary | ICD-10-CM | POA: Diagnosis not present

## 2020-08-07 DIAGNOSIS — T380X5A Adverse effect of glucocorticoids and synthetic analogues, initial encounter: Secondary | ICD-10-CM | POA: Diagnosis present

## 2020-08-07 DIAGNOSIS — R569 Unspecified convulsions: Secondary | ICD-10-CM | POA: Diagnosis present

## 2020-08-07 DIAGNOSIS — N390 Urinary tract infection, site not specified: Secondary | ICD-10-CM | POA: Diagnosis not present

## 2020-08-07 DIAGNOSIS — R6520 Severe sepsis without septic shock: Secondary | ICD-10-CM | POA: Diagnosis not present

## 2020-08-07 DIAGNOSIS — R5381 Other malaise: Secondary | ICD-10-CM | POA: Diagnosis not present

## 2020-08-07 DIAGNOSIS — N342 Other urethritis: Secondary | ICD-10-CM | POA: Diagnosis present

## 2020-08-07 DIAGNOSIS — R7881 Bacteremia: Secondary | ICD-10-CM | POA: Diagnosis not present

## 2020-08-07 DIAGNOSIS — N3289 Other specified disorders of bladder: Secondary | ICD-10-CM | POA: Diagnosis not present

## 2020-08-07 DIAGNOSIS — D509 Iron deficiency anemia, unspecified: Secondary | ICD-10-CM | POA: Diagnosis present

## 2020-08-07 DIAGNOSIS — Z8551 Personal history of malignant neoplasm of bladder: Secondary | ICD-10-CM | POA: Diagnosis not present

## 2020-08-07 DIAGNOSIS — Z85118 Personal history of other malignant neoplasm of bronchus and lung: Secondary | ICD-10-CM | POA: Diagnosis not present

## 2020-08-07 DIAGNOSIS — C3490 Malignant neoplasm of unspecified part of unspecified bronchus or lung: Secondary | ICD-10-CM | POA: Diagnosis present

## 2020-08-07 DIAGNOSIS — G4733 Obstructive sleep apnea (adult) (pediatric): Secondary | ICD-10-CM | POA: Diagnosis present

## 2020-08-07 DIAGNOSIS — Z86718 Personal history of other venous thrombosis and embolism: Secondary | ICD-10-CM | POA: Diagnosis not present

## 2020-08-07 DIAGNOSIS — Z791 Long term (current) use of non-steroidal anti-inflammatories (NSAID): Secondary | ICD-10-CM | POA: Diagnosis not present

## 2020-08-07 DIAGNOSIS — Z792 Long term (current) use of antibiotics: Secondary | ICD-10-CM | POA: Diagnosis not present

## 2020-08-07 DIAGNOSIS — Z20822 Contact with and (suspected) exposure to covid-19: Secondary | ICD-10-CM | POA: Diagnosis present

## 2020-08-07 DIAGNOSIS — M25519 Pain in unspecified shoulder: Secondary | ICD-10-CM | POA: Diagnosis not present

## 2020-08-07 DIAGNOSIS — Z7901 Long term (current) use of anticoagulants: Secondary | ICD-10-CM | POA: Diagnosis not present

## 2020-08-07 DIAGNOSIS — N179 Acute kidney failure, unspecified: Secondary | ICD-10-CM | POA: Diagnosis present

## 2020-08-07 DIAGNOSIS — N3001 Acute cystitis with hematuria: Secondary | ICD-10-CM | POA: Diagnosis not present

## 2020-08-07 DIAGNOSIS — R739 Hyperglycemia, unspecified: Secondary | ICD-10-CM | POA: Diagnosis present

## 2020-08-07 DIAGNOSIS — L98429 Non-pressure chronic ulcer of back with unspecified severity: Secondary | ICD-10-CM | POA: Diagnosis present

## 2020-08-07 DIAGNOSIS — R6510 Systemic inflammatory response syndrome (SIRS) of non-infectious origin without acute organ dysfunction: Secondary | ICD-10-CM | POA: Diagnosis not present

## 2020-08-08 DIAGNOSIS — R6510 Systemic inflammatory response syndrome (SIRS) of non-infectious origin without acute organ dysfunction: Secondary | ICD-10-CM | POA: Diagnosis not present

## 2020-08-08 DIAGNOSIS — C3490 Malignant neoplasm of unspecified part of unspecified bronchus or lung: Secondary | ICD-10-CM | POA: Diagnosis not present

## 2020-08-08 DIAGNOSIS — N3001 Acute cystitis with hematuria: Secondary | ICD-10-CM | POA: Diagnosis not present

## 2020-08-08 DIAGNOSIS — R55 Syncope and collapse: Secondary | ICD-10-CM | POA: Diagnosis not present

## 2020-08-08 DIAGNOSIS — R000 Tachycardia, unspecified: Secondary | ICD-10-CM | POA: Diagnosis not present

## 2020-08-08 DIAGNOSIS — R4182 Altered mental status, unspecified: Secondary | ICD-10-CM | POA: Diagnosis not present

## 2020-08-08 DIAGNOSIS — N179 Acute kidney failure, unspecified: Secondary | ICD-10-CM | POA: Diagnosis not present

## 2020-08-08 DIAGNOSIS — G9340 Encephalopathy, unspecified: Secondary | ICD-10-CM | POA: Diagnosis not present

## 2020-08-09 DIAGNOSIS — R000 Tachycardia, unspecified: Secondary | ICD-10-CM | POA: Diagnosis not present

## 2020-08-09 DIAGNOSIS — C3490 Malignant neoplasm of unspecified part of unspecified bronchus or lung: Secondary | ICD-10-CM | POA: Diagnosis not present

## 2020-08-09 DIAGNOSIS — R6510 Systemic inflammatory response syndrome (SIRS) of non-infectious origin without acute organ dysfunction: Secondary | ICD-10-CM | POA: Diagnosis not present

## 2020-08-09 DIAGNOSIS — R55 Syncope and collapse: Secondary | ICD-10-CM | POA: Diagnosis not present

## 2020-08-09 DIAGNOSIS — G9340 Encephalopathy, unspecified: Secondary | ICD-10-CM | POA: Diagnosis not present

## 2020-08-09 DIAGNOSIS — R4182 Altered mental status, unspecified: Secondary | ICD-10-CM | POA: Diagnosis not present

## 2020-08-10 DIAGNOSIS — R000 Tachycardia, unspecified: Secondary | ICD-10-CM | POA: Diagnosis not present

## 2020-08-10 DIAGNOSIS — E876 Hypokalemia: Secondary | ICD-10-CM | POA: Diagnosis not present

## 2020-08-10 DIAGNOSIS — N3001 Acute cystitis with hematuria: Secondary | ICD-10-CM | POA: Diagnosis not present

## 2020-08-10 DIAGNOSIS — R55 Syncope and collapse: Secondary | ICD-10-CM | POA: Diagnosis not present

## 2020-08-10 DIAGNOSIS — N390 Urinary tract infection, site not specified: Secondary | ICD-10-CM | POA: Diagnosis not present

## 2020-08-10 DIAGNOSIS — R6510 Systemic inflammatory response syndrome (SIRS) of non-infectious origin without acute organ dysfunction: Secondary | ICD-10-CM | POA: Diagnosis not present

## 2020-08-10 DIAGNOSIS — C3490 Malignant neoplasm of unspecified part of unspecified bronchus or lung: Secondary | ICD-10-CM | POA: Diagnosis not present

## 2020-08-10 DIAGNOSIS — G9340 Encephalopathy, unspecified: Secondary | ICD-10-CM | POA: Diagnosis not present

## 2020-08-11 DIAGNOSIS — C3490 Malignant neoplasm of unspecified part of unspecified bronchus or lung: Secondary | ICD-10-CM | POA: Diagnosis not present

## 2020-08-11 DIAGNOSIS — N3001 Acute cystitis with hematuria: Secondary | ICD-10-CM | POA: Diagnosis not present

## 2020-08-11 DIAGNOSIS — G928 Other toxic encephalopathy: Secondary | ICD-10-CM | POA: Diagnosis not present

## 2020-08-11 DIAGNOSIS — R000 Tachycardia, unspecified: Secondary | ICD-10-CM | POA: Diagnosis not present

## 2020-08-11 DIAGNOSIS — R5381 Other malaise: Secondary | ICD-10-CM | POA: Diagnosis not present

## 2020-08-11 DIAGNOSIS — R6510 Systemic inflammatory response syndrome (SIRS) of non-infectious origin without acute organ dysfunction: Secondary | ICD-10-CM | POA: Diagnosis not present

## 2020-08-11 DIAGNOSIS — E876 Hypokalemia: Secondary | ICD-10-CM | POA: Diagnosis not present

## 2020-08-12 DIAGNOSIS — A419 Sepsis, unspecified organism: Secondary | ICD-10-CM | POA: Diagnosis not present

## 2020-08-12 DIAGNOSIS — C3490 Malignant neoplasm of unspecified part of unspecified bronchus or lung: Secondary | ICD-10-CM | POA: Diagnosis not present

## 2020-08-12 DIAGNOSIS — R7881 Bacteremia: Secondary | ICD-10-CM | POA: Diagnosis not present

## 2020-08-12 DIAGNOSIS — K6289 Other specified diseases of anus and rectum: Secondary | ICD-10-CM | POA: Diagnosis not present

## 2020-08-12 DIAGNOSIS — N3289 Other specified disorders of bladder: Secondary | ICD-10-CM | POA: Diagnosis not present

## 2020-08-12 DIAGNOSIS — R000 Tachycardia, unspecified: Secondary | ICD-10-CM | POA: Diagnosis not present

## 2020-08-12 DIAGNOSIS — Z8551 Personal history of malignant neoplasm of bladder: Secondary | ICD-10-CM | POA: Diagnosis not present

## 2020-08-12 DIAGNOSIS — R5381 Other malaise: Secondary | ICD-10-CM | POA: Diagnosis not present

## 2020-08-12 DIAGNOSIS — N3001 Acute cystitis with hematuria: Secondary | ICD-10-CM | POA: Diagnosis not present

## 2020-08-13 DIAGNOSIS — A419 Sepsis, unspecified organism: Secondary | ICD-10-CM | POA: Diagnosis not present

## 2020-08-13 DIAGNOSIS — C3490 Malignant neoplasm of unspecified part of unspecified bronchus or lung: Secondary | ICD-10-CM | POA: Diagnosis not present

## 2020-08-13 DIAGNOSIS — R5381 Other malaise: Secondary | ICD-10-CM | POA: Diagnosis not present

## 2020-08-13 DIAGNOSIS — N3001 Acute cystitis with hematuria: Secondary | ICD-10-CM | POA: Diagnosis not present

## 2020-08-13 DIAGNOSIS — R000 Tachycardia, unspecified: Secondary | ICD-10-CM | POA: Diagnosis not present

## 2020-08-14 DIAGNOSIS — A419 Sepsis, unspecified organism: Secondary | ICD-10-CM | POA: Diagnosis not present

## 2020-08-14 DIAGNOSIS — R6520 Severe sepsis without septic shock: Secondary | ICD-10-CM | POA: Diagnosis not present

## 2020-08-14 DIAGNOSIS — N179 Acute kidney failure, unspecified: Secondary | ICD-10-CM | POA: Diagnosis not present

## 2020-08-14 DIAGNOSIS — R5381 Other malaise: Secondary | ICD-10-CM | POA: Diagnosis not present

## 2020-08-15 DIAGNOSIS — C3490 Malignant neoplasm of unspecified part of unspecified bronchus or lung: Secondary | ICD-10-CM | POA: Diagnosis not present

## 2020-08-15 DIAGNOSIS — R6520 Severe sepsis without septic shock: Secondary | ICD-10-CM | POA: Diagnosis not present

## 2020-08-15 DIAGNOSIS — N179 Acute kidney failure, unspecified: Secondary | ICD-10-CM | POA: Diagnosis not present

## 2020-08-15 DIAGNOSIS — N3001 Acute cystitis with hematuria: Secondary | ICD-10-CM | POA: Diagnosis not present

## 2020-08-15 DIAGNOSIS — N342 Other urethritis: Secondary | ICD-10-CM | POA: Diagnosis not present

## 2020-08-15 DIAGNOSIS — A419 Sepsis, unspecified organism: Secondary | ICD-10-CM | POA: Diagnosis not present

## 2020-08-15 DIAGNOSIS — G9340 Encephalopathy, unspecified: Secondary | ICD-10-CM | POA: Diagnosis not present

## 2020-08-15 DIAGNOSIS — E876 Hypokalemia: Secondary | ICD-10-CM | POA: Diagnosis not present

## 2020-08-16 DIAGNOSIS — C801 Malignant (primary) neoplasm, unspecified: Secondary | ICD-10-CM | POA: Diagnosis not present

## 2020-08-16 DIAGNOSIS — R9089 Other abnormal findings on diagnostic imaging of central nervous system: Secondary | ICD-10-CM | POA: Diagnosis not present

## 2020-08-16 DIAGNOSIS — Z86718 Personal history of other venous thrombosis and embolism: Secondary | ICD-10-CM | POA: Diagnosis not present

## 2020-08-16 DIAGNOSIS — N183 Chronic kidney disease, stage 3 unspecified: Secondary | ICD-10-CM | POA: Diagnosis present

## 2020-08-16 DIAGNOSIS — I2510 Atherosclerotic heart disease of native coronary artery without angina pectoris: Secondary | ICD-10-CM | POA: Diagnosis present

## 2020-08-16 DIAGNOSIS — D630 Anemia in neoplastic disease: Secondary | ICD-10-CM | POA: Diagnosis present

## 2020-08-16 DIAGNOSIS — I129 Hypertensive chronic kidney disease with stage 1 through stage 4 chronic kidney disease, or unspecified chronic kidney disease: Secondary | ICD-10-CM | POA: Diagnosis present

## 2020-08-16 DIAGNOSIS — Z20822 Contact with and (suspected) exposure to covid-19: Secondary | ICD-10-CM | POA: Diagnosis present

## 2020-08-16 DIAGNOSIS — Z86711 Personal history of pulmonary embolism: Secondary | ICD-10-CM | POA: Diagnosis not present

## 2020-08-16 DIAGNOSIS — Z87891 Personal history of nicotine dependence: Secondary | ICD-10-CM | POA: Diagnosis not present

## 2020-08-16 DIAGNOSIS — Z7901 Long term (current) use of anticoagulants: Secondary | ICD-10-CM | POA: Diagnosis not present

## 2020-08-16 DIAGNOSIS — R410 Disorientation, unspecified: Secondary | ICD-10-CM | POA: Diagnosis present

## 2020-08-16 DIAGNOSIS — L89152 Pressure ulcer of sacral region, stage 2: Secondary | ICD-10-CM | POA: Diagnosis present

## 2020-08-16 DIAGNOSIS — D649 Anemia, unspecified: Secondary | ICD-10-CM | POA: Diagnosis not present

## 2020-08-16 DIAGNOSIS — C3490 Malignant neoplasm of unspecified part of unspecified bronchus or lung: Secondary | ICD-10-CM | POA: Diagnosis not present

## 2020-08-16 DIAGNOSIS — G319 Degenerative disease of nervous system, unspecified: Secondary | ICD-10-CM | POA: Diagnosis not present

## 2020-08-16 DIAGNOSIS — R918 Other nonspecific abnormal finding of lung field: Secondary | ICD-10-CM | POA: Diagnosis not present

## 2020-08-16 DIAGNOSIS — Z8249 Family history of ischemic heart disease and other diseases of the circulatory system: Secondary | ICD-10-CM | POA: Diagnosis not present

## 2020-08-16 DIAGNOSIS — R6510 Systemic inflammatory response syndrome (SIRS) of non-infectious origin without acute organ dysfunction: Secondary | ICD-10-CM | POA: Diagnosis not present

## 2020-08-16 DIAGNOSIS — E440 Moderate protein-calorie malnutrition: Secondary | ICD-10-CM | POA: Diagnosis present

## 2020-08-16 DIAGNOSIS — E559 Vitamin D deficiency, unspecified: Secondary | ICD-10-CM | POA: Diagnosis present

## 2020-08-16 DIAGNOSIS — N390 Urinary tract infection, site not specified: Secondary | ICD-10-CM | POA: Diagnosis present

## 2020-08-16 DIAGNOSIS — A419 Sepsis, unspecified organism: Secondary | ICD-10-CM | POA: Diagnosis not present

## 2020-08-16 DIAGNOSIS — C7951 Secondary malignant neoplasm of bone: Secondary | ICD-10-CM | POA: Diagnosis not present

## 2020-08-16 DIAGNOSIS — R0602 Shortness of breath: Secondary | ICD-10-CM | POA: Diagnosis not present

## 2020-08-16 DIAGNOSIS — F32A Depression, unspecified: Secondary | ICD-10-CM | POA: Diagnosis present

## 2020-08-16 DIAGNOSIS — E871 Hypo-osmolality and hyponatremia: Secondary | ICD-10-CM | POA: Diagnosis not present

## 2020-08-16 DIAGNOSIS — Z6823 Body mass index (BMI) 23.0-23.9, adult: Secondary | ICD-10-CM | POA: Diagnosis not present

## 2020-08-16 DIAGNOSIS — R9082 White matter disease, unspecified: Secondary | ICD-10-CM | POA: Diagnosis not present

## 2020-08-16 DIAGNOSIS — M25552 Pain in left hip: Secondary | ICD-10-CM | POA: Diagnosis not present

## 2020-08-16 DIAGNOSIS — M1612 Unilateral primary osteoarthritis, left hip: Secondary | ICD-10-CM | POA: Diagnosis not present

## 2020-08-16 DIAGNOSIS — R4182 Altered mental status, unspecified: Secondary | ICD-10-CM | POA: Diagnosis not present

## 2020-08-17 DIAGNOSIS — A419 Sepsis, unspecified organism: Secondary | ICD-10-CM | POA: Diagnosis not present

## 2020-08-17 DIAGNOSIS — C3490 Malignant neoplasm of unspecified part of unspecified bronchus or lung: Secondary | ICD-10-CM | POA: Diagnosis not present

## 2020-08-21 DIAGNOSIS — R9082 White matter disease, unspecified: Secondary | ICD-10-CM | POA: Diagnosis not present

## 2020-08-21 DIAGNOSIS — G319 Degenerative disease of nervous system, unspecified: Secondary | ICD-10-CM | POA: Diagnosis not present

## 2020-08-21 DIAGNOSIS — R410 Disorientation, unspecified: Secondary | ICD-10-CM | POA: Diagnosis not present

## 2020-08-24 DIAGNOSIS — M542 Cervicalgia: Secondary | ICD-10-CM | POA: Diagnosis not present

## 2020-08-24 DIAGNOSIS — C3490 Malignant neoplasm of unspecified part of unspecified bronchus or lung: Secondary | ICD-10-CM | POA: Diagnosis not present

## 2020-08-24 DIAGNOSIS — G8929 Other chronic pain: Secondary | ICD-10-CM | POA: Diagnosis not present

## 2020-08-24 DIAGNOSIS — M25511 Pain in right shoulder: Secondary | ICD-10-CM | POA: Diagnosis not present

## 2020-08-24 DIAGNOSIS — R634 Abnormal weight loss: Secondary | ICD-10-CM | POA: Diagnosis not present

## 2020-08-24 DIAGNOSIS — C7951 Secondary malignant neoplasm of bone: Secondary | ICD-10-CM | POA: Diagnosis not present

## 2020-09-04 DIAGNOSIS — R7881 Bacteremia: Secondary | ICD-10-CM | POA: Diagnosis not present

## 2020-09-04 DIAGNOSIS — Z7902 Long term (current) use of antithrombotics/antiplatelets: Secondary | ICD-10-CM | POA: Diagnosis not present

## 2020-09-04 DIAGNOSIS — J121 Respiratory syncytial virus pneumonia: Secondary | ICD-10-CM | POA: Diagnosis not present

## 2020-09-04 DIAGNOSIS — L89153 Pressure ulcer of sacral region, stage 3: Secondary | ICD-10-CM | POA: Diagnosis not present

## 2020-09-04 DIAGNOSIS — G4733 Obstructive sleep apnea (adult) (pediatric): Secondary | ICD-10-CM | POA: Diagnosis not present

## 2020-09-04 DIAGNOSIS — C3491 Malignant neoplasm of unspecified part of right bronchus or lung: Secondary | ICD-10-CM | POA: Diagnosis not present

## 2020-09-04 DIAGNOSIS — D509 Iron deficiency anemia, unspecified: Secondary | ICD-10-CM | POA: Diagnosis not present

## 2020-09-04 DIAGNOSIS — F0390 Unspecified dementia without behavioral disturbance: Secondary | ICD-10-CM | POA: Diagnosis not present

## 2020-09-04 DIAGNOSIS — B9620 Unspecified Escherichia coli [E. coli] as the cause of diseases classified elsewhere: Secondary | ICD-10-CM | POA: Diagnosis not present

## 2020-09-04 DIAGNOSIS — N3001 Acute cystitis with hematuria: Secondary | ICD-10-CM | POA: Diagnosis not present

## 2020-09-04 DIAGNOSIS — I10 Essential (primary) hypertension: Secondary | ICD-10-CM | POA: Diagnosis not present

## 2020-09-04 DIAGNOSIS — Z86718 Personal history of other venous thrombosis and embolism: Secondary | ICD-10-CM | POA: Diagnosis not present

## 2020-09-04 DIAGNOSIS — C7951 Secondary malignant neoplasm of bone: Secondary | ICD-10-CM | POA: Diagnosis not present

## 2020-09-05 DIAGNOSIS — C7951 Secondary malignant neoplasm of bone: Secondary | ICD-10-CM | POA: Diagnosis not present

## 2020-09-05 DIAGNOSIS — M25512 Pain in left shoulder: Secondary | ICD-10-CM | POA: Diagnosis not present

## 2020-09-05 DIAGNOSIS — G8929 Other chronic pain: Secondary | ICD-10-CM | POA: Diagnosis not present

## 2020-09-05 DIAGNOSIS — C3490 Malignant neoplasm of unspecified part of unspecified bronchus or lung: Secondary | ICD-10-CM | POA: Diagnosis not present

## 2020-09-05 DIAGNOSIS — M545 Low back pain, unspecified: Secondary | ICD-10-CM | POA: Diagnosis not present

## 2020-10-12 DIAGNOSIS — R410 Disorientation, unspecified: Secondary | ICD-10-CM | POA: Diagnosis not present

## 2020-10-12 DIAGNOSIS — M549 Dorsalgia, unspecified: Secondary | ICD-10-CM | POA: Diagnosis not present

## 2020-10-12 DIAGNOSIS — M79605 Pain in left leg: Secondary | ICD-10-CM | POA: Diagnosis not present

## 2020-10-12 DIAGNOSIS — G893 Neoplasm related pain (acute) (chronic): Secondary | ICD-10-CM | POA: Diagnosis not present

## 2020-10-12 DIAGNOSIS — F419 Anxiety disorder, unspecified: Secondary | ICD-10-CM | POA: Diagnosis not present

## 2020-10-12 DIAGNOSIS — R9082 White matter disease, unspecified: Secondary | ICD-10-CM | POA: Diagnosis not present

## 2020-10-12 DIAGNOSIS — C3490 Malignant neoplasm of unspecified part of unspecified bronchus or lung: Secondary | ICD-10-CM | POA: Diagnosis not present

## 2020-10-12 DIAGNOSIS — M5416 Radiculopathy, lumbar region: Secondary | ICD-10-CM | POA: Diagnosis not present

## 2020-10-12 DIAGNOSIS — M545 Low back pain, unspecified: Secondary | ICD-10-CM | POA: Diagnosis not present

## 2020-10-13 DIAGNOSIS — D649 Anemia, unspecified: Secondary | ICD-10-CM | POA: Diagnosis not present

## 2020-10-13 DIAGNOSIS — N390 Urinary tract infection, site not specified: Secondary | ICD-10-CM | POA: Diagnosis not present

## 2020-10-13 DIAGNOSIS — R6520 Severe sepsis without septic shock: Secondary | ICD-10-CM | POA: Diagnosis not present

## 2020-10-13 DIAGNOSIS — J949 Pleural condition, unspecified: Secondary | ICD-10-CM | POA: Diagnosis not present

## 2020-10-13 DIAGNOSIS — J984 Other disorders of lung: Secondary | ICD-10-CM | POA: Diagnosis not present

## 2020-10-13 DIAGNOSIS — A419 Sepsis, unspecified organism: Secondary | ICD-10-CM | POA: Diagnosis not present

## 2020-10-13 DIAGNOSIS — J9601 Acute respiratory failure with hypoxia: Secondary | ICD-10-CM | POA: Diagnosis not present

## 2020-10-13 DIAGNOSIS — R0902 Hypoxemia: Secondary | ICD-10-CM | POA: Diagnosis not present

## 2020-10-13 DIAGNOSIS — C3490 Malignant neoplasm of unspecified part of unspecified bronchus or lung: Secondary | ICD-10-CM | POA: Diagnosis not present

## 2020-10-13 DIAGNOSIS — R590 Localized enlarged lymph nodes: Secondary | ICD-10-CM | POA: Diagnosis not present

## 2020-10-13 DIAGNOSIS — R9431 Abnormal electrocardiogram [ECG] [EKG]: Secondary | ICD-10-CM | POA: Diagnosis not present

## 2020-10-13 DIAGNOSIS — R4182 Altered mental status, unspecified: Secondary | ICD-10-CM | POA: Diagnosis not present

## 2020-10-14 DIAGNOSIS — J9601 Acute respiratory failure with hypoxia: Secondary | ICD-10-CM | POA: Diagnosis not present

## 2020-10-14 DIAGNOSIS — J90 Pleural effusion, not elsewhere classified: Secondary | ICD-10-CM | POA: Diagnosis not present

## 2020-10-14 DIAGNOSIS — I493 Ventricular premature depolarization: Secondary | ICD-10-CM | POA: Diagnosis not present

## 2020-10-14 DIAGNOSIS — J9811 Atelectasis: Secondary | ICD-10-CM | POA: Diagnosis not present

## 2020-10-14 DIAGNOSIS — C3490 Malignant neoplasm of unspecified part of unspecified bronchus or lung: Secondary | ICD-10-CM | POA: Diagnosis not present

## 2020-10-14 DIAGNOSIS — R0603 Acute respiratory distress: Secondary | ICD-10-CM | POA: Diagnosis not present

## 2020-10-14 DIAGNOSIS — A419 Sepsis, unspecified organism: Secondary | ICD-10-CM | POA: Diagnosis not present

## 2020-10-14 DIAGNOSIS — R4182 Altered mental status, unspecified: Secondary | ICD-10-CM | POA: Diagnosis not present

## 2020-10-14 DIAGNOSIS — N390 Urinary tract infection, site not specified: Secondary | ICD-10-CM | POA: Diagnosis not present

## 2020-10-14 DIAGNOSIS — R9431 Abnormal electrocardiogram [ECG] [EKG]: Secondary | ICD-10-CM | POA: Diagnosis not present

## 2020-10-14 DIAGNOSIS — R000 Tachycardia, unspecified: Secondary | ICD-10-CM | POA: Diagnosis not present

## 2020-10-15 DIAGNOSIS — J9601 Acute respiratory failure with hypoxia: Secondary | ICD-10-CM | POA: Diagnosis not present

## 2020-10-15 DIAGNOSIS — N390 Urinary tract infection, site not specified: Secondary | ICD-10-CM | POA: Diagnosis not present

## 2020-10-15 DIAGNOSIS — J984 Other disorders of lung: Secondary | ICD-10-CM | POA: Diagnosis not present

## 2020-10-15 DIAGNOSIS — C3490 Malignant neoplasm of unspecified part of unspecified bronchus or lung: Secondary | ICD-10-CM | POA: Diagnosis not present

## 2020-10-15 DIAGNOSIS — J90 Pleural effusion, not elsewhere classified: Secondary | ICD-10-CM | POA: Diagnosis not present

## 2020-10-15 DIAGNOSIS — A419 Sepsis, unspecified organism: Secondary | ICD-10-CM | POA: Diagnosis not present

## 2020-10-15 DIAGNOSIS — R4182 Altered mental status, unspecified: Secondary | ICD-10-CM | POA: Diagnosis not present

## 2020-10-15 DIAGNOSIS — J9690 Respiratory failure, unspecified, unspecified whether with hypoxia or hypercapnia: Secondary | ICD-10-CM | POA: Diagnosis not present

## 2020-10-17 DIAGNOSIS — R0682 Tachypnea, not elsewhere classified: Secondary | ICD-10-CM | POA: Diagnosis not present

## 2020-10-17 DIAGNOSIS — R410 Disorientation, unspecified: Secondary | ICD-10-CM | POA: Diagnosis not present

## 2020-10-17 DIAGNOSIS — D72829 Elevated white blood cell count, unspecified: Secondary | ICD-10-CM | POA: Diagnosis not present

## 2020-10-17 DIAGNOSIS — R9431 Abnormal electrocardiogram [ECG] [EKG]: Secondary | ICD-10-CM | POA: Diagnosis not present

## 2020-10-17 DIAGNOSIS — I471 Supraventricular tachycardia: Secondary | ICD-10-CM | POA: Diagnosis not present

## 2020-10-17 DIAGNOSIS — R000 Tachycardia, unspecified: Secondary | ICD-10-CM | POA: Diagnosis not present

## 2020-10-20 DIAGNOSIS — C787 Secondary malignant neoplasm of liver and intrahepatic bile duct: Secondary | ICD-10-CM | POA: Diagnosis not present

## 2020-10-20 DIAGNOSIS — C3490 Malignant neoplasm of unspecified part of unspecified bronchus or lung: Secondary | ICD-10-CM | POA: Diagnosis not present

## 2020-10-20 DIAGNOSIS — R590 Localized enlarged lymph nodes: Secondary | ICD-10-CM | POA: Diagnosis not present

## 2020-10-20 DIAGNOSIS — I82621 Acute embolism and thrombosis of deep veins of right upper extremity: Secondary | ICD-10-CM | POA: Diagnosis not present

## 2020-10-20 DIAGNOSIS — I82C11 Acute embolism and thrombosis of right internal jugular vein: Secondary | ICD-10-CM | POA: Diagnosis not present

## 2020-10-20 DIAGNOSIS — C7951 Secondary malignant neoplasm of bone: Secondary | ICD-10-CM | POA: Diagnosis not present

## 2020-10-20 DIAGNOSIS — R109 Unspecified abdominal pain: Secondary | ICD-10-CM | POA: Diagnosis not present

## 2020-10-24 DIAGNOSIS — C3490 Malignant neoplasm of unspecified part of unspecified bronchus or lung: Secondary | ICD-10-CM | POA: Diagnosis not present

## 2020-10-24 DIAGNOSIS — Z452 Encounter for adjustment and management of vascular access device: Secondary | ICD-10-CM | POA: Diagnosis not present

## 2020-10-24 DIAGNOSIS — Z0389 Encounter for observation for other suspected diseases and conditions ruled out: Secondary | ICD-10-CM | POA: Diagnosis not present

## 2020-10-27 DIAGNOSIS — E638 Other specified nutritional deficiencies: Secondary | ICD-10-CM | POA: Diagnosis not present

## 2020-10-27 DIAGNOSIS — Z431 Encounter for attention to gastrostomy: Secondary | ICD-10-CM | POA: Diagnosis not present

## 2020-10-27 DIAGNOSIS — C4492 Squamous cell carcinoma of skin, unspecified: Secondary | ICD-10-CM | POA: Diagnosis not present

## 2020-10-28 DIAGNOSIS — C7951 Secondary malignant neoplasm of bone: Secondary | ICD-10-CM | POA: Diagnosis not present

## 2020-10-28 DIAGNOSIS — I10 Essential (primary) hypertension: Secondary | ICD-10-CM | POA: Diagnosis not present

## 2020-10-28 DIAGNOSIS — C3490 Malignant neoplasm of unspecified part of unspecified bronchus or lung: Secondary | ICD-10-CM | POA: Diagnosis not present

## 2020-11-11 DEATH — deceased

## 2021-02-06 ENCOUNTER — Other Ambulatory Visit (HOSPITAL_COMMUNITY): Payer: Self-pay
